# Patient Record
Sex: Female | Born: 1955 | Race: White | Hispanic: No | Smoking: Former smoker
Health system: Southern US, Community
[De-identification: ages and names within clinical notes are randomized; demographics above are authoritative.]

## PROBLEM LIST (undated history)

## (undated) DIAGNOSIS — N390 Urinary tract infection, site not specified: Secondary | ICD-10-CM

## (undated) DIAGNOSIS — E039 Hypothyroidism, unspecified: Secondary | ICD-10-CM

## (undated) DIAGNOSIS — F419 Anxiety disorder, unspecified: Secondary | ICD-10-CM

## (undated) DIAGNOSIS — F319 Bipolar disorder, unspecified: Secondary | ICD-10-CM

## (undated) DIAGNOSIS — F32A Depression, unspecified: Secondary | ICD-10-CM

## (undated) DIAGNOSIS — Z973 Presence of spectacles and contact lenses: Secondary | ICD-10-CM

## (undated) DIAGNOSIS — I00 Rheumatic fever without heart involvement: Secondary | ICD-10-CM

## (undated) DIAGNOSIS — F329 Major depressive disorder, single episode, unspecified: Secondary | ICD-10-CM

## (undated) DIAGNOSIS — J449 Chronic obstructive pulmonary disease, unspecified: Secondary | ICD-10-CM

## (undated) HISTORY — DX: Hypothyroidism, unspecified: E03.9

## (undated) HISTORY — DX: Presence of spectacles and contact lenses: Z97.3

## (undated) HISTORY — DX: Bipolar disorder, unspecified: F31.9

## (undated) HISTORY — DX: Anxiety disorder, unspecified: F41.9

## (undated) HISTORY — DX: Chronic obstructive pulmonary disease, unspecified: J44.9

## (undated) HISTORY — DX: Major depressive disorder, single episode, unspecified: F32.9

## (undated) HISTORY — DX: Rheumatic fever without heart involvement: I00

## (undated) HISTORY — DX: Urinary tract infection, site not specified: N39.0

## (undated) HISTORY — PX: UTERINE FIBROID SURGERY: SHX826

## (undated) HISTORY — PX: APPENDECTOMY: SHX54

## (undated) HISTORY — DX: Depression, unspecified: F32.A

## (undated) HISTORY — PX: ABDOMINAL HYSTERECTOMY: SHX81

---

## 1997-07-07 ENCOUNTER — Other Ambulatory Visit: Admission: RE | Admit: 1997-07-07 | Discharge: 1997-07-07 | Payer: Self-pay | Admitting: Obstetrics and Gynecology

## 1997-08-11 ENCOUNTER — Other Ambulatory Visit: Admission: RE | Admit: 1997-08-11 | Discharge: 1997-08-11 | Payer: Self-pay | Admitting: Obstetrics and Gynecology

## 1997-08-25 ENCOUNTER — Inpatient Hospital Stay (HOSPITAL_COMMUNITY): Admission: RE | Admit: 1997-08-25 | Discharge: 1997-08-26 | Payer: Self-pay | Admitting: Obstetrics and Gynecology

## 1999-03-07 ENCOUNTER — Other Ambulatory Visit: Admission: RE | Admit: 1999-03-07 | Discharge: 1999-03-07 | Payer: Self-pay | Admitting: Obstetrics and Gynecology

## 1999-05-09 ENCOUNTER — Encounter: Payer: Self-pay | Admitting: Urology

## 1999-05-10 ENCOUNTER — Observation Stay (HOSPITAL_COMMUNITY): Admission: RE | Admit: 1999-05-10 | Discharge: 1999-05-11 | Payer: Self-pay | Admitting: Urology

## 2000-04-17 ENCOUNTER — Other Ambulatory Visit: Admission: RE | Admit: 2000-04-17 | Discharge: 2000-04-17 | Payer: Self-pay | Admitting: Obstetrics and Gynecology

## 2001-10-16 ENCOUNTER — Other Ambulatory Visit: Admission: RE | Admit: 2001-10-16 | Discharge: 2001-10-16 | Payer: Self-pay | Admitting: Obstetrics and Gynecology

## 2004-03-25 DIAGNOSIS — J449 Chronic obstructive pulmonary disease, unspecified: Secondary | ICD-10-CM

## 2004-03-25 HISTORY — DX: Chronic obstructive pulmonary disease, unspecified: J44.9

## 2008-12-26 ENCOUNTER — Emergency Department (HOSPITAL_COMMUNITY): Admission: EM | Admit: 2008-12-26 | Discharge: 2008-12-27 | Payer: Self-pay | Admitting: Emergency Medicine

## 2008-12-27 ENCOUNTER — Inpatient Hospital Stay (HOSPITAL_COMMUNITY): Admission: RE | Admit: 2008-12-27 | Discharge: 2008-12-30 | Payer: Self-pay | Admitting: Psychiatry

## 2008-12-29 ENCOUNTER — Ambulatory Visit: Payer: Self-pay | Admitting: Psychiatry

## 2009-01-02 ENCOUNTER — Ambulatory Visit: Payer: Self-pay | Admitting: Psychiatry

## 2009-01-02 ENCOUNTER — Other Ambulatory Visit (HOSPITAL_COMMUNITY): Admission: RE | Admit: 2009-01-02 | Discharge: 2009-03-01 | Payer: Self-pay | Admitting: Psychiatry

## 2009-05-15 ENCOUNTER — Emergency Department (HOSPITAL_COMMUNITY): Admission: EM | Admit: 2009-05-15 | Discharge: 2009-05-16 | Payer: Self-pay | Admitting: Emergency Medicine

## 2009-08-31 ENCOUNTER — Emergency Department (HOSPITAL_COMMUNITY): Admission: EM | Admit: 2009-08-31 | Discharge: 2009-08-31 | Payer: Self-pay | Admitting: Emergency Medicine

## 2010-06-15 ENCOUNTER — Emergency Department (HOSPITAL_COMMUNITY): Payer: Self-pay

## 2010-06-15 ENCOUNTER — Emergency Department (HOSPITAL_COMMUNITY)
Admission: EM | Admit: 2010-06-15 | Discharge: 2010-06-15 | Disposition: A | Discharge status: Home / Self Care | Payer: Self-pay | Attending: Emergency Medicine | Admitting: Emergency Medicine

## 2010-06-15 DIAGNOSIS — E039 Hypothyroidism, unspecified: Secondary | ICD-10-CM | POA: Insufficient documentation

## 2010-06-15 DIAGNOSIS — S42023A Displaced fracture of shaft of unspecified clavicle, initial encounter for closed fracture: Secondary | ICD-10-CM | POA: Insufficient documentation

## 2010-06-15 DIAGNOSIS — J449 Chronic obstructive pulmonary disease, unspecified: Secondary | ICD-10-CM | POA: Insufficient documentation

## 2010-06-15 DIAGNOSIS — J4489 Other specified chronic obstructive pulmonary disease: Secondary | ICD-10-CM | POA: Insufficient documentation

## 2010-06-15 DIAGNOSIS — Z79899 Other long term (current) drug therapy: Secondary | ICD-10-CM | POA: Insufficient documentation

## 2010-06-15 DIAGNOSIS — F319 Bipolar disorder, unspecified: Secondary | ICD-10-CM | POA: Insufficient documentation

## 2010-06-15 DIAGNOSIS — M25519 Pain in unspecified shoulder: Secondary | ICD-10-CM | POA: Insufficient documentation

## 2010-06-15 LAB — DIFFERENTIAL
Basophils Absolute: 0 10*3/uL (ref 0.0–0.1)
Basophils Relative: 0 % (ref 0–1)
Eosinophils Relative: 3 % (ref 0–5)
Lymphocytes Relative: 27 % (ref 12–46)
Monocytes Absolute: 0.7 10*3/uL (ref 0.1–1.0)

## 2010-06-15 LAB — URINALYSIS, ROUTINE W REFLEX MICROSCOPIC
Bilirubin Urine: NEGATIVE
Ketones, ur: NEGATIVE mg/dL
Leukocytes, UA: NEGATIVE
Nitrite: NEGATIVE
Protein, ur: NEGATIVE mg/dL
Urobilinogen, UA: 0.2 mg/dL (ref 0.0–1.0)
pH: 6 (ref 5.0–8.0)

## 2010-06-15 LAB — COMPREHENSIVE METABOLIC PANEL
AST: 18 U/L (ref 0–37)
Albumin: 4.3 g/dL (ref 3.5–5.2)
Alkaline Phosphatase: 77 U/L (ref 39–117)
BUN: 8 mg/dL (ref 6–23)
CO2: 24 mEq/L (ref 19–32)
Chloride: 110 mEq/L (ref 96–112)
Creatinine, Ser: 0.96 mg/dL (ref 0.4–1.2)
GFR calc Af Amer: >60 mL/min (ref >60)
GFR calc non Af Amer: >60 mL/min (ref >60)
Potassium: 4 mEq/L (ref 3.5–5.1)
Total Bilirubin: 0.4 mg/dL (ref 0.3–1.2)

## 2010-06-15 LAB — CBC
HCT: 44.5 % (ref 36.0–46.0)
MCV: 93.8 fL (ref 78.0–100.0)
RBC: 4.75 MIL/uL (ref 3.87–5.11)
WBC: 10.9 10*3/uL — ABNORMAL HIGH (ref 4.0–10.5)

## 2010-06-15 LAB — RAPID URINE DRUG SCREEN, HOSP PERFORMED
Amphetamines: NOT DETECTED
Benzodiazepines: NOT DETECTED
Cocaine: NOT DETECTED
Tetrahydrocannabinol: NOT DETECTED

## 2010-06-15 LAB — LITHIUM LEVEL: Lithium Lvl: 0.62 mEq/L — ABNORMAL LOW (ref 0.80–1.40)

## 2010-06-15 LAB — ETHANOL: Alcohol, Ethyl (B): 128 mg/dL — ABNORMAL HIGH (ref 0–10)

## 2010-06-26 LAB — URINE DRUGS OF ABUSE SCREEN W ALC, ROUTINE (REF LAB)
Barbiturate Quant, Ur: NEGATIVE
Benzodiazepines.: NEGATIVE
Cocaine Metabolites: NEGATIVE
Creatinine,U: 23.8 mg/dL
Ethyl Alcohol: <10 mg/dL (ref <10)
Opiate Screen, Urine: NEGATIVE
Phencyclidine (PCP): NEGATIVE

## 2010-06-27 LAB — URINE DRUGS OF ABUSE SCREEN W ALC, ROUTINE (REF LAB)
Barbiturate Quant, Ur: NEGATIVE
Barbiturate Quant, Ur: NEGATIVE
Barbiturate Quant, Ur: NEGATIVE
Benzodiazepines.: NEGATIVE
Benzodiazepines.: NEGATIVE
Benzodiazepines.: NEGATIVE
Cocaine Metabolites: NEGATIVE
Cocaine Metabolites: NEGATIVE
Cocaine Metabolites: NEGATIVE
Creatinine,U: 25.8 mg/dL
Ethyl Alcohol: <10 mg/dL (ref <10)
Ethyl Alcohol: <10 mg/dL (ref <10)
Methadone: NEGATIVE
Methadone: NEGATIVE
Phencyclidine (PCP): NEGATIVE
Phencyclidine (PCP): NEGATIVE
Phencyclidine (PCP): NEGATIVE

## 2010-06-28 LAB — BASIC METABOLIC PANEL
BUN: 10 mg/dL (ref 6–23)
GFR calc non Af Amer: >60 mL/min (ref >60)
Potassium: 4 mEq/L (ref 3.5–5.1)

## 2010-06-28 LAB — HEPATIC FUNCTION PANEL
ALT: 15 U/L (ref 0–35)
Alkaline Phosphatase: 81 U/L (ref 39–117)
Bilirubin, Direct: <0.1 mg/dL (ref 0.0–0.3)
Total Bilirubin: 0.6 mg/dL (ref 0.3–1.2)

## 2010-06-28 LAB — RAPID URINE DRUG SCREEN, HOSP PERFORMED
Cocaine: NOT DETECTED
Tetrahydrocannabinol: NOT DETECTED

## 2010-06-28 LAB — URINALYSIS, ROUTINE W REFLEX MICROSCOPIC
Bilirubin Urine: NEGATIVE
Ketones, ur: NEGATIVE mg/dL
Nitrite: NEGATIVE
Urobilinogen, UA: 0.2 mg/dL (ref 0.0–1.0)
pH: 6 (ref 5.0–8.0)

## 2010-06-28 LAB — URINE DRUGS OF ABUSE SCREEN W ALC, ROUTINE (REF LAB)
Barbiturate Quant, Ur: NEGATIVE
Benzodiazepines.: NEGATIVE
Benzodiazepines.: NEGATIVE
Cocaine Metabolites: NEGATIVE
Ethyl Alcohol: <10 mg/dL (ref <10)
Methadone: NEGATIVE
Opiate Screen, Urine: NEGATIVE
Phencyclidine (PCP): NEGATIVE
Phencyclidine (PCP): NEGATIVE

## 2010-06-28 LAB — CBC
HCT: 41.1 % (ref 36.0–46.0)
Platelets: 261 10*3/uL (ref 150–400)
WBC: 9.8 10*3/uL (ref 4.0–10.5)

## 2010-06-28 LAB — ETHANOL: Alcohol, Ethyl (B): 52 mg/dL — ABNORMAL HIGH (ref 0–10)

## 2010-06-28 LAB — DIFFERENTIAL
Lymphocytes Relative: 27 % (ref 12–46)
Lymphs Abs: 2.6 10*3/uL (ref 0.7–4.0)
Neutrophils Relative %: 63 % (ref 43–77)

## 2012-03-13 ENCOUNTER — Other Ambulatory Visit: Payer: Self-pay | Admitting: Medical

## 2012-03-13 ENCOUNTER — Encounter: Payer: Self-pay | Admitting: Medical

## 2012-03-13 ENCOUNTER — Ambulatory Visit (HOSPITAL_COMMUNITY)
Admission: RE | Admit: 2012-03-13 | Discharge: 2012-03-13 | Disposition: A | Discharge status: Home / Self Care | Payer: Self-pay | Source: Ambulatory Visit | Attending: Medical | Admitting: Medical

## 2012-03-13 ENCOUNTER — Ambulatory Visit (INDEPENDENT_AMBULATORY_CARE_PROVIDER_SITE_OTHER): Payer: Self-pay | Admitting: Medical

## 2012-03-13 ENCOUNTER — Other Ambulatory Visit: Payer: Self-pay | Admitting: Family Medicine

## 2012-03-13 VITALS — BP 120/80 | HR 88 | Temp 98.4°F | Resp 18 | Ht 64.0 in | Wt 144.0 lb

## 2012-03-13 DIAGNOSIS — R05 Cough: Secondary | ICD-10-CM

## 2012-03-13 DIAGNOSIS — F17200 Nicotine dependence, unspecified, uncomplicated: Secondary | ICD-10-CM | POA: Insufficient documentation

## 2012-03-13 DIAGNOSIS — R042 Hemoptysis: Secondary | ICD-10-CM

## 2012-03-13 DIAGNOSIS — R52 Pain, unspecified: Secondary | ICD-10-CM

## 2012-03-13 DIAGNOSIS — R059 Cough, unspecified: Secondary | ICD-10-CM | POA: Insufficient documentation

## 2012-03-13 DIAGNOSIS — J449 Chronic obstructive pulmonary disease, unspecified: Secondary | ICD-10-CM

## 2012-03-13 DIAGNOSIS — Z79899 Other long term (current) drug therapy: Secondary | ICD-10-CM

## 2012-03-13 MED ORDER — AMOXICILLIN-POT CLAVULANATE 875-125 MG PO TABS: 1.0000 | ORAL_TABLET | Freq: Two times a day (BID) | ORAL | Status: DC

## 2012-03-13 NOTE — Progress Notes (Signed)
Subjective: Here as a new patient.  Been sick since Monday, thought she had the flu, but Tuesday and Wednesday night started coughing up blood, about a tsp of visible blood.  Having trouble breathing.  Having wheezing, can't cough up mucous, nothing wants to come up.   This is the first time she has ever coughed up blood.   Has had some low grade fever at night.  Awakening about 3am with coughing attack.  Been having some sweats.  Having sore throat, but no ear pain, sinus pressure.   She reports some hot feeling on the roof of her mouth x month.  No c/o weight loss, night sweats ongoing.  Denies lumps or masses.    Has hx/o COPD, not currently on medication for COPD, smokes 2ppd.  Sees Dr. Evelene Croon, psychiatry.  Past Medical History  Diagnosis Date  . Hypothyroidism   . COPD (chronic obstructive pulmonary disease) 2006  . Urinary tract infection     hx/o  . Wears glasses   . Bipolar disorder     Dr. Evelene Croon  . Depression   . Anxiety   . Rheumatic fever     as a child, age 26yo   ROS as in HPI  Objective:   Filed Vitals:   03/13/12 1020  BP: 120/80  Pulse: 88  Temp: 98.4 F (36.9 C)  Resp: 18    General appearance: Alert, WD/WN, no distress, ill appearing, white female                             Skin: warm, no rash, no diaphoresis                           Head: no sinus tenderness                            Eyes: conjunctiva normal, corneas clear, PERRLA                            Ears: pearly TMs, external ear canals normal                          Nose: septum midline, turbinates swollen, with erythema and no discharge             Mouth/throat: MMM, tongue normal, mild pharyngeal erythema                           Neck: supple, no adenopathy, no thyromegaly, nontender                          Heart: RRR, normal S1, S2, no murmurs                         Lungs: +bronchial breath sounds, +scattered rhonchi, no wheezes, no rales                Extremities: no edema,  nontender     Assessment and Plan:   Encounter Diagnoses  Name Primary?  . Cough Yes  . Hemoptysis   . COPD (chronic obstructive pulmonary disease)   . Tobacco use disorder   . High risk medication use    Will send for  CXR.  Antibiotics will be sent pending xray.   Sample Albuterol inhaler given with demonstration.   Advised rest, increase water intake, begin antibiotic pending CXR, begin albuterol, and recheck 2wk regarding hemoptysis unless worse in meantime.   strongly encouraged her to cut down or try and quit tobacco.  discussed potential causes of hemoptysis, and discussed worsening of COPD with her current rate of tobacco use.

## 2012-03-23 ENCOUNTER — Emergency Department (HOSPITAL_BASED_OUTPATIENT_CLINIC_OR_DEPARTMENT_OTHER): Payer: Self-pay

## 2012-03-23 ENCOUNTER — Encounter (HOSPITAL_BASED_OUTPATIENT_CLINIC_OR_DEPARTMENT_OTHER): Payer: Self-pay

## 2012-03-23 ENCOUNTER — Emergency Department (HOSPITAL_BASED_OUTPATIENT_CLINIC_OR_DEPARTMENT_OTHER)
Admission: EM | Admit: 2012-03-23 | Discharge: 2012-03-23 | Disposition: A | Discharge status: Home / Self Care | Payer: Self-pay | Attending: Emergency Medicine | Admitting: Emergency Medicine

## 2012-03-23 DIAGNOSIS — Z79899 Other long term (current) drug therapy: Secondary | ICD-10-CM | POA: Insufficient documentation

## 2012-03-23 DIAGNOSIS — Z87440 Personal history of urinary (tract) infections: Secondary | ICD-10-CM | POA: Insufficient documentation

## 2012-03-23 DIAGNOSIS — F329 Major depressive disorder, single episode, unspecified: Secondary | ICD-10-CM | POA: Insufficient documentation

## 2012-03-23 DIAGNOSIS — J40 Bronchitis, not specified as acute or chronic: Secondary | ICD-10-CM | POA: Insufficient documentation

## 2012-03-23 DIAGNOSIS — E039 Hypothyroidism, unspecified: Secondary | ICD-10-CM | POA: Insufficient documentation

## 2012-03-23 DIAGNOSIS — F3289 Other specified depressive episodes: Secondary | ICD-10-CM | POA: Insufficient documentation

## 2012-03-23 DIAGNOSIS — Z8679 Personal history of other diseases of the circulatory system: Secondary | ICD-10-CM | POA: Insufficient documentation

## 2012-03-23 DIAGNOSIS — R0602 Shortness of breath: Secondary | ICD-10-CM | POA: Insufficient documentation

## 2012-03-23 DIAGNOSIS — F411 Generalized anxiety disorder: Secondary | ICD-10-CM | POA: Insufficient documentation

## 2012-03-23 DIAGNOSIS — F17200 Nicotine dependence, unspecified, uncomplicated: Secondary | ICD-10-CM | POA: Insufficient documentation

## 2012-03-23 DIAGNOSIS — F319 Bipolar disorder, unspecified: Secondary | ICD-10-CM | POA: Insufficient documentation

## 2012-03-23 DIAGNOSIS — J4489 Other specified chronic obstructive pulmonary disease: Secondary | ICD-10-CM | POA: Insufficient documentation

## 2012-03-23 DIAGNOSIS — J449 Chronic obstructive pulmonary disease, unspecified: Secondary | ICD-10-CM | POA: Insufficient documentation

## 2012-03-23 MED ORDER — AZITHROMYCIN 250 MG PO TABS: ORAL_TABLET | ORAL | Status: DC

## 2012-03-23 MED ORDER — PREDNISONE 10 MG PO TABS: ORAL_TABLET | ORAL | Status: DC

## 2012-03-23 NOTE — ED Provider Notes (Signed)
History     CSN: [id number]  Arrival date & time 03/23/12  1429   First MD Initiated Contact with Patient 03/23/12 1547      Chief Complaint  Patient presents with  . Cough    (Consider location/radiation/quality/duration/timing/severity/associated sxs/prior treatment) Patient is a 56 y.o. female presenting with cough. The history is provided by the patient. No language interpreter was used.  Cough This is a new problem. The problem occurs constantly. The problem has been rapidly worsening. The cough is non-productive. There has been no fever. Associated symptoms include shortness of breath. She has tried nothing for the symptoms. The treatment provided no relief. She is a smoker. Her past medical history is significant for bronchitis, COPD and emphysema.  Pt was treated for bronchitis by primary care MD.   Pt has finished Augmentin and albuterol.  Pt reports cough is worse.   Pt reports unable to follow up due to cost.   Past Medical History  Diagnosis Date  . Hypothyroidism   . COPD (chronic obstructive pulmonary disease) 2006  . Urinary tract infection     hx/o  . Wears glasses   . Bipolar disorder     Dr. Evelene Croon  . Depression   . Anxiety   . Rheumatic fever     as a child, age 89yo    Past Surgical History  Procedure Date  . Appendectomy   . Abdominal hysterectomy     No family history on file.  History  Substance Use Topics  . Smoking status: Current Every Day Smoker -- 2.0 packs/day for 40 years  . Smokeless tobacco: Not on file  . Alcohol Use: Yes    OB History    Grav Para Term Preterm Abortions TAB SAB Ect Mult Living                  Review of Systems  Respiratory: Positive for cough and shortness of breath.   All other systems reviewed and are negative.    Allergies  Codeine  Home Medications   Current Outpatient Rx  Name  Route  Sig  Dispense  Refill  . AMOXICILLIN-POT CLAVULANATE 875-125 MG PO TABS   Oral   Take 1 tablet by mouth 2  (two) times daily.   20 tablet   0   . DIAZEPAM 5 MG PO TABS   Oral   Take 5 mg by mouth 3 (three) times daily.         Marland Kitchen LAMOTRIGINE 200 MG PO TABS   Oral   Take 200 mg by mouth 2 (two) times daily.         Marland Kitchen LEVOTHYROXINE SODIUM 75 MCG PO TABS   Oral   Take 0.75 mcg by mouth daily.         Marland Kitchen LITHIUM CARBONATE 300 MG PO CAPS   Oral   Take 675 mg by mouth 3 (three) times daily with meals.         Marland Kitchen OLANZAPINE-FLUOXETINE HCL 6-25 MG PO CAPS   Oral   Take 1 capsule by mouth every evening.           BP 119/73  Pulse 72  Temp 99.5 F (37.5 C) (Oral)  Resp 20  Ht 5\' 4"  (1.626 m)  Wt 144 lb (65.318 kg)  BMI 24.72 kg/m2  SpO2 98%  Physical Exam  Nursing note and vitals reviewed. Constitutional: She is oriented to person, place, and time. She appears well-developed and well-nourished.  HENT:  Head:  Normocephalic and atraumatic.  Eyes: Conjunctivae normal and EOM are normal. Pupils are equal, round, and reactive to light.  Neck: Normal range of motion. Neck supple.  Cardiovascular: Normal rate and normal heart sounds.   Pulmonary/Chest: Effort normal.       rhonchi  Abdominal: Soft.  Musculoskeletal: Normal range of motion.  Neurological: She is alert and oriented to person, place, and time. She has normal reflexes.  Skin: Skin is warm.  Psychiatric: She has a normal mood and affect.    ED Course  Procedures (including critical care time)  Labs Reviewed - No data to display Dg Chest 2 View  03/23/2012  *RADIOLOGY REPORT*  Clinical Data: Cough, congestion.  Difficulty breathing.  CHEST - 2 VIEW  Comparison: 03/13/2012  Findings: Heart and mediastinal contours are within normal limits. No focal opacities or effusions.  No acute bony abnormality.  IMPRESSION: No active cardiopulmonary disease.   Original Report Authenticated By: Charlett Nose, M.D.      No diagnosis found.    MDM  Pt given Rx for zithromax and prednisone.   Pt advised to follow up with  primary MD for recheck.   Pt advised she needs to stop smoking        Coty Student [place], [place] 03/23/12 [phone]

## 2012-03-23 NOTE — ED Notes (Signed)
Bronchitis and COPD ago-was seen by PCP-cpmpleted abx-c/o fever, nonprod cough, back pain-states she feels worse and was advised to f/u withPCP but can not afford another office visit

## 2012-03-24 NOTE — ED Provider Notes (Signed)
Medical screening examination/treatment/procedure(s) were performed by non-physician practitioner and as supervising physician I was immediately available for consultation/collaboration.   Sami Roes Y. Clotee Schlicker, MD 03/24/12 1059 

## 2013-07-08 ENCOUNTER — Telehealth: Payer: Self-pay | Admitting: Internal Medicine

## 2013-07-08 NOTE — Telephone Encounter (Signed)
I called spoke w/ pt. She will see MW 1st and then he can set her up with another provider in office if need be. She needed nothing further

## 2013-07-14 ENCOUNTER — Encounter: Payer: Self-pay | Admitting: Internal Medicine

## 2013-07-14 ENCOUNTER — Ambulatory Visit (INDEPENDENT_AMBULATORY_CARE_PROVIDER_SITE_OTHER): Payer: Medicare Other | Admitting: Internal Medicine

## 2013-07-14 VITALS — BP 114/66 | HR 90 | Temp 97.8°F | Ht 65.0 in | Wt 147.6 lb

## 2013-07-14 DIAGNOSIS — R0600 Dyspnea, unspecified: Secondary | ICD-10-CM

## 2013-07-14 DIAGNOSIS — J449 Chronic obstructive pulmonary disease, unspecified: Secondary | ICD-10-CM

## 2013-07-14 DIAGNOSIS — R0989 Other specified symptoms and signs involving the circulatory and respiratory systems: Secondary | ICD-10-CM

## 2013-07-14 DIAGNOSIS — R0609 Other forms of dyspnea: Secondary | ICD-10-CM

## 2013-07-14 DIAGNOSIS — F17200 Nicotine dependence, unspecified, uncomplicated: Secondary | ICD-10-CM

## 2013-07-14 MED ORDER — BUDESONIDE-FORMOTEROL FUMARATE 160-4.5 MCG/ACT IN AERO: INHALATION_SPRAY | RESPIRATORY_TRACT | Status: DC

## 2013-07-14 NOTE — Patient Instructions (Addendum)
Symbicort 160 Take 2 puffs first thing in am and then another 2 puffs about 12 hours later  Only use your albuterol (proair) as a rescue medication to be used if you can't catch your breath by resting or doing a relaxed purse lip breathing pattern.  - The less you use it, the better it will work when you need it. - Ok to use up to 2 puffs  every 4 hours if you must but call for immediate appointment if use goes up over your usual need - Don't leave home without it !!  (think of it like the spare tire for your car)   [facility] and Dr Delton CoombesByrum will read it   The key is to stop smoking completely before smoking completely stops you!   Pulmonary follow up is as needed - good luck!

## 2013-07-14 NOTE — Progress Notes (Signed)
   Subjective:    Patient ID: Misty CordsLeslie C Whitley, female    DOB: 06-01-1955 MRN: [id number]  HPI  [age] yowf smoker with indolent onset doe x 2013 in setting of chronic cough x 2005 requesting pfts for dmv - primary doctor is Antony HasteMichael Badger   07/14/2013 1st [facility] office visit/ [facility]  Chief Complaint  Patient presents with  . Pulmonary Consult    Self referral. Pt needs documentation for DMV stating unable to blow into the ingnition interlock system. She c/o SOB and cough "for 10 yrs" worse over the past couple of yrs.  She states that she gets out of breath walking up stairs and hills. Cough is minimal and non prod.   onset was insidious, progressive, stops walking due to sob after a block and has to stop at least x one when walks to [employer] from her residence.  Worse with hills or housework, not much variability  saba helps.  Some am cough mostly in ams worse in winter  Some better p proair  No obvious other patterns in day to day or daytime variabilty or assoc cp or chest tightness, subjective wheeze overt sinus or hb symptoms. No unusual exp hx or h/o childhood pna/ asthma or knowledge of premature birth.  Sleeping ok without nocturnal  or early am exacerbation  of respiratory  c/o's or need for noct saba. Also denies any obvious fluctuation of symptoms with weather or environmental changes or other aggravating or alleviating factors except as outlined above   Current Medications, Allergies, Complete Past Medical History, Past Surgical History, Family History, and Social History were reviewed in [facility] electronic medical record.           Review of Systems  Constitutional: Negative for fever, chills and unexpected weight change.  HENT: Positive for dental problem. Negative for congestion, ear pain, nosebleeds, postnasal drip, rhinorrhea, sinus pressure, sneezing, sore throat, trouble swallowing and voice change.   Eyes: Negative for visual disturbance.  Respiratory:  Positive for cough and shortness of breath. Negative for choking.   Cardiovascular: Negative for chest pain and leg swelling.  Gastrointestinal: Negative for vomiting, abdominal pain and diarrhea.  Genitourinary: Negative for difficulty urinating.  Musculoskeletal: Negative for arthralgias.  Skin: Negative for rash.  Neurological: Negative for tremors, syncope and headaches.  Hematological: Does not bruise/bleed easily.       Objective:   Physical Exam  amb wf   Wt Readings from Last 3 Encounters:  07/14/13 147 lb 9.6 oz (66.951 kg)  03/23/12 144 lb (65.318 kg)  03/13/12 144 lb (65.318 kg)     HEENT: nl dentition, turbinates, and orophanx. Nl external ear canals without cough reflex   NECK :  without JVD/Nodes/TM/ nl carotid upstrokes bilaterally   LUNGS: no acc muscle use, clear to A and P bilaterally without cough on insp or exp maneuvers   CV:  RRR  no s3 or murmur or increase in P2, no edema   ABD:  soft and nontender with nl excursion in the supine position. No bruits or organomegaly, bowel sounds nl  MS:  warm without deformities, calf tenderness, cyanosis or clubbing  SKIN: warm and dry without lesions    NEURO:  alert, approp, no deficits    Imaging studies reviewed, no recent cxr - last one 02/2012 nl      Assessment & Plan:

## 2013-07-16 ENCOUNTER — Ambulatory Visit (HOSPITAL_COMMUNITY)
Admission: RE | Admit: 2013-07-16 | Discharge: 2013-07-16 | Disposition: A | Discharge status: Home / Self Care | Payer: Medicare Other | Source: Ambulatory Visit | Attending: Internal Medicine | Admitting: Internal Medicine

## 2013-07-16 DIAGNOSIS — R0600 Dyspnea, unspecified: Secondary | ICD-10-CM

## 2013-07-16 DIAGNOSIS — J988 Other specified respiratory disorders: Secondary | ICD-10-CM | POA: Insufficient documentation

## 2013-07-16 DIAGNOSIS — F17200 Nicotine dependence, unspecified, uncomplicated: Secondary | ICD-10-CM | POA: Insufficient documentation

## 2013-07-16 MED ORDER — ALBUTEROL SULFATE (2.5 MG/3ML) 0.083% IN NEBU
2.5000 mg | INHALATION_SOLUTION | Freq: Once | RESPIRATORY_TRACT | Status: AC
  Administered 2013-07-16: 2.5 mg via RESPIRATORY_TRACT

## 2013-07-17 DIAGNOSIS — F17200 Nicotine dependence, unspecified, uncomplicated: Secondary | ICD-10-CM | POA: Insufficient documentation

## 2013-07-17 DIAGNOSIS — J449 Chronic obstructive pulmonary disease, unspecified: Secondary | ICD-10-CM | POA: Insufficient documentation

## 2013-07-17 NOTE — Assessment & Plan Note (Signed)

## 2013-07-17 NOTE — Assessment & Plan Note (Addendum)
-   Spirometry 07/14/13  1.68 (64%) ratio 60   Mild to moderate in pt with active smoking. Symptoms are markedly disproportionate to objective findings and not clear this is a lung problem but pt does appear to have difficult airway management issues. DDX of  difficult airways managment all start with A and  include Adherence, Ace Inhibitors, Acid Reflux, Active Sinus Disease, Alpha 1 Antitripsin deficiency, Anxiety masquerading as Airways dz,  ABPA,  allergy(esp in young), Aspiration (esp in elderly), Adverse effects of DPI,  Active smokers, plus two Bs  = Bronchiectasis and Beta blocker use..and one C= CHF  Adherence is always the initial "prime suspect" and is a multilayered concern that requires a "trust but verify" approach in every patient - starting with knowing how to use medications, especially inhalers, correctly, keeping up with refills and understanding the fundamental difference between maintenance and prns vs those medications only taken for a very short course and then stopped and not refilled.  The proper method of use, as well as anticipated side effects, of a metered-dose inhaler are discussed and demonstrated to the patient. Improved effectiveness after extensive coaching during this visit to a level of approximately  75% so try symbiocrt 160 2bid   Active smoking greatest concern > see smoking a/p  Anxiety/ deconditioning also a concern here

## 2013-07-19 ENCOUNTER — Telehealth: Payer: Self-pay | Admitting: Internal Medicine

## 2013-07-19 NOTE — Telephone Encounter (Signed)
Pt returned call & can be reached at [phone].  Misty FairyHolly D Pryor

## 2013-07-19 NOTE — Telephone Encounter (Signed)
Spoke with the pt  She states that [facility] Needs RB to fill out her form for DMV  This is per MW instruction on last AVS  I advised that she can drop this off and we will let her know once this is completed

## 2013-07-19 NOTE — Telephone Encounter (Signed)
LMTCBN

## 2013-07-21 ENCOUNTER — Telehealth: Payer: Self-pay | Admitting: Emergency Medicine

## 2013-07-21 NOTE — Telephone Encounter (Signed)
I have received these forms and will place for RB to look at and fill out when he is in the office next week (Wed) Pt aware not in office until next week and will contact her once these forms are complete as well as fax back to given #

## 2013-07-28 LAB — PULMONARY FUNCTION TEST
DL/VA % pred: 53 %
DL/VA: 2.62 ml/min/mmHg/L
DLCO UNC % PRED: 49 %
DLCO UNC: 12.63 ml/min/mmHg
FEF 25-75 POST: 0.88 L/s
FEF 25-75 PRE: 0.93 L/s
FEF2575-%Change-Post: -5 %
FEF2575-%Pred-Post: 34 %
FEF2575-%Pred-Pre: 37 %
FEV1-%Change-Post: -1 %
FEV1-%Pred-Post: 65 %
FEV1-%Pred-Pre: 66 %
FEV1-PRE: 1.81 L
FEV1-Post: 1.79 L
FEV1FVC-%Change-Post: 0 %
FEV1FVC-%Pred-Pre: 81 %
FEV6-%CHANGE-POST: 0 %
FEV6-%PRED-POST: 80 %
FEV6-%PRED-PRE: 81 %
FEV6-POST: 2.73 L
FEV6-Pre: 2.75 L
FEV6FVC-%CHANGE-POST: 0 %
FEV6FVC-%PRED-POST: 101 %
FEV6FVC-%Pred-Pre: 101 %
FVC-%CHANGE-POST: 0 %
FVC-%PRED-POST: 79 %
FVC-%PRED-PRE: 80 %
FVC-POST: 2.78 L
FVC-PRE: 2.8 L
PRE FEV1/FVC RATIO: 64 %
PRE FEV6/FVC RATIO: 98 %
Post FEV1/FVC ratio: 64 %
Post FEV6/FVC ratio: 98 %
RV % PRED: 102 %
RV: 2.05 L
TLC % pred: 96 %
TLC: 5.04 L

## 2013-07-29 NOTE — Telephone Encounter (Signed)
Advised pt that RB had filled out forms for motor vehicle and that I would fax them and copy of PFT. At pt's request mailing a copy to her home. Nothing further is needed

## 2013-08-06 ENCOUNTER — Telehealth: Payer: Self-pay | Admitting: Emergency Medicine

## 2013-08-06 NOTE — Telephone Encounter (Signed)
Pt states that DMV is not wanting to accept her forms that were filled out/signed by Misty [name].  Pt saw Misty [name] on 07/14/13 as a Pulm Consult--PFT was ordered by Misty [name] but was specified on AVS 07/14/13 that Misty [name] would be reading and resulting them.   Misty [name] read PFT 07/29/13 and form was filled out and [facility]  Per pt, DMV is not wanting to accept the documentation because the PFT has Misty [name] name as ordering/authorizing physician but Misty [name] is the one that filled out her paperwork.   Spoke with Lakrish @ DMV [phone]. Explained why there are 2 different physicians and she states that she will have to have Phot[employer]lock Manager contact our office regarding this information. Will await call.

## 2013-08-09 NOTE — Telephone Encounter (Signed)
Spoke with pt who was [facility] that she need to be seen by RB and have spiro done with him due to the fact that the previous order was not order and signed by him was ordered by MW and signed by RB. Pt needing spiro ordered by RB and signed by RB. Appointment has been scheduled for 08/17/13 @ 3:30 and pt is aware. She has extension till 08/28/13 for these forms to [facility]

## 2013-08-09 NOTE — Telephone Encounter (Signed)
Patient called our office to make an appt for a spirometry with dr Delton CoombesByrum. Pt has never seen byrum, Please call patient back in regards to this.

## 2013-08-11 ENCOUNTER — Ambulatory Visit: Payer: Medicare Other | Admitting: Internal Medicine

## 2013-08-17 ENCOUNTER — Ambulatory Visit (INDEPENDENT_AMBULATORY_CARE_PROVIDER_SITE_OTHER): Payer: Medicare Other | Admitting: Emergency Medicine

## 2013-08-17 ENCOUNTER — Encounter: Payer: Self-pay | Admitting: Emergency Medicine

## 2013-08-17 VITALS — BP 128/86 | HR 77 | Ht 65.0 in | Wt 150.0 lb

## 2013-08-17 DIAGNOSIS — J449 Chronic obstructive pulmonary disease, unspecified: Secondary | ICD-10-CM

## 2013-08-17 NOTE — Patient Instructions (Signed)
Your spirometry today shows airflow obstruction with moderate to severe decrease in your lung function.  You can get and use the Ignition Interlock System as long as it is adjusted to take your decreased lung function into account.  You need to stop smoking Continue your inhaled medications as directed by Dr Sherene Sires and follow with him as planned.  Call us sooner if you have any problems or questions.

## 2013-08-17 NOTE — Assessment & Plan Note (Signed)
Confirmatory spirometry 08/17/13 shows moderately severe obstruction, FEV1 58% predicted.  - will send copy of the spirometry with the patient to go to the [facility] - discussed smoking cessation - f/u with Dr Sherene Sires as planned.

## 2013-08-17 NOTE — Progress Notes (Signed)
HPI:  58 yo woman, smoker with COPD. Also allergies. Presents today for independent spirometry to be sent to [facility] to determine whether she can use breathalyzer lock on her car. She had spirometry > confirms moderate to severe obstruction, FEV1 58 %. Will send her with a copy.   Past Medical History  Diagnosis Date  . Hypothyroidism   . COPD (chronic obstructive pulmonary disease) 2006  . Urinary tract infection     hx/o  . Wears glasses   . Bipolar disorder     Dr. Evelene Croon  . Depression   . Anxiety   . Rheumatic fever     as a child, age 74yo     Family History  Problem Relation Age of Onset  . Allergies Sister   . Asthma Sister   . Heart disease Mother   . Leukemia Mother      History   Social History  . Marital Status: Divorced    Spouse Name: N/A    Number of Children: N/A  . Years of Education: N/A   Occupational History  . Disabled    Social History Main Topics  . Smoking status: Current Every Day Smoker -- 0.25 packs/day for 40 years    Types: Cigarettes  . Smokeless tobacco: Not on file     Comment: using Vaper cig 07/14/13//lmr  . Alcohol Use: Yes  . Drug Use: No  . Sexual Activity: Not on file   Other Topics Concern  . Not on file   Social History Narrative  . No narrative on file     Allergies  Allergen Reactions  . Codeine Nausea Only and Other (See Comments)    "skin crawling"     Outpatient Prescriptions Prior to Visit  Medication Sig Dispense Refill  . budesonide-formoterol (SYMBICORT) 160-4.5 MCG/ACT inhaler Take 2 puffs first thing in am and then another 2 puffs about 12 hours later.  1 Inhaler  11  . diazepam (VALIUM) 5 MG tablet Take 5 mg by mouth 3 (three) times daily.      Marland Kitchen lamoTRIgine (LAMICTAL) 200 MG tablet Take 200 mg by mouth 2 (two) times daily.      Marland Kitchen levothyroxine (SYNTHROID, LEVOTHROID) 75 MCG tablet Take 0.75 mcg by mouth daily.      Marland Kitchen lithium carbonate 300 MG capsule Take 675 mg by mouth 3 (three) times daily with meals.       Marland Kitchen olanzapine-FLUoxetine (SYMBYAX) 6-25 MG per capsule Take 1 capsule by mouth every evening.       No facility-administered medications prior to visit.   Filed Vitals:   08/17/13 1530  BP: 128/86  Pulse: 77  Height: 5\' 5"  (1.651 m)  Weight: 150 lb (68.04 kg)  SpO2: 97%   Gen: Pleasant, well-nourished, in no distress,  normal affect  ENT: No lesions,  mouth clear,  oropharynx clear, no postnasal drip  Neck: No JVD, no TMG, no carotid bruits  Lungs: No use of accessory muscles, clear without rales or rhonchi  Cardiovascular: RRR, heart sounds normal, no murmur or gallops, no peripheral edema  Musculoskeletal: No deformities, no cyanosis or clubbing  Neuro: alert, non focal  Skin: Warm, no lesions or rashes  COPD GOLD II Confirmatory spirometry 08/17/13 shows moderately severe obstruction, FEV1 58% predicted.  - will send copy of the spirometry with the patient to go to the [facility] - discussed smoking cessation - f/u with Dr Sherene Sires as planned.

## 2014-04-15 IMAGING — CR DG CHEST 2V
2 series · 2 of 2 positions shown · non-contrast
Comparison: None.

CLINICAL DATA: Hemoptysis, smoker, cough

CHEST - 2 VIEW

[w chest pa]
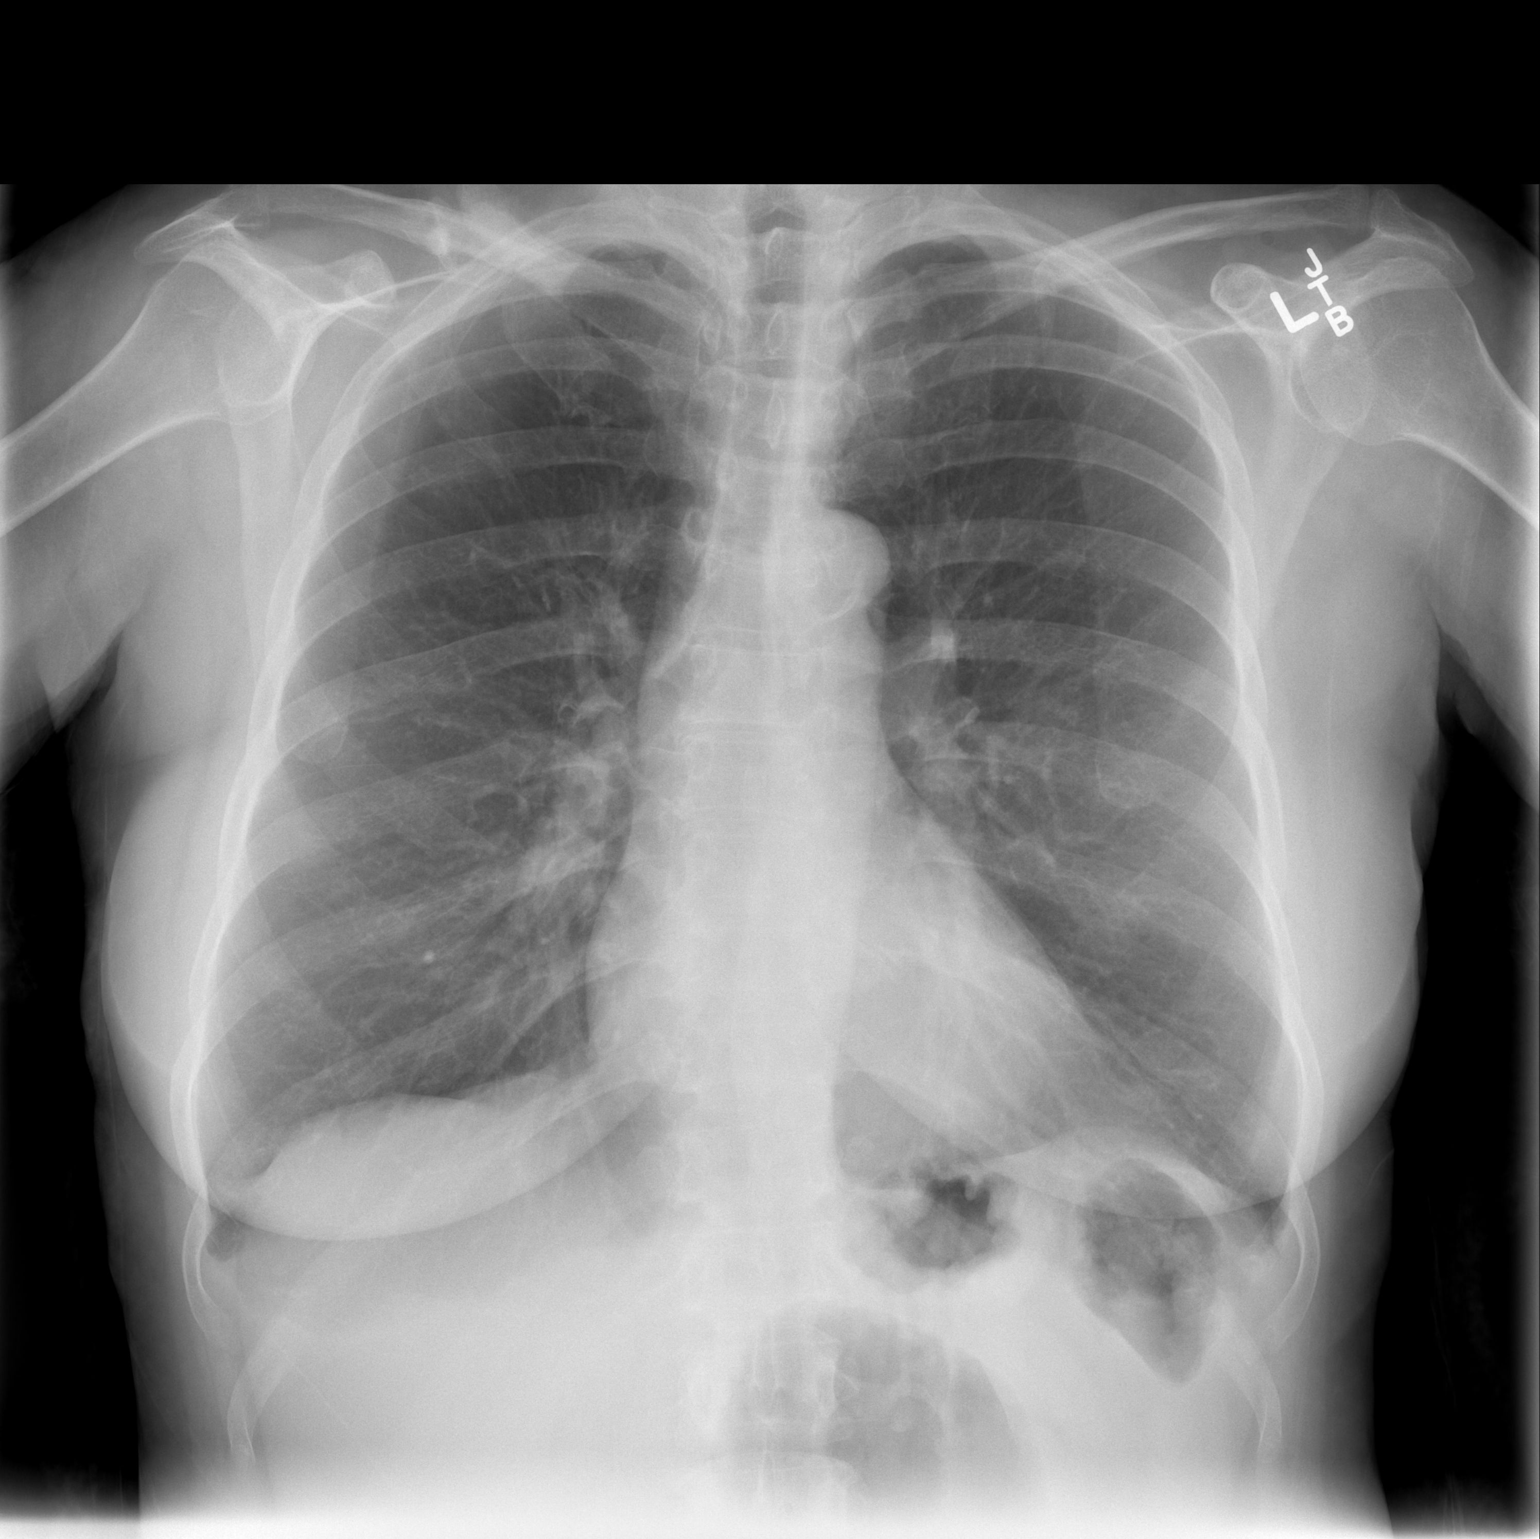

[w chest lat]
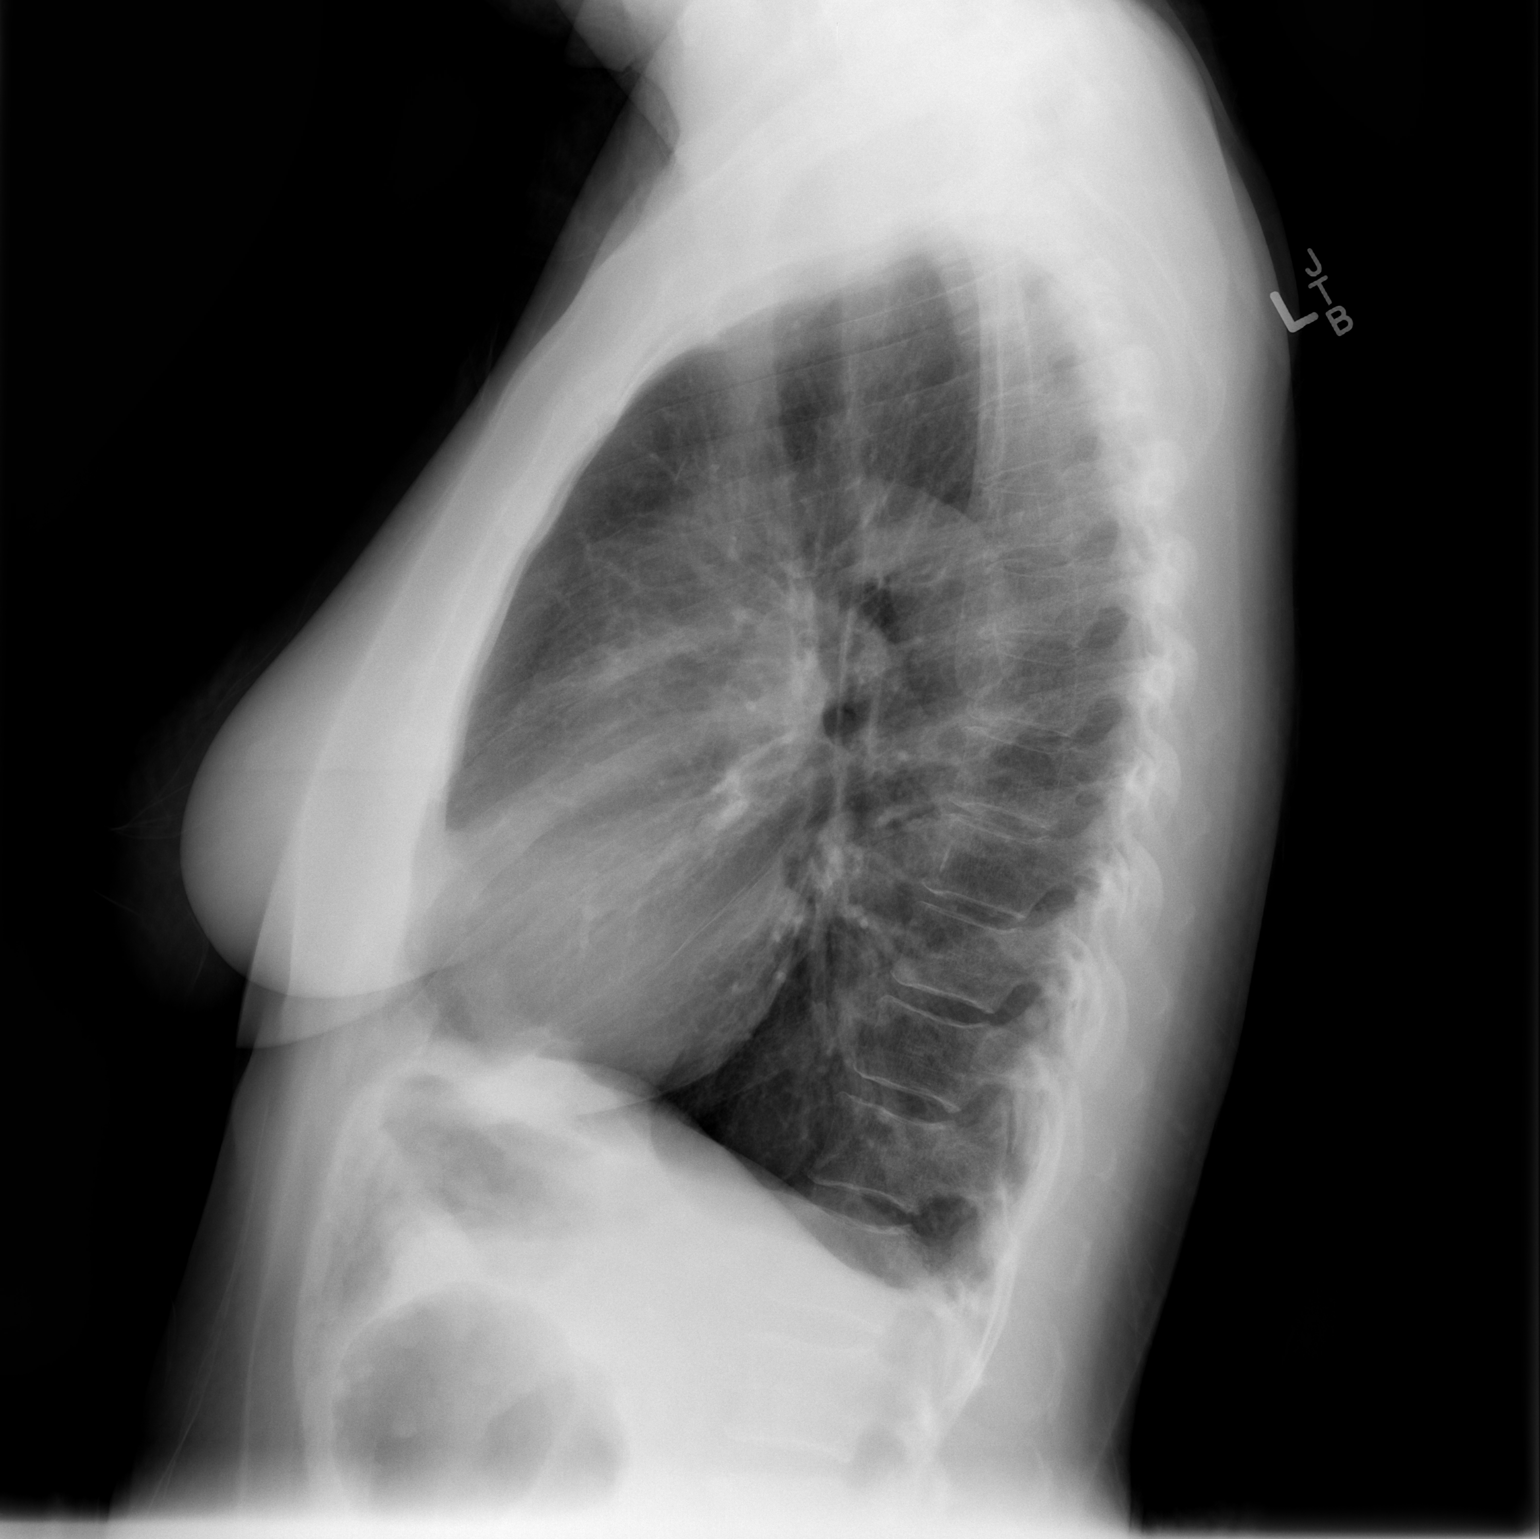

[2 of 2 positions shown; findings below may reference images not displayed]

FINDINGS: Cardiomediastinal silhouette is unremarkable.  No acute
infiltrate or pleural effusion.  No pulmonary edema.  Minimal
degenerative changes mid thoracic spine.
IMPRESSION: No active disease.

## 2014-09-24 ENCOUNTER — Emergency Department (HOSPITAL_COMMUNITY)
Admission: EM | Admit: 2014-09-24 | Discharge: 2014-09-25 | Disposition: A | Discharge status: Home / Self Care | Payer: Medicare Other | Attending: Emergency Medicine | Admitting: Emergency Medicine

## 2014-09-24 ENCOUNTER — Emergency Department (HOSPITAL_COMMUNITY): Payer: Medicare Other

## 2014-09-24 ENCOUNTER — Encounter (HOSPITAL_COMMUNITY): Payer: Self-pay | Admitting: Emergency Medicine

## 2014-09-24 DIAGNOSIS — Z87440 Personal history of urinary (tract) infections: Secondary | ICD-10-CM | POA: Insufficient documentation

## 2014-09-24 DIAGNOSIS — J449 Chronic obstructive pulmonary disease, unspecified: Secondary | ICD-10-CM | POA: Insufficient documentation

## 2014-09-24 DIAGNOSIS — Y9389 Activity, other specified: Secondary | ICD-10-CM | POA: Diagnosis not present

## 2014-09-24 DIAGNOSIS — S92352A Displaced fracture of fifth metatarsal bone, left foot, initial encounter for closed fracture: Secondary | ICD-10-CM | POA: Insufficient documentation

## 2014-09-24 DIAGNOSIS — Z973 Presence of spectacles and contact lenses: Secondary | ICD-10-CM | POA: Insufficient documentation

## 2014-09-24 DIAGNOSIS — Y929 Unspecified place or not applicable: Secondary | ICD-10-CM | POA: Diagnosis not present

## 2014-09-24 DIAGNOSIS — E039 Hypothyroidism, unspecified: Secondary | ICD-10-CM | POA: Diagnosis not present

## 2014-09-24 DIAGNOSIS — S92302A Fracture of unspecified metatarsal bone(s), left foot, initial encounter for closed fracture: Secondary | ICD-10-CM

## 2014-09-24 DIAGNOSIS — S99922A Unspecified injury of left foot, initial encounter: Secondary | ICD-10-CM | POA: Diagnosis present

## 2014-09-24 DIAGNOSIS — X58XXXA Exposure to other specified factors, initial encounter: Secondary | ICD-10-CM | POA: Diagnosis not present

## 2014-09-24 DIAGNOSIS — Z720 Tobacco use: Secondary | ICD-10-CM | POA: Insufficient documentation

## 2014-09-24 DIAGNOSIS — F319 Bipolar disorder, unspecified: Secondary | ICD-10-CM | POA: Diagnosis not present

## 2014-09-24 DIAGNOSIS — Y998 Other external cause status: Secondary | ICD-10-CM | POA: Insufficient documentation

## 2014-09-24 DIAGNOSIS — Z79899 Other long term (current) drug therapy: Secondary | ICD-10-CM | POA: Diagnosis not present

## 2014-09-24 DIAGNOSIS — F419 Anxiety disorder, unspecified: Secondary | ICD-10-CM | POA: Insufficient documentation

## 2014-09-24 NOTE — ED Notes (Signed)
Pt. missed her step and " twisted" and injured her left foot this evening , presents with pain / swelling .

## 2014-09-24 NOTE — ED Provider Notes (Signed)
CSN: [id number]     Arrival date & time 09/24/14  2104 History  This chart was scribed for non-physician practitioner, Oswaldo Conroy, PA-C, working with Rolland Porter, MD, by Ronney Lion, ED Scribe. This patient was seen in room TR10C/TR10C and the patient's care was started at 11:54 PM.    Chief Complaint  Patient presents with  . Foot Pain   The history is provided by the patient. No language interpreter was used.     HPI Comments: Misty [name] is a 59 y.o. female who presents to the Emergency Department complaining of constant, moderate left foot pain after mis-stepping and twisting her foot about 5 hours ago. Stepping on it and touching it exacerbates the pain. Patient states she had broken her right foot in the recent past, for which she is being treated by podiatrist Dr. Zachary George and has a boot for. She has taken Vicodin for her broken foot in the past with significant relief. She denies fever, chills, numbness, or tingling.   Past Medical History  Diagnosis Date  . Hypothyroidism   . COPD (chronic obstructive pulmonary disease) 2006  . Urinary tract infection     hx/o  . Wears glasses   . Bipolar disorder     Dr. Evelene Croon  . Depression   . Anxiety   . Rheumatic fever     as a child, age 62yo   Past Surgical History  Procedure Laterality Date  . Appendectomy    . Abdominal hysterectomy    . Cesarean section    . Uterine fibroid surgery     Family History  Problem Relation Age of Onset  . Allergies Sister   . Asthma Sister   . Heart disease Mother   . Leukemia Mother    History  Substance Use Topics  . Smoking status: Current Every Day Smoker -- 0.25 packs/day for 40 years    Types: Cigarettes  . Smokeless tobacco: Not on file     Comment: using Vaper cig 07/14/13//lmr  . Alcohol Use: Yes   OB History    No data available     Review of Systems  Constitutional: Negative for fever and chills.  Musculoskeletal: Positive for myalgias (left foot pain).  Negative for arthralgias.  Neurological: Negative for weakness and numbness.    Allergies  Codeine  Home Medications   Prior to Admission medications   Medication Sig Start Date End Date Taking? Authorizing Provider  budesonide-formoterol (SYMBICORT) 160-4.5 MCG/ACT inhaler Take 2 puffs first thing in am and then another 2 puffs about 12 hours later. 07/14/13   Nyoka Cowden, MD  diazepam (VALIUM) 5 MG tablet Take 5 mg by mouth 3 (three) times daily.    Historical Provider, MD  HYDROcodone-acetaminophen (NORCO/VICODIN) 5-325 MG per tablet Take 1 tablet by mouth every 6 (six) hours as needed. 09/25/14   Oswaldo Conroy, PA-C  lamoTRIgine (LAMICTAL) 200 MG tablet Take 200 mg by mouth 2 (two) times daily.    Historical Provider, MD  levothyroxine (SYNTHROID, LEVOTHROID) 75 MCG tablet Take 0.75 mcg by mouth daily.    Historical Provider, MD  lithium carbonate 300 MG capsule Take 675 mg by mouth 3 (three) times daily with meals.    Historical Provider, MD  olanzapine-FLUoxetine (SYMBYAX) 6-25 MG per capsule Take 1 capsule by mouth every evening.    Historical Provider, MD   BP 121/60 mmHg  Pulse 69  Temp(Src) 97.7 F (36.5 C) (Oral)  Resp 20  SpO2 96% Physical Exam  Constitutional:  She appears well-developed and well-nourished. No distress.  HENT:  Head: Normocephalic and atraumatic.  Eyes: Conjunctivae are normal. Right eye exhibits no discharge. Left eye exhibits no discharge.  Cardiovascular:  Pulses:      Dorsalis pedis pulses are 2+ on the right side, and 2+ on the left side.       Posterior tibial pulses are 2+ on the right side, and 2+ on the left side.  Pulmonary/Chest: Effort normal. No respiratory distress.  Musculoskeletal: She exhibits tenderness.  2+ DP and PT pulses, equal bilaterally.  LLE: Swelling distal to left ankle joint, lateral aspect, without erythema. TTP to dorsal aspect of fifth metatarsal. Sensation intact. 5/5 strength in left lower extremity.    Neurological: She is alert. Coordination normal.  Skin: She is not diaphoretic.  Psychiatric: She has a normal mood and affect. Her behavior is normal.  Nursing note and vitals reviewed.   ED Course  Procedures (including critical care time)  DIAGNOSTIC STUDIES: Oxygen Saturation is 96% on RA, normal by my interpretation.    COORDINATION OF CARE: 11:56 PM - Discussed treatment plan with pt at bedside which includes placement of boot and f/u with podiatrist, and pt agreed to plan.   Imaging Review Dg Foot Complete Left  09/24/2014   CLINICAL DATA:  Status post fall last night with pain and swelling near the fifth metatarsal base.  EXAM: LEFT FOOT - COMPLETE 3+ VIEW  COMPARISON:  None.  FINDINGS: There is mild displaced fracture at the base of the fifth metatarsal. There is no dislocation.  IMPRESSION: Fracture at the base of fifth metatarsal.   Electronically Signed   By: Sherian ReinWei-Chen  Lin M.D.   On: 09/24/2014 22:00   MDM   Final diagnoses:  Fracture of fifth metatarsal bone, left, closed, initial encounter   Patient presenting with fracture of fifth metatarsal on left with mild placement. Neurovascularly intact. No evidence of infection. Patient placed in a cam boot and is to follow-up with orthopedics or her podiatrist.  Discussed return precautions with patient. Discussed all results and patient verbalizes understanding and agrees with plan.  I personally performed the services described in this documentation, which was scribed in my presence. The recorded information has been reviewed and is accurate.    Oswaldo ConroyVictoria Devera Englander, PA-C 09/25/14 0103  Rolland PorterMark James, MD 09/29/14 0000

## 2014-09-25 MED ORDER — HYDROCODONE-ACETAMINOPHEN 5-325 MG PO TABS: 1.0000 | ORAL_TABLET | Freq: Four times a day (QID) | ORAL | Status: DC | PRN

## 2014-09-25 MED ORDER — HYDROCODONE-ACETAMINOPHEN 5-325 MG PO TABS
1.0000 | ORAL_TABLET | Freq: Once | ORAL | Status: AC
  Administered 2014-09-25: 1 via ORAL
  Filled 2014-09-25: qty 1

## 2014-09-25 NOTE — ED Notes (Signed)
Pt verbalizes understanding of d/c instructions and denies any further needs at this time. 

## 2014-09-25 NOTE — Discharge Instructions (Signed)
Return to the emergency room with worsening of symptoms, new symptoms or with symptoms that are concerning, especially fevers, redness, swelling, numbness, tingling, weakness. RICE: Rest, Ice (three cycles of 20 mins on, off at least twice a day), compression/brace, elevation. Heating pad works well for back pain. Ibuprofen  (2 tablets ) every 5-6 hours for 3-5 days. Norco for severe pain. Do not operate machinery, drive or drink alcohol while taking narcotics or muscle relaxers. Follow up with your orthopedist or the one above. Metatarsal Fracture  with Rehab A metatarsal fracture is a break (fracture) of one of the bones of the mid-foot (metatarsal bones). The metatarsal bones are responsible for maintaining the arch of the foot. There are three classifications of metatarsal fractures: dancer's fractures, Jones fractures, and stress fractures. A dancer's fracture is when a piece of bone is pulled off by a ligament or tendon (avulsion fracture) of the outer part of the foot (fifth metatarsal), near the joint with the ankle bones. A Jones fracture occurs in the middle of the fifth metatarsal. These fractures have limited ability to heal. A stress fracture occurs when the bone is slowly injured faster than it can repair itself. SYMPTOMS   Sharp pain, especially with standing or walking.  Tenderness, swelling, and later bruising (contusion) of the foot.  Numbness or paralysis from swelling in the foot, causing pressure on the blood vessels or nerves (uncommon). CAUSES  Fractures occur when a force is placed on the bone that is greater than it can handle. Common causes of injury include:  Direct hit (trauma) to the foot.  Twisting injury to the foot or ankle.  Landing on the foot and ankle in an improper position. RISK INCRESES WITH:  Participation in contact sports, sports that require jumping and landing, or sports in which cleats are worn and sliding occurs.  Previous foot  or ankle sprains or dislocations.  Repeated injury to any joint in the foot.  Poor strength and flexibility. PREVENTION  Warm up and stretch properly before an activity.  Allow for adequate recovery between workouts.  Maintain physical fitness in:  Strength, flexibility, and endurance.  Cardiovascular fitness.  When participating in jumping or contact sports, protect joints with supportive devices, such as wrapped elastic bandages, tape, braces, or high-top athletic shoes.  Wear properly fitted and padded protective equipment. PROGNOSIS If treated properly, metatarsal fractures usually heal well. Jones fractures have a higher risk of the bone failing to heal (nonunion). Sometimes, surgery is needed to heal Jones fractures.  RELATED COMPLICATIONS   Nonunion.  Fracture heals in a poor position (malunion).  Long-term (chronic) pain, stiffness, or swelling of the foot.  Excessive bleeding in the foot or at the dislocation site, causing pressure and injury to nerves and blood vessels (rare).  Unstable or arthritic joint following repeated injury or delayed treatment. TREATMENT  Treatment first involves the use of ice and medicine, to reduce pain and inflammation. If the bone fragments are out of alignment (displaced), then immediate realigning of the bones (reduction) is required. Fractures that cannot be realigned by hand, or where the bones protrude through the skin (open), may require surgery to hold the fracture in place with screws, pins, and plates. After the bones are in proper alignment, the foot and ankle must be restrained for 6 or more weeks. Restraint allows healing to occur. After restraint, it is important to perform strengthening and stretching exercises to help regain strength and a full range of motion. These exercises may be  completed at home or with a therapist. A stiff-soled shoe and arch support (orthotic) may be required when first returning to sports. MEDICATION     If pain medicine is needed, nonsteroidal anti-inflammatory medicines (NSAIDS), or other minor pain relievers, are often advised.  Do not take pain medicine for 7 days before surgery.  Only take over-the-counter or prescription medicines for pain, fever, or discomfort as directed by your caregiver. COLD THERAPY  Cold treatment (icing) should be applied for 10 to 15 minutes every 2 to 3 hours for inflammations and pain, and immediately after activity that aggravates your symptoms. Use ice packs or ice massage. SEEK MEDICAL CARE IF:  Pain, tenderness, or swelling gets worse, despite treatment.  You experience pain, numbness, or coldness in the foot.  Blue, gray, or dark color appears in the toenails.  You or your child has an oral temperature above 102 F (38.9 C).  You have increased pain, swelling, and redness.  You have drainage of fluids or bleeding in the affected area.  New, unexplained symptoms develop. (Drugs used in treatment may produce side effects.) EXERCISES RANGE OF MOTION (ROM) AND STRETCHING EXERCISES - Metatarsal Fracture (including Jones and Dancer's Fractures) These exercises may help you when beginning to rehabilitate your injury. Your symptoms may resolve with or without further involvement from your physician, physical therapist, or athletic trainer. While completing these exercises, remember:   Restoring tissue flexibility helps normal motion to return to the joints. This allows healthier, less painful movement and activity.  An effective stretch should be held for at least 30 seconds. A stretch should never be painful. You should only feel a gentle lengthening or release in the stretched. RANGE OF MOTION - Dorsi/Plantar Flexion  While sitting with your right / left knee straight, draw the top of your foot upwards, by flexing your ankle. Then reverse the motion, pointing your toes downward.  Hold each position for __________ seconds.  After completing your  first set of exercises, repeat this exercise with your knee bent. Repeat __________ times. Complete this exercise __________ times per day.  RANGE OF MOTION - Ankle Alphabet  Imagine your right / left big toe is a pen.  Keeping your hip and knee still, write out the entire alphabet with your "pen." Make the letters as large as you can, without increasing any discomfort. Repeat __________ times. Complete this exercise __________ times per day.  STRETCH - Gastroc, Standing  Place your hands on a wall.  Extend your right / left leg behind you, keeping the front knee somewhat bent.  Slightly point your toes inward on your back foot.  Keeping your right / left heel on the floor and your knee straight, shift your weight toward the wall, not allowing your back to arch.  You should feel a gentle stretch in the right / left calf. Hold this position for __________ seconds. Repeat __________ times. Complete this stretch __________ times per day. STRETCH - Soleus, Standing   Place your hands on a wall.  Extend your right / left leg behind you, keeping the other knee somewhat bent.  Slightly point your toes inward on your back foot.  Keep your right / left heel on the floor, bend your back knee, and slightly shift your weight over the back leg so that you feel a gentle stretch deep in your back calf.  Hold this position for __________ seconds. Repeat __________ times. Complete this stretch __________ times per day. STRENGTHENING EXERCISES - Metatarsal Fracture (Including Yetta Barre  and Dancer's Fractures) These exercises may help you when beginning to rehabilitate your injury. They may resolve your symptoms with or without further involvement from your physician, physical therapist, or athletic trainer. While completing these exercises, remember:   Muscles can gain both the endurance and the strength needed for everyday activities through controlled exercises.  Complete these exercises as  instructed by your physician, physical therapist or athletic trainer. Increase the resistance and repetitions only as guided by your caregiver. STRENGTH - Dorsiflexors  Secure a rubber exercise band or tubing to a fixed object (table, pole) and loop the other end around your right / left foot.  Sit on the floor facing the fixed object. The band should be slightly tense when your foot is relaxed.  Slowly draw your foot back toward you, using your ankle and toes.  Hold this position for __________ seconds. Slowly release the tension in the band and return your foot to the starting position. Repeat __________ times. Complete this exercise __________ times per day.  STRENGTH - Plantar-flexors   Sit with your right / left leg extended. Holding onto both ends of a rubber exercise band or tubing, loop it around the ball of your foot. Keep a slight tension in the band.  Slowly push your toes away from you, pointing them downward.  Hold this position for __________ seconds. Return slowly, controlling the tension in the band. Repeat __________ times. Complete this exercise __________ times per day.  STRENGTH - Plantar-flexors  Stand with your feet shoulder width apart. Steady yourself with a wall or table, using as little support as needed.  Keeping your weight evenly spread over the width of your feet, rise up on your toes.*  Hold this position for __________ seconds. Repeat __________ times. Complete this exercise __________ times per day.  *If this is too easy, shift your weight toward your right / left leg until you feel challenged. Ultimately, you may be asked to do this exercise while standing on your right / left foot only. STRENGTH - Towel Curls  Sit in a chair, on a non-carpeted surface.  Place your foot on a towel, keeping your heel on the floor.  Pull the towel toward your heel only by curling your toes. Keep your heel on the floor.  If instructed by your physician, physical  therapist, or athletic trainer, weight may be added at the end of the towel. Repeat __________ times. Complete this exercise __________ times per day. STRENGTH - Ankle Eversion  Secure one end of a rubber exercise band or tubing to a fixed object (table, pole). Loop the other end around your foot, just before your toes.  Place your fists between your knees. This will focus your strengthening at your ankle.  Drawing the band across your opposite foot, away from the pole, slowly, pull your little toe out and up. Make sure the band is positioned to resist the entire motion.  Hold this position for __________ seconds.  Have your muscles resist the band, as it slowly pulls your foot back to the starting position. Repeat __________ times. Complete this exercise __________ times per day.  STRENGTH - Ankle Inversion  Secure one end of a rubber exercise band or tubing to a fixed object (table, pole). Loop the other end around your foot, just before your toes.  Place your fists between your knees. This will focus your strengthening at your ankle.  Slowly, pull your big toe up and in, making sure the band is positioned to resist  the entire motion.  Hold this position for __________ seconds.  Have your muscles resist the band, as it slowly pulls your foot back to the starting position. Repeat __________ times. Complete this exercises __________ times per day.  Document Released: 03/11/2005 Document Revised: 06/03/2011 Document Reviewed: 06/24/2013 [facility] Patient Information 2015 [place], [place]. This information is not intended to replace advice given to you by your health care provider. Make sure you discuss any questions you have with your health care provider.

## 2014-11-27 ENCOUNTER — Emergency Department (HOSPITAL_COMMUNITY)
Admission: EM | Admit: 2014-11-27 | Discharge: 2014-11-27 | Disposition: A | Discharge status: Home / Self Care | Payer: Medicare Other | Attending: Emergency Medicine | Admitting: Emergency Medicine

## 2014-11-27 ENCOUNTER — Encounter (HOSPITAL_COMMUNITY): Payer: Self-pay | Admitting: Emergency Medicine

## 2014-11-27 ENCOUNTER — Emergency Department (HOSPITAL_COMMUNITY): Payer: Medicare Other

## 2014-11-27 DIAGNOSIS — R42 Dizziness and giddiness: Secondary | ICD-10-CM | POA: Diagnosis not present

## 2014-11-27 DIAGNOSIS — Z87440 Personal history of urinary (tract) infections: Secondary | ICD-10-CM | POA: Diagnosis not present

## 2014-11-27 DIAGNOSIS — Z79899 Other long term (current) drug therapy: Secondary | ICD-10-CM | POA: Insufficient documentation

## 2014-11-27 DIAGNOSIS — J449 Chronic obstructive pulmonary disease, unspecified: Secondary | ICD-10-CM | POA: Insufficient documentation

## 2014-11-27 DIAGNOSIS — Z7951 Long term (current) use of inhaled steroids: Secondary | ICD-10-CM | POA: Diagnosis not present

## 2014-11-27 DIAGNOSIS — Z720 Tobacco use: Secondary | ICD-10-CM | POA: Diagnosis not present

## 2014-11-27 DIAGNOSIS — E039 Hypothyroidism, unspecified: Secondary | ICD-10-CM | POA: Diagnosis not present

## 2014-11-27 MED ORDER — LORAZEPAM 1 MG PO TABS: 1.0000 mg | ORAL_TABLET | Freq: Three times a day (TID) | ORAL | Status: DC | PRN

## 2014-11-27 MED ORDER — LORAZEPAM 1 MG PO TABS
1.0000 mg | ORAL_TABLET | Freq: Once | ORAL | Status: AC
  Administered 2014-11-27: 1 mg via ORAL
  Filled 2014-11-27: qty 1

## 2014-11-27 NOTE — ED Notes (Signed)
Pt c/o dizziness upon position change.  This started this morning after her normal 3 cups of coffee.  She went to her knees when she stood up.  No LOC.  States that her head "feels fuzzy across her forehead".  CBG 151  BP 140/78  P80.

## 2014-11-27 NOTE — Discharge Instructions (Signed)

## 2014-11-27 NOTE — ED Provider Notes (Signed)
CSN: [id number]     Arrival date & time 11/27/14  1125 History   First MD Initiated Contact with Patient 11/27/14 1213     Chief Complaint  Patient presents with  . Dizziness     (Consider location/radiation/quality/duration/timing/severity/associated sxs/prior Treatment) HPI   Misty [name] is a 59 y.o. female who presents for evaluation of dizziness which was first noticed this morning, after drinking coffee, when she got up to walk. The dizziness improved with rest. The dizziness has recurred with movement of her head. She denies fever, chills, nausea, vomiting, change in vision or hearing status. There's been no chest pain, shortness of breath, change in bowel habits or urine. She is a smoker. No history of CVA, or cardiac disease. There are no other known modifying factors. There are no other known modifying factors.   Past Medical History  Diagnosis Date  . Hypothyroidism   . COPD (chronic obstructive pulmonary disease) 2006  . Urinary tract infection     hx/o  . Wears glasses   . Bipolar disorder     Dr. Evelene Croon  . Depression   . Anxiety   . Rheumatic fever     as a child, age 45yo   Past Surgical History  Procedure Laterality Date  . Appendectomy    . Abdominal hysterectomy    . Cesarean section    . Uterine fibroid surgery     Family History  Problem Relation Age of Onset  . Allergies Sister   . Asthma Sister   . Heart disease Mother   . Leukemia Mother    Social History  Substance Use Topics  . Smoking status: Current Every Day Smoker -- 0.25 packs/day for 40 years    Types: Cigarettes  . Smokeless tobacco: None     Comment: using Vaper cig 07/14/13//lmr  . Alcohol Use: Yes   OB History    No data available     Review of Systems  All other systems reviewed and are negative.     Allergies  Codeine  Home Medications   Prior to Admission medications   Medication Sig Start Date End Date Taking? Authorizing Provider  albuterol (PROVENTIL  HFA;VENTOLIN HFA) 108 (90 BASE) MCG/ACT inhaler Inhale 1 puff into the lungs every 6 (six) hours as needed for wheezing or shortness of breath (emergency).   Yes Historical Provider, MD  atorvastatin (LIPITOR) 20 MG tablet Take 20 mg by mouth every evening. 10/26/14  Yes Historical Provider, MD  budesonide-formoterol (SYMBICORT) 160-4.5 MCG/ACT inhaler Take 2 puffs first thing in am and then another 2 puffs about 12 hours later. 07/14/13  Yes Nyoka Cowden, MD  diazepam (VALIUM) 10 MG tablet Take 10 mg by mouth 3 (three) times daily as needed. Anxiety 11/13/14  Yes Historical Provider, MD  FLUoxetine (PROZAC) 20 MG capsule Take 20 mg by mouth every evening. 11/25/14  Yes Historical Provider, MD  gabapentin (NEURONTIN) 300 MG capsule Take 300 mg by mouth 3 (three) times daily.   Yes Historical Provider, MD  HYDROcodone-acetaminophen (NORCO/VICODIN) 5-325 MG per tablet Take 1 tablet by mouth every 6 (six) hours as needed. Patient taking differently: Take 0.5-1 tablets by mouth every 6 (six) hours as needed. pain 09/25/14  Yes Oswaldo Conroy, PA-C  lamoTRIgine (LAMICTAL) 150 MG tablet Take 300 mg by mouth daily. 11/25/14  Yes Historical Provider, MD  levothyroxine (SYNTHROID, LEVOTHROID) 75 MCG tablet Take 0.75 mcg by mouth daily.   Yes Historical Provider, MD  OLANZapine (ZYPREXA) 5 MG tablet  Take 5 mg by mouth every evening. 11/25/14  Yes Historical Provider, MD  valACYclovir (VALTREX) 500 MG tablet Take 500 mg by mouth daily. 08/17/14  Yes Historical Provider, MD   BP 121/69 mmHg  Pulse 71  Temp(Src) 98.1 F (36.7 C) (Oral)  Resp 14  Ht  (1.626 m)  Wt 138 lb (62.596 kg)  BMI 23.68 kg/m2  SpO2 95% Physical Exam  Constitutional: She is oriented to person, place, and time. She appears well-developed and well-nourished.  HENT:  Head: Normocephalic and atraumatic.  Right Ear: External ear normal.  Left Ear: External ear normal.  Eyes: Conjunctivae and EOM are normal. Pupils are equal, round, and  reactive to light.  Neck: Normal range of motion and phonation normal. Neck supple.  Cardiovascular: Normal rate, regular rhythm and normal heart sounds.   Pulmonary/Chest: Effort normal and breath sounds normal. She exhibits no bony tenderness.  Abdominal: Soft. There is no tenderness.  Musculoskeletal: Normal range of motion.  Neurological: She is alert and oriented to person, place, and time. No cranial nerve deficit or sensory deficit. She exhibits normal muscle tone. Coordination normal.  No dysarthria, nystagmus or ataxia. No pronator drift.  Skin: Skin is warm, dry and intact.  Psychiatric: She has a normal mood and affect. Her behavior is normal. Judgment and thought content normal.  Nursing note and vitals reviewed.   ED Course  Procedures (including critical care time)  Medications  LORazepam (ATIVAN) tablet 1 mg (1 mg Oral Given 11/27/14 1247)    Patient Vitals for the past 24 hrs:  BP Temp Temp src Pulse Resp SpO2 Height Weight  11/27/14 1500 130/73 mmHg 98 F (36.7 C) Oral 72 18 100 % - -  11/27/14 1132 121/69 mmHg 98.1 F (36.7 C) Oral 71 14 95 %  (1.626 m) 138 lb (62.596 kg)    At discharge- Reevaluation with update and discussion. After initial assessment and treatment, an updated evaluation reveals her dizziness has improved. Findings discussed with family member and patient, all questions were answered. Regan Llorente L    Labs Review Labs Reviewed - No data to display  Imaging Review No results found. I have personally reviewed and evaluated these images and lab results as part of my medical decision-making.   EKG Interpretation None      MDM   Final diagnoses:  Dizziness    Evaluation is consistent with vertigo, likely related to peripheral disease. Doubt CVA. Serous bacterial infection or metabolic instability.  Nursing Notes Reviewed/ Care Coordinated Applicable Imaging Reviewed Interpretation of Laboratory Data incorporated into ED  treatment  The patient appears reasonably screened and/or stabilized for discharge and I doubt any other medical condition or other [facility] requiring further screening, evaluation, or treatment in the ED at this time prior to discharge.  Plan: Home Medications- Ativan; Home Treatments- rest; return here if the recommended treatment, does not improve the symptoms; Recommended follow up- PCP prn     Mancel Bale, MD 11/27/14 1555

## 2014-11-27 NOTE — ED Notes (Signed)
EMS stoke screen negative.

## 2015-05-07 ENCOUNTER — Emergency Department (HOSPITAL_COMMUNITY): Payer: Medicare Other

## 2015-05-07 ENCOUNTER — Emergency Department (HOSPITAL_COMMUNITY)
Admission: EM | Admit: 2015-05-07 | Discharge: 2015-05-07 | Disposition: A | Discharge status: Home / Self Care | Payer: Medicare Other | Attending: Emergency Medicine | Admitting: Emergency Medicine

## 2015-05-07 ENCOUNTER — Encounter (HOSPITAL_COMMUNITY): Payer: Self-pay

## 2015-05-07 DIAGNOSIS — Z87440 Personal history of urinary (tract) infections: Secondary | ICD-10-CM | POA: Insufficient documentation

## 2015-05-07 DIAGNOSIS — F17210 Nicotine dependence, cigarettes, uncomplicated: Secondary | ICD-10-CM | POA: Insufficient documentation

## 2015-05-07 DIAGNOSIS — G44311 Acute post-traumatic headache, intractable: Secondary | ICD-10-CM | POA: Diagnosis not present

## 2015-05-07 DIAGNOSIS — Z79899 Other long term (current) drug therapy: Secondary | ICD-10-CM | POA: Diagnosis not present

## 2015-05-07 DIAGNOSIS — Z7951 Long term (current) use of inhaled steroids: Secondary | ICD-10-CM | POA: Insufficient documentation

## 2015-05-07 DIAGNOSIS — R42 Dizziness and giddiness: Secondary | ICD-10-CM | POA: Diagnosis not present

## 2015-05-07 DIAGNOSIS — R079 Chest pain, unspecified: Secondary | ICD-10-CM | POA: Diagnosis present

## 2015-05-07 DIAGNOSIS — J441 Chronic obstructive pulmonary disease with (acute) exacerbation: Secondary | ICD-10-CM | POA: Diagnosis not present

## 2015-05-07 DIAGNOSIS — F419 Anxiety disorder, unspecified: Secondary | ICD-10-CM | POA: Diagnosis not present

## 2015-05-07 DIAGNOSIS — R0789 Other chest pain: Secondary | ICD-10-CM | POA: Diagnosis not present

## 2015-05-07 DIAGNOSIS — E039 Hypothyroidism, unspecified: Secondary | ICD-10-CM | POA: Diagnosis not present

## 2015-05-07 DIAGNOSIS — F319 Bipolar disorder, unspecified: Secondary | ICD-10-CM | POA: Insufficient documentation

## 2015-05-07 LAB — I-STAT TROPONIN, ED
TROPONIN I, POC: 0 ng/mL (ref 0.00–0.08)
Troponin i, poc: 0 ng/mL (ref 0.00–0.08)

## 2015-05-07 LAB — CBC
HCT: 39.4 % (ref 36.0–46.0)
HEMOGLOBIN: 12.9 g/dL (ref 12.0–15.0)
MCH: 32.1 pg (ref 26.0–34.0)
MCHC: 32.7 g/dL (ref 30.0–36.0)
MCV: 98 fL (ref 78.0–100.0)
Platelets: 262 10*3/uL (ref 150–400)
RBC: 4.02 MIL/uL (ref 3.87–5.11)
RDW: 12.9 % (ref 11.5–15.5)
WBC: 10 10*3/uL (ref 4.0–10.5)

## 2015-05-07 LAB — BASIC METABOLIC PANEL
ANION GAP: 14 (ref 5–15)
BUN: 6 mg/dL (ref 6–20)
CALCIUM: 10.1 mg/dL (ref 8.9–10.3)
CO2: 21 mmol/L — ABNORMAL LOW (ref 22–32)
Chloride: 104 mmol/L (ref 101–111)
Creatinine, Ser: 1.12 mg/dL — ABNORMAL HIGH (ref 0.44–1.00)
GFR calc Af Amer: >60 mL/min (ref >60)
GFR, EST NON AFRICAN AMERICAN: 53 mL/min — AB (ref >60)
GLUCOSE: 108 mg/dL — AB (ref 65–99)
POTASSIUM: 3.5 mmol/L (ref 3.5–5.1)
SODIUM: 139 mmol/L (ref 135–145)

## 2015-05-07 MED ORDER — PREDNISONE 20 MG PO TABS: 40.0000 mg | ORAL_TABLET | Freq: Every day | ORAL | Status: DC

## 2015-05-07 MED ORDER — MECLIZINE HCL 25 MG PO TABS: 25.0000 mg | ORAL_TABLET | Freq: Three times a day (TID) | ORAL | Status: DC | PRN

## 2015-05-07 MED ORDER — IPRATROPIUM-ALBUTEROL 0.5-2.5 (3) MG/3ML IN SOLN
3.0000 mL | Freq: Once | RESPIRATORY_TRACT | Status: AC
  Administered 2015-05-07: 3 mL via RESPIRATORY_TRACT
  Filled 2015-05-07: qty 3

## 2015-05-07 MED ORDER — MECLIZINE HCL 25 MG PO TABS
25.0000 mg | ORAL_TABLET | Freq: Once | ORAL | Status: AC
  Administered 2015-05-07: 25 mg via ORAL
  Filled 2015-05-07: qty 1

## 2015-05-07 MED ORDER — PREDNISONE 20 MG PO TABS
60.0000 mg | ORAL_TABLET | Freq: Once | ORAL | Status: AC
  Administered 2015-05-07: 60 mg via ORAL
  Filled 2015-05-07: qty 3

## 2015-05-07 NOTE — ED Notes (Signed)
Pt reports had a concussion 01-23-15, since then has had headache everyday, top of head radiating down back of head and into temples.  Has been taking Tylenol daily.  Onset 5 weeks chest pressure and shortness of breath in the am.  Pt has to immediately use rescue inhaler and symbicor (which was prescribed by PCP 2 weeks ago, pt did not tell PCP about the chest pressure she has been having).   Pt talking in complete sentences.  Chest pressure this morning was unbearable.

## 2015-05-07 NOTE — ED Provider Notes (Signed)
Medical screening examination/treatment/procedure(s) were conducted as a shared visit with non-physician practitioner(s) and myself.  I personally evaluated the patient during the encounter.   EKG Interpretation None      ED ECG REPORT   Date: 05/07/2015  Rate: 71  Rhythm: normal sinus rhythm  QRS Axis: normal  Intervals: PR prolonged  ST/T Wave abnormalities: normal  Conduction Disutrbances:none  Narrative Interpretation:   Old EKG Reviewed: unchanged  I have personally reviewed the EKG tracing and agree with the computerized printout as noted.  Patient seen by me. Patient status post a concussion in October has had a headache every day since then. Was initially followed by her primary care for this but has not been back lately. He other concern is the development of chest pain that started 5 weeks ago however today's chest pain started at about 10 in the morning. It's in the anterior part of the chest associated with shortness of breath. First troponin was negative. We'll do a repeat troponin if that's negative this is unlikely to represent unstable angina or acute cardiac event. For her headaches probably related to a postconcussive syndrome patient will be referred to neurology. Patient without any neuro focal deficits nontoxic no acute distress. No hypoxia.  Results for orders placed or performed during the hospital encounter of 05/07/15  Basic metabolic panel  Result Value Ref Range   Sodium 139 135 - 145 mmol/L   Potassium 3.5 3.5 - 5.1 mmol/L   Chloride 104 101 - 111 mmol/L   CO2 21 (L) 22 - 32 mmol/L   Glucose, Bld 108 (H) 65 - 99 mg/dL   BUN 6 6 - 20 mg/dL   Creatinine, Ser 8.11 (H) 0.44 - 1.00 mg/dL   Calcium 91.4 8.9 - 78.2 mg/dL   GFR calc non Af Amer 53 (L) >60 mL/min   GFR calc Af Amer >60 >60 mL/min   Anion gap 14 5 - 15  CBC  Result Value Ref Range   WBC 10.0 4.0 - 10.5 K/uL   RBC 4.02 3.87 - 5.11 MIL/uL   Hemoglobin 12.9 12.0 - 15.0 g/dL   HCT 95.6 21.3 -  08.6 %   MCV 98.0 78.0 - 100.0 fL   MCH 32.1 26.0 - 34.0 pg   MCHC 32.7 30.0 - 36.0 g/dL   RDW 57.8 46.9 - 62.9 %   Platelets 262 150 - 400 K/uL  I-stat troponin, ED (not at [facility], [facility])  Result Value Ref Range   Troponin i, poc 0.00 0.00 - 0.08 ng/mL   Comment 3          I-stat troponin, ED  Result Value Ref Range   Troponin i, poc 0.00 0.00 - 0.08 ng/mL   Comment 3           Dg Chest 2 View  05/07/2015  CLINICAL DATA:  Chest pain with shortness of breath EXAM: CHEST  2 VIEW COMPARISON:  March 23, 2012 FINDINGS: There is minimal left base atelectasis. Lung bases are otherwise clear. Heart size and pulmonary vascularity are normal. Atherosclerotic calcification is noted in the aortic arch. No adenopathy. No pneumothorax. There is evidence of a prior fracture of the right clavicle with remodeling. 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 IMPRESSION: Minimal left base atelectasis.  No edema or consolidation. Electronically Signed   By: Bretta Bang III M.D.   On: 05/07/2015 15:58    \   Vanetta Mulders, MD 05/07/15 2133

## 2015-05-07 NOTE — Discharge Instructions (Signed)
We recommend that you follow-up with a neurologist for further evaluation of your frequent headaches as well as your persistent dizziness. You may take meclizine as prescribed for dizziness as needed. Your workup today did not suggest an emergent cause of your chest pressure. Your chest pressure may be related to your COPD if this is worsening. We would recommend a follow-up with a cardiologist for further evaluation of your chest pressure, especially if it continues. You may benefit from a stress test for further evaluation of the symptoms. Follow-up with your primary care doctor for a recheck of symptoms within the week. Return to the emergency department as needed if symptoms worsen.  Vertigo Vertigo means you feel like you or your surroundings are moving when they are not. Vertigo can be dangerous if it occurs when you are at work, driving, or performing difficult activities.  CAUSES  Vertigo occurs when there is a conflict of signals sent to your brain from the visual and sensory systems in your body. There are many different causes of vertigo, including:  Infections, especially in the inner ear.  A bad reaction to a drug or misuse of alcohol and medicines.  Withdrawal from drugs or alcohol.  Rapidly changing positions, such as lying down or rolling over in bed.  A migraine headache.  Decreased blood flow to the brain.  Increased pressure in the brain from a head injury, infection, tumor, or bleeding. SYMPTOMS  You may feel as though the world is spinning around or you are falling to the ground. Because your balance is upset, vertigo can cause nausea and vomiting. You may have involuntary eye movements (nystagmus). DIAGNOSIS  Vertigo is usually diagnosed by physical exam. If the cause of your vertigo is unknown, your caregiver may perform imaging tests, such as an MRI scan (magnetic resonance imaging). TREATMENT  Most cases of vertigo resolve on their own, without treatment. Depending  on the cause, your caregiver may prescribe certain medicines. If your vertigo is related to body position issues, your caregiver may recommend movements or procedures to correct the problem. In rare cases, if your vertigo is caused by certain inner ear problems, you may need surgery. HOME CARE INSTRUCTIONS   Follow your caregiver's instructions.  Avoid driving.  Avoid operating heavy machinery.  Avoid performing any tasks that would be dangerous to you or others during a vertigo episode.  Tell your caregiver if you notice that certain medicines seem to be causing your vertigo. Some of the medicines used to treat vertigo episodes can actually make them worse in some people. SEEK IMMEDIATE MEDICAL CARE IF:   Your medicines do not relieve your vertigo or are making it worse.  You develop problems with talking, walking, weakness, or using your arms, hands, or legs.  You develop severe headaches.  Your nausea or vomiting continues or gets worse.  You develop visual changes.  A family member notices behavioral changes.  Your condition gets worse. MAKE SURE YOU:  Understand these instructions.  Will watch your condition.  Will get help right away if you are not doing well or get worse.   This information is not intended to replace advice given to you by your health care provider. Make sure you discuss any questions you have with your health care provider.   Document Released: 12/19/2004 Document Revised: 06/03/2011 Document Reviewed: 07/04/2014 Elsevier Interactive Patient Education 2016 [employer]  General Headache Without Cause A headache is pain or discomfort felt around the head or neck area. The specific  cause of a headache may not be found. There are many causes and types of headaches. A few common ones are:  Tension headaches.  Migraine headaches.  Cluster headaches.  Chronic daily headaches. HOME CARE INSTRUCTIONS  Watch your condition for any changes. Take  these steps to help with your condition: Managing Pain  Take over-the-counter and prescription medicines only as told by your health care provider.  Lie down in a dark, quiet room when you have a headache.  If directed, apply ice to the head and neck area:  Put ice in a plastic bag.  Place a towel between your skin and the bag.  Leave the ice on for 20 minutes, 2-3 times per day.  Use a heating pad or hot shower to apply heat to the head and neck area as told by your health care provider.  Keep lights dim if bright lights bother you or make your headaches worse. Eating and Drinking  Eat meals on a regular schedule.  Limit alcohol use.  Decrease the amount of caffeine you drink, or stop drinking caffeine. General Instructions  Keep all follow-up visits as told by your health care provider. This is important.  Keep a headache journal to help find out what may trigger your headaches. For example, write down:  What you eat and drink.  How much sleep you get.  Any change to your diet or medicines.  Try massage or other relaxation techniques.  Limit stress.  Sit up straight, and do not tense your muscles.  Do not use tobacco products, including cigarettes, chewing tobacco, or e-cigarettes. If you need help quitting, ask your health care provider.  Exercise regularly as told by your health care provider.  Sleep on a regular schedule. Get 7-9 hours of sleep, or the amount recommended by your health care provider. SEEK MEDICAL CARE IF:   Your symptoms are not helped by medicine.  You have a headache that is different from the usual headache.  You have nausea or you vomit.  You have a fever. SEEK IMMEDIATE MEDICAL CARE IF:   Your headache becomes severe.  You have repeated vomiting.  You have a stiff neck.  You have a loss of vision.  You have problems with speech.  You have pain in the eye or ear.  You have muscular weakness or loss of muscle  control.  You lose your balance or have trouble walking.  You feel faint or pass out.  You have confusion.   This information is not intended to replace advice given to you by your health care provider. Make sure you discuss any questions you have with your health care provider.   Document Released: 03/11/2005 Document Revised: 11/30/2014 Document Reviewed: 07/04/2014 Elsevier Interactive Patient Education 2016 [employer] Nonspecific Chest Pain  Chest pain can be caused by many different conditions. There is always a chance that your pain could be related to something serious, such as a heart attack or a blood clot in your lungs. Chest pain can also be caused by conditions that are not life-threatening. If you have chest pain, it is very important to follow up with your health care provider. CAUSES  Chest pain can be caused by:  Heartburn.  Pneumonia or bronchitis.  Anxiety or stress.  Inflammation around your heart (pericarditis) or lung (pleuritis or pleurisy).  A blood clot in your lung.  A collapsed lung (pneumothorax). It can develop suddenly on its own (spontaneous pneumothorax) or from trauma to the chest.  Shingles  infection (varicella-zoster virus).  Heart attack.  Damage to the bones, muscles, and cartilage that make up your chest wall. This can include:  Bruised bones due to injury.  Strained muscles or cartilage due to frequent or repeated coughing or overwork.  Fracture to one or more ribs.  Sore cartilage due to inflammation (costochondritis). RISK FACTORS  Risk factors for chest pain may include:  Activities that increase your risk for trauma or injury to your chest.  Respiratory infections or conditions that cause frequent coughing.  Medical conditions or overeating that can cause heartburn.  Heart disease or family history of heart disease.  Conditions or health behaviors that increase your risk of developing a blood clot.  Having had  chicken pox (varicella zoster). SIGNS AND SYMPTOMS Chest pain can feel like:  Burning or tingling on the surface of your chest or deep in your chest.  Crushing, pressure, aching, or squeezing pain.  Dull or sharp pain that is worse when you move, cough, or take a deep breath.  Pain that is also felt in your back, neck, shoulder, or arm, or pain that spreads to any of these areas. Your chest pain may come and go, or it may stay constant. DIAGNOSIS Lab tests or other studies may be needed to find the cause of your pain. Your health care provider may have you take a test called an ambulatory ECG (electrocardiogram). An ECG records your heartbeat patterns at the time the test is performed. You may also have other tests, such as:  Transthoracic echocardiogram (TTE). During echocardiography, sound waves are used to create a picture of all of the heart structures and to look at how blood flows through your heart.  Transesophageal echocardiogram (TEE).This is a more advanced imaging test that obtains images from inside your body. It allows your health care provider to see your heart in finer detail.  Cardiac monitoring. This allows your health care provider to monitor your heart rate and rhythm in real time.  Holter monitor. This is a portable device that records your heartbeat and can help to diagnose abnormal heartbeats. It allows your health care provider to track your heart activity for several days, if needed.  Stress tests. These can be done through exercise or by taking medicine that makes your heart beat more quickly.  Blood tests.  Imaging tests. TREATMENT  Your treatment depends on what is causing your chest pain. Treatment may include:  Medicines. These may include:  Acid blockers for heartburn.  Anti-inflammatory medicine.  Pain medicine for inflammatory conditions.  Antibiotic medicine, if an infection is present.  Medicines to dissolve blood clots.  Medicines to  treat coronary artery disease.  Supportive care for conditions that do not require medicines. This may include:  Resting.  Applying heat or cold packs to injured areas.  Limiting activities until pain decreases. HOME CARE INSTRUCTIONS  If you were prescribed an antibiotic medicine, finish it all even if you start to feel better.  Avoid any activities that bring on chest pain.  Do not use any tobacco products, including cigarettes, chewing tobacco, or electronic cigarettes. If you need help quitting, ask your health care provider.  Do not drink alcohol.  Take medicines only as directed by your health care provider.  Keep all follow-up visits as directed by your health care provider. This is important. This includes any further testing if your chest pain does not go away.  If heartburn is the cause for your chest pain, you may be told to  keep your head raised (elevated) while sleeping. This reduces the chance that acid will go from your stomach into your esophagus.  Make lifestyle changes as directed by your health care provider. These may include:  Getting regular exercise. Ask your health care provider to suggest some activities that are safe for you.  Eating a heart-healthy diet. A registered dietitian can help you to learn healthy eating options.  Maintaining a healthy weight.  Managing diabetes, if necessary.  Reducing stress. SEEK MEDICAL CARE IF:  Your chest pain does not go away after treatment.  You have a rash with blisters on your chest.  You have a fever. SEEK IMMEDIATE MEDICAL CARE IF:   Your chest pain is worse.  You have an increasing cough, or you cough up blood.  You have severe abdominal pain.  You have severe weakness.  You faint.  You have chills.  You have sudden, unexplained chest discomfort.  You have sudden, unexplained discomfort in your arms, back, neck, or jaw.  You have shortness of breath at any time.  You suddenly start to  sweat, or your skin gets clammy.  You feel nauseous or you vomit.  You suddenly feel light-headed or dizzy.  Your heart begins to beat quickly, or it feels like it is skipping beats. These symptoms may represent a serious problem that is an emergency. Do not wait to see if the symptoms will go away. Get medical help right away. Call your local emergency services (911 in the [place] Do not drive yourself to the hospital.   This information is not intended to replace advice given to you by your health care provider. Make sure you discuss any questions you have with your health care provider.   Document Released: 12/19/2004 Document Revised: 04/01/2014 Document Reviewed: 10/15/2013 Elsevier Interactive Patient Education [employer]

## 2015-05-07 NOTE — ED Provider Notes (Signed)
CSN: [id number]     Arrival date & time 05/07/15  1505 History   First MD Initiated Contact with Patient 05/07/15 2019     Chief Complaint  Patient presents with  . Headache  . Chest Pain  . Shortness of Breath     (Consider location/radiation/quality/duration/timing/severity/associated sxs/prior Treatment) HPI Comments: Patient is a 60 year old female with a history of COPD, hypothyroid, bipolar disorder, depression, and anxiety. She presents to the emergency department for evaluation of chest pressure. Patient states that she has had chest pressure and shortness of breath in the past. She states that this has mostly been when she exerts herself. She has seen her primary care doctor for complaints of shortness of breath and was recently started on Symbicort for COPD. She states that her chest pressure this morning began shortly after waking and after onset of dizziness. Patient was diagnosed with vertigo in September 2016. She reports having 6-7 episodes of dizziness since this time, none of which have been associated with her chest pressure and shortness of breath. She reports that her sister saw her hands shaking bilaterally with her "eyes jerking" (?nystagmus). Patient reports that her chest pressure has been constant since this morning without modifying factors. She denies taking any medications for her symptoms. She does not currently feel short of breath. Patient denies fever, syncope, nausea, and vomiting. She does complain of a headache, but states that she has had daily headaches since a fall secondary to vertigo which occurred in October 2016. She denies any change in this headache from baseline.  Patient is a 60 y.o. female presenting with headaches, chest pain, and shortness of breath. The history is provided by the patient. No language interpreter was used.  Headache Associated symptoms: dizziness   Associated symptoms: no fever, no nausea and no vomiting   Chest Pain Associated  symptoms: dizziness, headache and shortness of breath   Associated symptoms: no fever, no nausea and not vomiting   Shortness of Breath Associated symptoms: chest pain and headaches   Associated symptoms: no fever and no vomiting     Past Medical History  Diagnosis Date  . Hypothyroidism   . COPD (chronic obstructive pulmonary disease) (HCC) 2006  . Urinary tract infection     hx/o  . Wears glasses   . Bipolar disorder (HCC)     Dr. Evelene Croon  . Depression   . Anxiety   . Rheumatic fever     as a child, age 30yo   Past Surgical History  Procedure Laterality Date  . Appendectomy    . Abdominal hysterectomy    . Cesarean section    . Uterine fibroid surgery     Family History  Problem Relation Age of Onset  . Allergies Sister   . Asthma Sister   . Heart disease Mother   . Leukemia Mother    Social History  Substance Use Topics  . Smoking status: Current Every Day Smoker -- 0.25 packs/day for 40 years    Types: Cigarettes  . Smokeless tobacco: None     Comment: using Vaper cig 07/14/13//lmr  . Alcohol Use: Yes   OB History    No data available      Review of Systems  Constitutional: Negative for fever.  Respiratory: Positive for shortness of breath.   Cardiovascular: Positive for chest pain.  Gastrointestinal: Negative for nausea and vomiting.  Neurological: Positive for dizziness and headaches. Negative for syncope.  All other systems reviewed and are negative.   Allergies  Codeine  Home Medications   Prior to Admission medications   Medication Sig Start Date End Date Taking? Authorizing Provider  albuterol (PROVENTIL HFA;VENTOLIN HFA) 108 (90 BASE) MCG/ACT inhaler Inhale 1 puff into the lungs every 6 (six) hours as needed for wheezing or shortness of breath (emergency).    Historical Provider, MD  atorvastatin (LIPITOR) 20 MG tablet Take 20 mg by mouth every evening. 10/26/14   Historical Provider, MD  budesonide-formoterol (SYMBICORT) 160-4.5 MCG/ACT inhaler  Take 2 puffs first thing in am and then another 2 puffs about 12 hours later. 07/14/13   Nyoka Cowden, MD  diazepam (VALIUM) 10 MG tablet Take 10 mg by mouth 3 (three) times daily as needed. Anxiety 11/13/14   Historical Provider, MD  FLUoxetine (PROZAC) 20 MG capsule Take 20 mg by mouth every evening. 11/25/14   Historical Provider, MD  gabapentin (NEURONTIN) 300 MG capsule Take 300 mg by mouth 3 (three) times daily.    Historical Provider, MD  HYDROcodone-acetaminophen (NORCO/VICODIN) 5-325 MG per tablet Take 1 tablet by mouth every 6 (six) hours as needed. Patient taking differently: Take 0.5-1 tablets by mouth every 6 (six) hours as needed. pain 09/25/14   Oswaldo Conroy, PA-C  lamoTRIgine (LAMICTAL) 150 MG tablet Take 300 mg by mouth daily. 11/25/14   Historical Provider, MD  levothyroxine (SYNTHROID, LEVOTHROID) 75 MCG tablet Take 0.75 mcg by mouth daily.    Historical Provider, MD  LORazepam (ATIVAN) 1 MG tablet Take 1 tablet (1 mg total) by mouth 3 (three) times daily as needed (dizziness). 11/27/14   Mancel Bale, MD  OLANZapine (ZYPREXA) 5 MG tablet Take 5 mg by mouth every evening. 11/25/14   Historical Provider, MD  valACYclovir (VALTREX) 500 MG tablet Take 500 mg by mouth daily. 08/17/14   Historical Provider, MD   BP 144/82 mmHg  Pulse 86  Temp(Src) 98.9 F (37.2 C) (Oral)  Resp 20  Ht  (1.626 m)  SpO2 95%   Physical Exam  Constitutional: She is oriented to person, place, and time. She appears well-developed and well-nourished. No distress.  Nontoxic/nonseptic appearing  HENT:  Head: Normocephalic and atraumatic.  Right Ear: Tympanic membrane, external ear and ear canal normal.  Left Ear: Tympanic membrane, external ear and ear canal normal.  Mouth/Throat: Oropharynx is clear and moist. No oropharyngeal exudate.  Patient tolerating secretions without difficulty.  Eyes: Conjunctivae and EOM are normal. Pupils are equal, round, and reactive to light. No scleral icterus.  Slight  anisocoria; L>R  Neck: Normal range of motion.  No nuchal rigidity or meningismus  Cardiovascular: Normal rate, regular rhythm and intact distal pulses.   Pulmonary/Chest: Effort normal. No respiratory distress. She has wheezes. She has no rales.  Faint diffuse expiratory wheeze. Prolonged expiratory phase. No rales or rhonchi. Chest expansion symmetric.  Musculoskeletal: Normal range of motion.  Neurological: She is alert and oriented to person, place, and time. No cranial nerve deficit. She exhibits normal muscle tone. Coordination normal.  GCS 15. Speech is goal oriented. No focal neurologic deficits appreciated. Patient moves extremities without ataxia.  Skin: Skin is warm and dry. No rash noted. She is not diaphoretic. No erythema. No pallor.  Psychiatric: She has a normal mood and affect. Her behavior is normal.  Nursing note and vitals reviewed.   ED Course  Procedures (including critical care time) Labs Review Labs Reviewed  BASIC METABOLIC PANEL - Abnormal; Notable for the following:    CO2 21 (*)    Glucose, Bld 108 (*)  Creatinine, Ser 1.12 (*)    GFR calc non Af Amer 53 (*)    All other components within normal limits  CBC  I-STAT TROPOININ, ED  Rosezena Sensor, ED    Imaging Review Dg Chest 2 View  05/07/2015  CLINICAL DATA:  Chest pain with shortness of breath EXAM: CHEST  2 VIEW COMPARISON:  March 23, 2012 FINDINGS: There is minimal left base atelectasis. Lung bases are otherwise clear. Heart size and pulmonary vascularity are normal. Atherosclerotic calcification is noted in the aortic arch. No adenopathy. No pneumothorax. There is evidence of a prior fracture of the right clavicle with remodeling. 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 IMPRESSION: Minimal left base atelectasis.  No edema or consolidation. Electronically Signed   By: Bretta Bang III M.D.   On: 05/07/2015 15:58   I have personally  reviewed and evaluated these images and lab results as part of my medical decision-making.   EKG Interpretation None      MDM   Final diagnoses:  Chest pressure  Intractable acute post-traumatic headache    60 year old female presents to the emergency department for evaluation of chest pressure which began this morning. Symptoms began in association with vertiginous symptoms which patient has had in the past. She reports some mild shortness of breath and states that she has had similar shortness of breath with chest pressure over the past month. Pressure has been constant since onset this morning. EKG shows no STEMI. Troponin negative 2. Remainder of laboratory workup is noncontributory. Chest x-ray is also reassuring.  I suspect the patient's chest pressure and shortness of breath may be related to her COPD. For this reason she has been given a nebulizer treatment which did improve her symptoms slightly. She will be discharged with a 5 day course of prednisone. Patient has Symbicort and albuterol at home. I believe the patient would also benefit from a stress test to further r/o cardiac etiology; will refer to cardiology. Clinical picture today is atypical for unstable angina or acute coronary event. We will also give prescription for meclizine for persistent vertigo and referred to neurology.  The patient has been advised to follow-up with her primary care doctor for a recheck of her symptoms. Return precautions given at discharge. Patient agreeable to plan with no unaddressed concerns. Patient discharged in good condition.   Filed Vitals:   05/07/15 2045 05/07/15 2100 05/07/15 2115 05/07/15 2130  BP: 142/75 128/75 130/74 131/73  Pulse: 68 70 69 74  Temp:      TempSrc:      Resp: Height:      SpO2: 99% 96% 97% 99%     Antony Madura, PA-C 05/07/15 2222

## 2015-05-07 NOTE — ED Notes (Signed)
Patient transported to X-ray 

## 2019-06-04 ENCOUNTER — Ambulatory Visit: Payer: Medicare Other | Admitting: Podiatry

## 2019-06-18 ENCOUNTER — Ambulatory Visit: Payer: Medicare HMO | Admitting: Podiatry

## 2019-06-18 ENCOUNTER — Other Ambulatory Visit: Payer: Self-pay

## 2019-06-18 ENCOUNTER — Encounter: Payer: Self-pay | Admitting: Podiatry

## 2019-06-18 DIAGNOSIS — L600 Ingrowing nail: Secondary | ICD-10-CM | POA: Diagnosis not present

## 2019-06-18 MED ORDER — NEOMYCIN-POLYMYXIN-HC 3.5-10000-1 OT SOLN: 4.0000 [drp] | Freq: Four times a day (QID) | OTIC | 0 refills | Status: DC

## 2019-06-18 NOTE — Patient Instructions (Addendum)

## 2019-06-18 NOTE — Progress Notes (Signed)
Subjective:   Patient ID: Misty [name], female   DOB: 64 y.o.   MRN: [id number]   HPI Patient presents stating she has had chronic ingrown toenail deformity right hallux and thickened painful left hallux nail the entire nail that is been sore and making it hard for her to be ambulatory or wear shoe gear comfortably.  Patient smokes quarter pack a day and does like to be active   Review of Systems  All other systems reviewed and are negative.       Objective:  Physical Exam Vitals and nursing note reviewed.  Constitutional:      Appearance: She is well-developed.  Pulmonary:     Effort: Pulmonary effort is normal.  Musculoskeletal:        General: Normal range of motion.  Skin:    General: Skin is warm.  Neurological:     Mental Status: She is alert.     Neurovascular status intact muscle strength adequate range of motion within normal limits with patient found to have incurvated right hallux medial border that is painful when pressed and thickened damage left hallux nail that is sore making it hard to wear shoe gear comfortable.  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  Recommend removal of the border right and explained procedure risk and infiltrated the right hallux 60 minutes like Marcaine mixture and then for the left recommend remove the entire nail and infiltrated 60 mg like Marcaine mixture.  Discussed procedure risk and patient wants surgery and I remove the medial border right hallux the entire nail left white chemical agent phenol 3 applications right 5 applications left sterile dressings applied and instructed on leaving dressings on 24 hours but take them off earlier if needed and also reviewed drop usage and encouraged to  call with questions concerns that may occur

## 2019-08-06 ENCOUNTER — Ambulatory Visit: Payer: Medicare Other | Admitting: Podiatry

## 2019-10-08 ENCOUNTER — Encounter (HOSPITAL_COMMUNITY): Payer: Self-pay

## 2019-10-08 ENCOUNTER — Other Ambulatory Visit: Payer: Self-pay

## 2019-10-08 ENCOUNTER — Inpatient Hospital Stay (HOSPITAL_COMMUNITY)
Admission: EM | Admit: 2019-10-08 | Discharge: 2019-10-10 | DRG: 190 | Disposition: A | Discharge status: Home / Self Care | Payer: Medicare HMO | Attending: Internal Medicine | Admitting: Internal Medicine

## 2019-10-08 ENCOUNTER — Emergency Department (HOSPITAL_COMMUNITY): Payer: Medicare HMO

## 2019-10-08 DIAGNOSIS — Z87891 Personal history of nicotine dependence: Secondary | ICD-10-CM

## 2019-10-08 DIAGNOSIS — Z806 Family history of leukemia: Secondary | ICD-10-CM

## 2019-10-08 DIAGNOSIS — M62838 Other muscle spasm: Secondary | ICD-10-CM | POA: Diagnosis present

## 2019-10-08 DIAGNOSIS — J9601 Acute respiratory failure with hypoxia: Secondary | ICD-10-CM | POA: Diagnosis present

## 2019-10-08 DIAGNOSIS — F419 Anxiety disorder, unspecified: Secondary | ICD-10-CM | POA: Diagnosis present

## 2019-10-08 DIAGNOSIS — J441 Chronic obstructive pulmonary disease with (acute) exacerbation: Secondary | ICD-10-CM | POA: Diagnosis not present

## 2019-10-08 DIAGNOSIS — Z825 Family history of asthma and other chronic lower respiratory diseases: Secondary | ICD-10-CM

## 2019-10-08 DIAGNOSIS — F319 Bipolar disorder, unspecified: Secondary | ICD-10-CM | POA: Diagnosis present

## 2019-10-08 DIAGNOSIS — R0602 Shortness of breath: Secondary | ICD-10-CM | POA: Diagnosis not present

## 2019-10-08 DIAGNOSIS — Z20822 Contact with and (suspected) exposure to covid-19: Secondary | ICD-10-CM | POA: Diagnosis present

## 2019-10-08 DIAGNOSIS — J449 Chronic obstructive pulmonary disease, unspecified: Secondary | ICD-10-CM

## 2019-10-08 DIAGNOSIS — Z885 Allergy status to narcotic agent status: Secondary | ICD-10-CM

## 2019-10-08 DIAGNOSIS — E039 Hypothyroidism, unspecified: Secondary | ICD-10-CM | POA: Diagnosis present

## 2019-10-08 DIAGNOSIS — E785 Hyperlipidemia, unspecified: Secondary | ICD-10-CM | POA: Diagnosis present

## 2019-10-08 DIAGNOSIS — Z8249 Family history of ischemic heart disease and other diseases of the circulatory system: Secondary | ICD-10-CM

## 2019-10-08 LAB — CBC
HCT: 37.9 % (ref 36.0–46.0)
Hemoglobin: 12 g/dL (ref 12.0–15.0)
MCH: 30.7 pg (ref 26.0–34.0)
MCHC: 31.7 g/dL (ref 30.0–36.0)
MCV: 96.9 fL (ref 80.0–100.0)
Platelets: 435 10*3/uL — ABNORMAL HIGH (ref 150–400)
RBC: 3.91 MIL/uL (ref 3.87–5.11)
RDW: 12.6 % (ref 11.5–15.5)
WBC: 12.9 10*3/uL — ABNORMAL HIGH (ref 4.0–10.5)
nRBC: 0 % (ref 0.0–0.2)

## 2019-10-08 LAB — BASIC METABOLIC PANEL
Anion gap: 15 (ref 5–15)
BUN: 13 mg/dL (ref 8–23)
CO2: 27 mmol/L (ref 22–32)
Calcium: 10.3 mg/dL (ref 8.9–10.3)
Chloride: 100 mmol/L (ref 98–111)
Creatinine, Ser: 0.94 mg/dL (ref 0.44–1.00)
GFR calc Af Amer: >60 mL/min (ref >60)
GFR calc non Af Amer: >60 mL/min (ref >60)
Glucose, Bld: 100 mg/dL — ABNORMAL HIGH (ref 70–99)
Potassium: 4.3 mmol/L (ref 3.5–5.1)
Sodium: 142 mmol/L (ref 135–145)

## 2019-10-08 LAB — SARS CORONAVIRUS 2 BY RT PCR (HOSPITAL ORDER, PERFORMED IN ~~LOC~~ HOSPITAL LAB): SARS Coronavirus 2: NEGATIVE

## 2019-10-08 MED ORDER — MAGNESIUM SULFATE IN D5W 1-5 GM/100ML-% IV SOLN
1.0000 g | Freq: Once | INTRAVENOUS | Status: AC
  Administered 2019-10-08: 1 g via INTRAVENOUS
  Filled 2019-10-08: qty 100

## 2019-10-08 MED ORDER — IPRATROPIUM-ALBUTEROL 0.5-2.5 (3) MG/3ML IN SOLN
3.0000 mL | Freq: Once | RESPIRATORY_TRACT | Status: AC
  Administered 2019-10-08: 3 mL via RESPIRATORY_TRACT
  Filled 2019-10-08: qty 3

## 2019-10-08 MED ORDER — METHYLPREDNISOLONE SODIUM SUCC 125 MG IJ SOLR
125.0000 mg | Freq: Once | INTRAMUSCULAR | Status: AC
  Administered 2019-10-08: 125 mg via INTRAVENOUS
  Filled 2019-10-08: qty 2

## 2019-10-08 MED ORDER — MAGNESIUM SULFATE 50 % IJ SOLN: 1.0000 g | Freq: Once | INTRAMUSCULAR | Status: DC

## 2019-10-08 MED ORDER — ALBUTEROL SULFATE HFA 108 (90 BASE) MCG/ACT IN AERS
8.0000 | INHALATION_SPRAY | RESPIRATORY_TRACT | Status: AC
  Administered 2019-10-08 (×2): 8 via RESPIRATORY_TRACT
  Filled 2019-10-08: qty 6.7

## 2019-10-08 MED ORDER — IPRATROPIUM BROMIDE HFA 17 MCG/ACT IN AERS
4.0000 | INHALATION_SPRAY | Freq: Once | RESPIRATORY_TRACT | Status: AC
  Administered 2019-10-08: 4 via RESPIRATORY_TRACT
  Filled 2019-10-08: qty 12.9

## 2019-10-08 MED ORDER — SODIUM CHLORIDE 0.9 % IV SOLN
100.0000 mg | Freq: Once | INTRAVENOUS | Status: AC
  Administered 2019-10-08: 100 mg via INTRAVENOUS
  Filled 2019-10-08: qty 100

## 2019-10-08 MED ORDER — BACLOFEN 10 MG PO TABS
10.0000 mg | ORAL_TABLET | Freq: Once | ORAL | Status: AC
  Administered 2019-10-09: 10 mg via ORAL
  Filled 2019-10-08: qty 1

## 2019-10-08 NOTE — ED Triage Notes (Signed)
Patient states she has COPD and has been seen by her physician and was prescribed Steroids. Patient went back for a follow up and was told to come to the ED for x-rays/scan.

## 2019-10-08 NOTE — ED Notes (Signed)
Patient had a visitor  to bring her food in the lobby. Screeners told Visitor that no outside food could be brought in and the visitor continued to go toward the patient. ED phlebotomist also told the patient that she could not eat outside food in the lobby. Patient then went outside and ate with her visitor.

## 2019-10-08 NOTE — ED Provider Notes (Signed)
[facility]-EMERGENCY DEPT Provider Note   CSN: [id number] Arrival date & time: 10/08/19  1203     History Chief Complaint  Patient presents with  . COPD    Misty [name] is a 64 y.o. female.   COPD This is a recurrent problem. The current episode started more than 1 week ago. The problem occurs constantly. The problem has been gradually worsening. Associated symptoms include shortness of breath. Pertinent negatives include no chest pain and no headaches. Nothing aggravates the symptoms. Nothing relieves the symptoms. Treatments tried: abx, steroids, inhaler. The treatment provided mild relief.       Past Medical History:  Diagnosis Date  . Anxiety   . Bipolar disorder (HCC)    Dr. Evelene Croon  . COPD (chronic obstructive pulmonary disease) (HCC) 2006  . Depression   . Hypothyroidism   . Rheumatic fever    as a child, age 4yo  . Urinary tract infection    hx/o  . Wears glasses     Patient Active Problem List   Diagnosis Date Noted  . Hypothyroidism 10/09/2019  . HLD (hyperlipidemia) 10/09/2019  . Bipolar 1 disorder (HCC) 10/09/2019  . COPD exacerbation (HCC) 10/08/2019  . COPD GOLD II 07/17/2013  . Smoker 07/17/2013    Past Surgical History:  Procedure Laterality Date  . ABDOMINAL HYSTERECTOMY    . APPENDECTOMY    . CESAREAN SECTION    . UTERINE FIBROID SURGERY       OB History   No obstetric history on file.     Family History  Problem Relation Age of Onset  . Allergies Sister   . Asthma Sister   . Heart disease Mother   . Leukemia Mother     Social History   Tobacco Use  . Smoking status: Current Every Day Smoker    Packs/day: 0.25    Years: 40.00    Pack years: 10.00    Types: Cigarettes  . Smokeless tobacco: Never Used  . Tobacco comment: using Vaper cig 07/14/13//lmr  Vaping Use  . Vaping Use: Former  Substance Use Topics  . Alcohol use: Yes  . Drug use: No    Home Medications Prior to Admission  medications   Medication Sig Start Date End Date Taking? Authorizing Provider  acetaminophen (TYLENOL) 500 MG tablet Take 500 mg by mouth every 6 (six) hours as needed for moderate pain.   Yes [provider]  albuterol (PROVENTIL HFA;VENTOLIN HFA) 108 (90 BASE) MCG/ACT inhaler Inhale 1 puff into the lungs every 6 (six) hours as needed for wheezing or shortness of breath (emergency).   Yes [provider]  atorvastatin (LIPITOR) 20 MG tablet Take 20 mg by mouth daily.  10/26/14  Yes [provider]  baclofen (LIORESAL) 10 MG tablet TAKE 1 TABLET THREE TIMES DAILY 12/15/18  Yes [provider]  buPROPion (WELLBUTRIN XL) 150 MG 24 hr tablet Take 300 mg by mouth every morning.  09/21/19  Yes [provider]  gabapentin (NEURONTIN) 600 MG tablet Take 300-900 mg by mouth 2 (two) times daily. Take 0.5 tablet (300 mg) in the morning and Take 1.5 (900 mg) in the evening 09/28/19  Yes [provider]  guaiFENesin (MUCINEX) 600 MG 12 hr tablet Take 600 mg by mouth 2 (two) times daily as needed for cough.   Yes [provider]  lamoTRIgine (LAMICTAL) 200 MG tablet Take 200 mg by mouth at bedtime.  08/09/19  Yes [provider]  levothyroxine (SYNTHROID, LEVOTHROID)  75 MCG tablet Take 0.75 mcg by mouth daily.   Yes [provider]  LORazepam (ATIVAN) 1 MG tablet Take 1 mg by mouth 3 (three) times daily as needed for anxiety.  09/28/19  Yes [provider]  Monte FantasiaWIXELA INHUB 250-50 MCG/DOSE AEPB Inhale 1 puff into the lungs 2 (two) times daily. 10/06/19  Yes [provider]  zolpidem (AMBIEN) 10 MG tablet Take 10 mg by mouth at bedtime. 09/07/19  Yes [provider]  azithromycin (ZITHROMAX) 250 MG tablet Take by mouth. Patient not taking: Reported on 10/08/2019 09/30/19   [provider]  budesonide-formoterol (SYMBICORT) 160-4.5 MCG/ACT inhaler Take 2 puffs first thing in am and then another 2 puffs about 12 hours  later. Patient not taking: Reported on 10/08/2019 07/14/13   Nyoka CowdenWert, Michael B, MD  diazepam (VALIUM) 10 MG tablet Take 10 mg by mouth 3 (three) times daily as needed. Anxiety Patient not taking: Reported on 10/08/2019 11/13/14   [provider]  gabapentin (NEURONTIN) 300 MG capsule Take 300 mg by mouth 3 (three) times daily. Patient not taking: Reported on 10/08/2019    [provider]  lamoTRIgine (LAMICTAL) 150 MG tablet Take 300 mg by mouth daily. Patient not taking: Reported on 10/08/2019 11/25/14   [provider]  mupirocin ointment (BACTROBAN) 2 % Apply topically 3 (three) times daily. Patient not taking: Reported on 10/08/2019 09/17/19   [provider]  neomycin-polymyxin-hydrocortisone (CORTISPORIN) OTIC solution Place 4 drops into both ears 4 (four) times daily. Patient not taking: Reported on 10/08/2019 06/18/19   Lenn Sinkegal, Norman S, DPM  predniSONE (DELTASONE) 50 MG tablet Take 50 mg by mouth daily. Patient not taking: Reported on 10/08/2019 09/30/19   [provider]  terbinafine (LAMISIL) 250 MG tablet Take 250 mg by mouth daily. Patient not taking: Reported on 10/08/2019    [provider]  valACYclovir (VALTREX) 500 MG tablet Take 500 mg by mouth daily. Patient not taking: Reported on 10/08/2019 08/17/14   [provider]    Allergies    Codeine  Review of Systems   Review of Systems  Constitutional: Negative for chills and fever.  HENT: Negative for congestion and rhinorrhea.   Respiratory: Positive for cough, chest tightness and shortness of breath.   Cardiovascular: Negative for chest pain and palpitations.  Gastrointestinal: Negative for diarrhea, nausea and vomiting.  Genitourinary: Negative for difficulty urinating and dysuria.  Musculoskeletal: Negative for arthralgias and back pain.  Skin: Negative for rash and wound.  Neurological: Negative for light-headedness and headaches.    Physical Exam Updated Vital  Signs BP 134/61   Pulse (!) 106   Temp 97.7 F (36.5 C) (Oral)   Resp 18   Ht 5\' 4"  (1.626 m)   Wt 61.2 kg   SpO2 94%   BMI 23.17 kg/m   Physical Exam Vitals and nursing note reviewed. Exam conducted with a chaperone present.  Constitutional:      General: She is not in acute distress.    Appearance: Normal appearance.  HENT:     Head: Normocephalic and atraumatic.     Nose: No rhinorrhea.  Eyes:     General:        Right eye: No discharge.        Left eye: No discharge.     Conjunctiva/sclera: Conjunctivae normal.  Cardiovascular:     Rate and Rhythm: Normal rate. Rhythm irregular.  Pulmonary:     Effort: Pulmonary effort is normal. No respiratory distress.  Breath sounds: No stridor. Wheezing and rhonchi present. No rales.  Chest:     Chest wall: No tenderness.  Abdominal:     General: Abdomen is flat. There is no distension.     Palpations: Abdomen is soft.  Musculoskeletal:        General: No tenderness or signs of injury.  Skin:    General: Skin is warm and dry.  Neurological:     General: No focal deficit present.     Mental Status: She is alert. Mental status is at baseline.     Motor: No weakness.  Psychiatric:        Mood and Affect: Mood normal.        Behavior: Behavior normal.     ED Results / Procedures / Treatments   Labs (all labs ordered are listed, but only abnormal results are displayed) Labs Reviewed  BASIC METABOLIC PANEL - Abnormal; Notable for the following components:      Result Value   Glucose, Bld 100 (*)    All other components within normal limits  CBC - Abnormal; Notable for the following components:   WBC 12.9 (*)    Platelets 435 (*)    All other components within normal limits  BASIC METABOLIC PANEL - Abnormal; Notable for the following components:   CO2 20 (*)    Glucose, Bld 160 (*)    All other components within normal limits  CBC - Abnormal; Notable for the following components:   WBC 16.6 (*)    RBC 3.50 (*)     Hemoglobin 10.8 (*)    HCT 33.5 (*)    All other components within normal limits  SARS CORONAVIRUS 2 BY RT PCR [facility] ORDER, PERFORMED IN [facility] LAB)    EKG EKG Interpretation  Date/Time:  Friday October 08 2019 12:48:34 EDT Ventricular Rate:  97 PR Interval:  190 QRS Duration: 80 QT Interval:  398 QTC Calculation: 505 R Axis:   18 Text Interpretation: Normal sinus rhythm Cannot rule out Anterior infarct , age undetermined Abnormal ECG Confirmed by Cherlynn Perches ([id number]) on 10/08/2019 7:32:26 PM   Radiology DG Chest 2 View  Result Date: 10/08/2019 CLINICAL DATA:  65 year old female with shortness of breath. EXAM: CHEST - 2 VIEW COMPARISON:  Chest radiograph dated 05/07/2015. FINDINGS: Probable mild emphysema and bronchitic changes. No focal consolidation, pleural effusion, or pneumothorax. Minimal posterior bibasilar densities, seen on the lateral view, likely atelectatic changes. The cardiac silhouette is within limits. Atherosclerotic calcification of the aorta. No acute osseous pathology. IMPRESSION: No active cardiopulmonary disease. Electronically Signed   By: Elgie Collard M.D.   On: 10/08/2019 20:39    Procedures Procedures (including critical care time)  Medications Ordered in ED Medications  atorvastatin (LIPITOR) tablet 20 mg (has no administration in time range)  buPROPion (WELLBUTRIN XL) 24 hr tablet 300 mg (has no administration in time range)  LORazepam (ATIVAN) tablet 1 mg (1 mg Oral Given 10/09/19 0347)  zolpidem (AMBIEN) tablet 5 mg (5 mg Oral Given 10/09/19 0121)  levothyroxine (SYNTHROID) tablet 75 mcg (75 mcg Oral Given 10/09/19 0635)  baclofen (LIORESAL) tablet 10 mg (has no administration in time range)  gabapentin (NEURONTIN) capsule 900 mg (900 mg Oral Given 10/09/19 0121)  lamoTRIgine (LAMICTAL) tablet 200 mg (200 mg Oral Given 10/09/19 0121)  guaiFENesin (MUCINEX) 12 hr tablet 600 mg (has no administration in time range)  ondansetron (ZOFRAN)  tablet 4 mg (has no administration in time range)    Or  ondansetron (ZOFRAN) injection 4 mg (has no administration in time range)  enoxaparin (LOVENOX) injection 40 mg (has no administration in time range)  albuterol (PROVENTIL) (2.5 MG/3ML) 0.083% nebulizer solution 2.5 mg (has no administration in time range)  budesonide (PULMICORT) nebulizer solution 0.25 mg (0.25 mg Nebulization Given 10/09/19 0913)  methylPREDNISolone sodium succinate (SOLU-MEDROL) 40 mg/mL injection 40 mg (40 mg Intravenous Given 10/09/19 0635)  azithromycin (ZITHROMAX) 500 mg in sodium chloride 0.9 % 250 mL IVPB (500 mg Intravenous New Bag/Given 10/09/19 0121)  ipratropium-albuterol (DUONEB) 0.5-2.5 (3) MG/3ML nebulizer solution 3 mL (3 mLs Nebulization Given 10/09/19 0908)  acetaminophen (TYLENOL) tablet 650 mg (650 mg Oral Given 10/09/19 0156)  methylPREDNISolone sodium succinate (SOLU-MEDROL) 125 mg/2 mL injection 125 mg (125 mg Intravenous Given 10/08/19 2004)  albuterol (VENTOLIN HFA) 108 (90 Base) MCG/ACT inhaler 8 puff (8 puffs Inhalation Given 10/08/19 2010)  ipratropium (ATROVENT HFA) inhaler 4 puff (4 puffs Inhalation Given 10/08/19 1949)  doxycycline (VIBRAMYCIN) 100 mg in sodium chloride 0.9 % 250 mL IVPB (0 mg Intravenous Stopped 10/08/19 2228)  magnesium sulfate IVPB 1 g 100 mL (0 g Intravenous Stopped 10/08/19 2145)  ipratropium-albuterol (DUONEB) 0.5-2.5 (3) MG/3ML nebulizer solution 3 mL (3 mLs Nebulization Given 10/08/19 2155)  baclofen (LIORESAL) tablet 10 mg (10 mg Oral Given 10/09/19 0003)    ED Course  I have reviewed the triage vital signs and the nursing notes.  Pertinent labs & imaging results that were available during my care of the patient were reviewed by me and considered in my medical decision making (see chart for details).    MDM Rules/Calculators/A&P                          Worsening COPD, history of the same, feels the same as previous flares.  Has been on steroids antibiotics over the last  week and a half but they have not helped.  Outpatient follow-up today recommended she come here for breathing treatments and further evaluation.  No focal lung sounds, no fevers, otherwise unremarkable exam with wheezing decreased breath sounds throughout.  She will get breathing treatments, steroids, antibiotics, magnesium.  EKG reviewed by myself shows no acute ischemic change interval abnormality or arrhythmia.  Show the chest x-ray as well and a Covid test as well.  Laboratory evaluation done earlier today shows mild elevation and white count with no other abnormalities and mild hyperglycemia but otherwise normal chemistry.  The patient had multiple breathing treatments and has mild improvement in symptoms but still has difficulty ambulating without shortness of breath and fatigue.  She never requires oxygen.  She is offered discharge home for admission for further management.  She feels that she needs more help as this has been going on for weeks and she has been trying outpatient management and has not been improving.  We agree to admit the patient I consult the hospitalist for admission.  They agreed admit the patient for further evaluation management of the COPD exacerbation.  Her Covid test is negative.  Final Clinical Impression(s) / ED Diagnoses Final diagnoses:  COPD exacerbation [facility])    Rx / DC Orders ED Discharge Orders    None       Sabino Donovan, MD 10/09/19 1008

## 2019-10-09 ENCOUNTER — Encounter (HOSPITAL_COMMUNITY): Payer: Self-pay | Admitting: Internal Medicine

## 2019-10-09 DIAGNOSIS — Z825 Family history of asthma and other chronic lower respiratory diseases: Secondary | ICD-10-CM | POA: Diagnosis not present

## 2019-10-09 DIAGNOSIS — E039 Hypothyroidism, unspecified: Secondary | ICD-10-CM | POA: Diagnosis present

## 2019-10-09 DIAGNOSIS — J9601 Acute respiratory failure with hypoxia: Secondary | ICD-10-CM | POA: Diagnosis present

## 2019-10-09 DIAGNOSIS — E785 Hyperlipidemia, unspecified: Secondary | ICD-10-CM | POA: Diagnosis present

## 2019-10-09 DIAGNOSIS — M62838 Other muscle spasm: Secondary | ICD-10-CM | POA: Diagnosis present

## 2019-10-09 DIAGNOSIS — J441 Chronic obstructive pulmonary disease with (acute) exacerbation: Principal | ICD-10-CM

## 2019-10-09 DIAGNOSIS — R0602 Shortness of breath: Secondary | ICD-10-CM | POA: Diagnosis present

## 2019-10-09 DIAGNOSIS — Z806 Family history of leukemia: Secondary | ICD-10-CM | POA: Diagnosis not present

## 2019-10-09 DIAGNOSIS — F319 Bipolar disorder, unspecified: Secondary | ICD-10-CM | POA: Diagnosis present

## 2019-10-09 DIAGNOSIS — Z8249 Family history of ischemic heart disease and other diseases of the circulatory system: Secondary | ICD-10-CM | POA: Diagnosis not present

## 2019-10-09 DIAGNOSIS — Z20822 Contact with and (suspected) exposure to covid-19: Secondary | ICD-10-CM | POA: Diagnosis present

## 2019-10-09 DIAGNOSIS — Z885 Allergy status to narcotic agent status: Secondary | ICD-10-CM | POA: Diagnosis not present

## 2019-10-09 DIAGNOSIS — Z87891 Personal history of nicotine dependence: Secondary | ICD-10-CM | POA: Diagnosis not present

## 2019-10-09 DIAGNOSIS — F419 Anxiety disorder, unspecified: Secondary | ICD-10-CM | POA: Diagnosis present

## 2019-10-09 LAB — BASIC METABOLIC PANEL
Anion gap: 13 (ref 5–15)
BUN: 10 mg/dL (ref 8–23)
CO2: 20 mmol/L — ABNORMAL LOW (ref 22–32)
Calcium: 9.3 mg/dL (ref 8.9–10.3)
Chloride: 104 mmol/L (ref 98–111)
Creatinine, Ser: 0.9 mg/dL (ref 0.44–1.00)
GFR calc Af Amer: >60 mL/min (ref >60)
GFR calc non Af Amer: >60 mL/min (ref >60)
Glucose, Bld: 160 mg/dL — ABNORMAL HIGH (ref 70–99)
Potassium: 4.4 mmol/L (ref 3.5–5.1)
Sodium: 137 mmol/L (ref 135–145)

## 2019-10-09 LAB — CBC
HCT: 33.5 % — ABNORMAL LOW (ref 36.0–46.0)
Hemoglobin: 10.8 g/dL — ABNORMAL LOW (ref 12.0–15.0)
MCH: 30.9 pg (ref 26.0–34.0)
MCHC: 32.2 g/dL (ref 30.0–36.0)
MCV: 95.7 fL (ref 80.0–100.0)
Platelets: 388 10*3/uL (ref 150–400)
RBC: 3.5 MIL/uL — ABNORMAL LOW (ref 3.87–5.11)
RDW: 12.7 % (ref 11.5–15.5)
WBC: 16.6 10*3/uL — ABNORMAL HIGH (ref 4.0–10.5)
nRBC: 0 % (ref 0.0–0.2)

## 2019-10-09 MED ORDER — BUPROPION HCL ER (XL) 300 MG PO TB24
300.0000 mg | ORAL_TABLET | Freq: Every morning | ORAL | Status: DC
  Administered 2019-10-09 – 2019-10-10 (×2): 300 mg via ORAL
  Filled 2019-10-09: qty 1
  Filled 2019-10-09: qty 2

## 2019-10-09 MED ORDER — ATORVASTATIN CALCIUM 20 MG PO TABS
20.0000 mg | ORAL_TABLET | Freq: Every day | ORAL | Status: DC
  Administered 2019-10-09 – 2019-10-10 (×2): 20 mg via ORAL
  Filled 2019-10-09: qty 2
  Filled 2019-10-09: qty 1

## 2019-10-09 MED ORDER — SODIUM CHLORIDE 0.9 % IV SOLN
500.0000 mg | Freq: Every day | INTRAVENOUS | Status: DC
  Administered 2019-10-09 (×2): 500 mg via INTRAVENOUS
  Filled 2019-10-09 (×3): qty 500

## 2019-10-09 MED ORDER — GUAIFENESIN ER 600 MG PO TB12: 600.0000 mg | ORAL_TABLET | Freq: Two times a day (BID) | ORAL | Status: DC | PRN

## 2019-10-09 MED ORDER — METHYLPREDNISOLONE SODIUM SUCC 40 MG IJ SOLR
40.0000 mg | Freq: Two times a day (BID) | INTRAMUSCULAR | Status: DC
  Administered 2019-10-09 – 2019-10-10 (×3): 40 mg via INTRAVENOUS
  Filled 2019-10-09 (×3): qty 1

## 2019-10-09 MED ORDER — ALBUTEROL SULFATE (2.5 MG/3ML) 0.083% IN NEBU: 2.5000 mg | INHALATION_SOLUTION | RESPIRATORY_TRACT | Status: DC | PRN

## 2019-10-09 MED ORDER — IPRATROPIUM-ALBUTEROL 0.5-2.5 (3) MG/3ML IN SOLN: 3.0000 mL | RESPIRATORY_TRACT | 1 refills | Status: AC | PRN

## 2019-10-09 MED ORDER — LAMOTRIGINE 100 MG PO TABS
200.0000 mg | ORAL_TABLET | Freq: Every day | ORAL | Status: DC
  Administered 2019-10-09 (×2): 200 mg via ORAL
  Filled 2019-10-09 (×2): qty 2

## 2019-10-09 MED ORDER — ENOXAPARIN SODIUM 40 MG/0.4ML ~~LOC~~ SOLN
40.0000 mg | SUBCUTANEOUS | Status: DC
  Administered 2019-10-09: 40 mg via SUBCUTANEOUS
  Filled 2019-10-09 (×3): qty 0.4

## 2019-10-09 MED ORDER — LORAZEPAM 1 MG PO TABS
1.0000 mg | ORAL_TABLET | Freq: Three times a day (TID) | ORAL | Status: DC | PRN
  Administered 2019-10-09 (×3): 1 mg via ORAL
  Filled 2019-10-09 (×4): qty 1

## 2019-10-09 MED ORDER — ONDANSETRON HCL 4 MG/2ML IJ SOLN: 4.0000 mg | Freq: Four times a day (QID) | INTRAMUSCULAR | Status: DC | PRN

## 2019-10-09 MED ORDER — ACETAMINOPHEN 325 MG PO TABS
650.0000 mg | ORAL_TABLET | Freq: Four times a day (QID) | ORAL | Status: DC | PRN
  Administered 2019-10-09 (×3): 650 mg via ORAL
  Filled 2019-10-09 (×4): qty 2

## 2019-10-09 MED ORDER — ZOLPIDEM TARTRATE 5 MG PO TABS
5.0000 mg | ORAL_TABLET | Freq: Every day | ORAL | Status: DC
  Administered 2019-10-09 (×2): 5 mg via ORAL
  Filled 2019-10-09 (×2): qty 1

## 2019-10-09 MED ORDER — AZITHROMYCIN 250 MG PO TABS
ORAL_TABLET | ORAL | 0 refills | Status: AC
Start: 2019-10-09 — End: 2019-10-14

## 2019-10-09 MED ORDER — BACLOFEN 10 MG PO TABS
10.0000 mg | ORAL_TABLET | Freq: Three times a day (TID) | ORAL | Status: DC
  Administered 2019-10-09 – 2019-10-10 (×4): 10 mg via ORAL
  Filled 2019-10-09 (×4): qty 1

## 2019-10-09 MED ORDER — IPRATROPIUM BROMIDE 0.02 % IN SOLN
0.5000 mg | RESPIRATORY_TRACT | Status: DC
  Administered 2019-10-09: 0.5 mg via RESPIRATORY_TRACT
  Filled 2019-10-09: qty 2.5

## 2019-10-09 MED ORDER — IPRATROPIUM-ALBUTEROL 0.5-2.5 (3) MG/3ML IN SOLN
3.0000 mL | Freq: Three times a day (TID) | RESPIRATORY_TRACT | Status: DC
  Administered 2019-10-09 – 2019-10-10 (×4): 3 mL via RESPIRATORY_TRACT
  Filled 2019-10-09 (×5): qty 3

## 2019-10-09 MED ORDER — ONDANSETRON HCL 4 MG PO TABS: 4.0000 mg | ORAL_TABLET | Freq: Four times a day (QID) | ORAL | Status: DC | PRN

## 2019-10-09 MED ORDER — BUDESONIDE 0.25 MG/2ML IN SUSP
0.2500 mg | Freq: Two times a day (BID) | RESPIRATORY_TRACT | Status: DC
  Administered 2019-10-09 – 2019-10-10 (×4): 0.25 mg via RESPIRATORY_TRACT
  Filled 2019-10-09 (×4): qty 2

## 2019-10-09 MED ORDER — LEVOTHYROXINE SODIUM 75 MCG PO TABS
75.0000 ug | ORAL_TABLET | Freq: Every day | ORAL | Status: DC
  Administered 2019-10-09 – 2019-10-10 (×2): 75 ug via ORAL
  Filled 2019-10-09 (×2): qty 1

## 2019-10-09 MED ORDER — GABAPENTIN 300 MG PO CAPS
900.0000 mg | ORAL_CAPSULE | Freq: Every day | ORAL | Status: DC
  Administered 2019-10-09 (×2): 900 mg via ORAL
  Filled 2019-10-09 (×2): qty 3

## 2019-10-09 MED ORDER — ALBUTEROL SULFATE (2.5 MG/3ML) 0.083% IN NEBU
2.5000 mg | INHALATION_SOLUTION | RESPIRATORY_TRACT | Status: DC
  Administered 2019-10-09: 2.5 mg via RESPIRATORY_TRACT
  Filled 2019-10-09: qty 3

## 2019-10-09 MED ORDER — PREDNISONE 10 MG PO TABS: ORAL_TABLET | ORAL | 0 refills | Status: AC

## 2019-10-09 NOTE — Progress Notes (Signed)
Resumed care for this pt at 2300. I agree with previous RNs assessment. Pt given PRN pain and anxiety medication upon request.

## 2019-10-09 NOTE — H&P (Signed)
History and Physical    CALLEIGH LAFONTANT BJS:[id number] DOB: [birth year] DOA: 10/08/2019  PCP: Eartha Inch, MD  Patient coming from: Home.  Chief Complaint: Shortness of breath.  HPI: Misty [name] is a 64 y.o. female with history of COPD quit smoking 4 years ago hyperlipidemia, hypothyroidism, bipolar disorder presents to the ER with complaint of shortness of breath she has been ongoing for last 10 days.  Patient symptoms started after patient had some upper back pain had placed some hot packs on the back which patient started inhaling and started getting short of breath.  Had gone to her PCP was prescribed antibiotics and prednisone despite taking which patient was still wheezing presents to the ER.  Denies chest pain fever chills.  ED Course: In the ER patient was hypoxic and wheezing bilaterally with chest x-ray showing nothing acute.  WBC count is 12.9 Covid test was negative.  EKG shows normal sinus rhythm.  Patient admitted for acute respiratory failure with hypoxia secondary to COPD exacerbation.  Review of Systems: As per HPI, rest all negative.   Past Medical History:  Diagnosis Date  . Anxiety   . Bipolar disorder (HCC)    Dr. Evelene Croon  . COPD (chronic obstructive pulmonary disease) (HCC) 2006  . Depression   . Hypothyroidism   . Rheumatic fever    as a child, age 59yo  . Urinary tract infection    hx/o  . Wears glasses     Past Surgical History:  Procedure Laterality Date  . ABDOMINAL HYSTERECTOMY    . APPENDECTOMY    . CESAREAN SECTION    . UTERINE FIBROID SURGERY       reports that she has been smoking cigarettes. She has a 10.00 pack-year smoking history. She has never used smokeless tobacco. She reports current alcohol use. She reports that she does not use drugs.  Allergies  Allergen Reactions  . Codeine Nausea Only and Other (See Comments)    "skin crawling"  Can take hydrocodone     Family History  Problem Relation Age of Onset  .  Allergies Sister   . Asthma Sister   . Heart disease Mother   . Leukemia Mother     Prior to Admission medications   Medication Sig Start Date End Date Taking? Authorizing Provider  acetaminophen (TYLENOL) 500 MG tablet Take 500 mg by mouth every 6 (six) hours as needed for moderate pain.   Yes [provider]  albuterol (PROVENTIL HFA;VENTOLIN HFA) 108 (90 BASE) MCG/ACT inhaler Inhale 1 puff into the lungs every 6 (six) hours as needed for wheezing or shortness of breath (emergency).   Yes [provider]  atorvastatin (LIPITOR) 20 MG tablet Take 20 mg by mouth daily.  10/26/14  Yes [provider]  baclofen (LIORESAL) 10 MG tablet TAKE 1 TABLET THREE TIMES DAILY 12/15/18  Yes [provider]  buPROPion (WELLBUTRIN XL) 150 MG 24 hr tablet Take 300 mg by mouth every morning.  09/21/19  Yes [provider]  gabapentin (NEURONTIN) 600 MG tablet Take 300-900 mg by mouth 2 (two) times daily. Take 0.5 tablet (300 mg) in the morning and Take 1.5 (900 mg) in the evening 09/28/19  Yes [provider]  guaiFENesin (MUCINEX) 600 MG 12 hr tablet Take 600 mg by mouth 2 (two) times daily as needed for cough.   Yes [provider]  lamoTRIgine (LAMICTAL) 200 MG tablet Take 200 mg by mouth at bedtime.  08/09/19  Yes [provider]  levothyroxine (SYNTHROID, LEVOTHROID) 75 MCG tablet Take 0.75 mcg by mouth daily.   Yes [provider]  LORazepam (ATIVAN) 1 MG tablet Take 1 mg by mouth 3 (three) times daily as needed for anxiety.  09/28/19  Yes [provider]  Monte Fantasia INHUB 250-50 MCG/DOSE AEPB Inhale 1 puff into the lungs 2 (two) times daily. 10/06/19  Yes [provider]  zolpidem (AMBIEN) 10 MG tablet Take 10 mg by mouth at bedtime. 09/07/19  Yes [provider]  azithromycin (ZITHROMAX) 250 MG tablet Take by mouth. Patient not taking: Reported on 10/08/2019 09/30/19   [provider]    budesonide-formoterol (SYMBICORT) 160-4.5 MCG/ACT inhaler Take 2 puffs first thing in am and then another 2 puffs about 12 hours later. Patient not taking: Reported on 10/08/2019 07/14/13   Nyoka Cowden, MD  diazepam (VALIUM) 10 MG tablet Take 10 mg by mouth 3 (three) times daily as needed. Anxiety Patient not taking: Reported on 10/08/2019 11/13/14   [provider]  gabapentin (NEURONTIN) 300 MG capsule Take 300 mg by mouth 3 (three) times daily. Patient not taking: Reported on 10/08/2019    [provider]  lamoTRIgine (LAMICTAL) 150 MG tablet Take 300 mg by mouth daily. Patient not taking: Reported on 10/08/2019 11/25/14   [provider]  mupirocin ointment (BACTROBAN) 2 % Apply topically 3 (three) times daily. Patient not taking: Reported on 10/08/2019 09/17/19   [provider]  neomycin-polymyxin-hydrocortisone (CORTISPORIN) OTIC solution Place 4 drops into both ears 4 (four) times daily. Patient not taking: Reported on 10/08/2019 06/18/19   Lenn Sink, DPM  predniSONE (DELTASONE) 50 MG tablet Take 50 mg by mouth daily. Patient not taking: Reported on 10/08/2019 09/30/19   [provider]  terbinafine (LAMISIL) 250 MG tablet Take 250 mg by mouth daily. Patient not taking: Reported on 10/08/2019    [provider]  valACYclovir (VALTREX) 500 MG tablet Take 500 mg by mouth daily. Patient not taking: Reported on 10/08/2019 08/17/14   [provider]    Physical Exam: Constitutional: Moderately built and nourished. Vitals:   10/08/19 2155 10/08/19 2200 10/08/19 2230 10/08/19 2300  BP:  130/73 136/68 126/62  Pulse:  99 (!) 111 (!) 109  Resp:  13 (!) 30 (!) 31  Temp:      TempSrc:      SpO2: 98% 100% 95% 92%  Weight:      Height:       Eyes: Anicteric no pallor. ENMT: No discharge from the ears eyes nose or mouth. Neck: No mass felt.  No neck rigidity. Respiratory: Bilateral expiratory wheeze and no  crepitations. Cardiovascular: S1-S2 heard. Abdomen: Soft nontender bowel sounds present. Musculoskeletal: No edema. Skin: No rash. Neurologic: Alert awake oriented to time place and person.  Moves all extremities. Psychiatric: Appears normal per normal affect.   Labs on Admission: I have personally reviewed following labs and imaging studies  CBC: Recent Labs  Lab 10/08/19 1352  WBC 12.9*  HGB 12.0  HCT 37.9  MCV 96.9  PLT 435*   Basic Metabolic Panel: Recent Labs  Lab 10/08/19 1352  NA 142  K 4.3  CL 100  CO2 27  GLUCOSE 100*  BUN 13  CREATININE 0.94  CALCIUM 10.3   GFR: Estimated Creatinine Clearance: 52.9 mL/min (by C-G formula based on SCr of 0.94 mg/dL). Liver Function Tests: No results for input(s): AST, ALT, ALKPHOS, BILITOT, PROT, ALBUMIN in the last 168 hours. No results for  input(s): LIPASE, AMYLASE in the last 168 hours. No results for input(s): AMMONIA in the last 168 hours. Coagulation Profile: No results for input(s): INR, PROTIME in the last 168 hours. Cardiac Enzymes: No results for input(s): CKTOTAL, CKMB, CKMBINDEX, TROPONINI in the last 168 hours. BNP (last 3 results) No results for input(s): PROBNP in the last 8760 hours. HbA1C: No results for input(s): HGBA1C in the last 72 hours. CBG: No results for input(s): GLUCAP in the last 168 hours. Lipid Profile: No results for input(s): CHOL, HDL, LDLCALC, TRIG, CHOLHDL, LDLDIRECT in the last 72 hours. Thyroid Function Tests: No results for input(s): TSH, T4TOTAL, FREET4, T3FREE, THYROIDAB in the last 72 hours. Anemia Panel: No results for input(s): VITAMINB12, FOLATE, FERRITIN, TIBC, IRON, RETICCTPCT in the last 72 hours. Urine analysis:    Component Value Date/Time   COLORURINE YELLOW 05/15/2009 1721   APPEARANCEUR CLOUDY (A) 05/15/2009 1721   LABSPEC 1.009 05/15/2009 1721   PHURINE 6.0 05/15/2009 1721   GLUCOSEU NEGATIVE 05/15/2009 1721   HGBUR TRACE (A) 05/15/2009 1721   BILIRUBINUR  NEGATIVE 05/15/2009 1721   KETONESUR NEGATIVE 05/15/2009 1721   PROTEINUR NEGATIVE 05/15/2009 1721   UROBILINOGEN 0.2 05/15/2009 1721   NITRITE NEGATIVE 05/15/2009 1721   LEUKOCYTESUR NEGATIVE 05/15/2009 1721   Sepsis Labs: @LABRCNTIP (procalcitonin:4,lacticidven:4) ) Recent Results (from the past 240 hour(s))  SARS Coronavirus 2 by RT PCR (hospital order, performed in [facility] hospital lab) Nasopharyngeal Nasopharyngeal Swab     Status: None   Collection Time: 10/08/19  8:10 PM   Specimen: Nasopharyngeal Swab  Result Value Ref Range Status   SARS Coronavirus 2 NEGATIVE NEGATIVE Final    Comment: (NOTE) SARS-CoV-2 target nucleic acids are NOT DETECTED.  The SARS-CoV-2 RNA is generally detectable in upper and lower respiratory specimens during the acute phase of infection. The lowest concentration of SARS-CoV-2 viral copies this assay can detect is 250 copies / mL. A negative result does not preclude SARS-CoV-2 infection and should not be used as the sole basis for treatment or other patient management decisions.  A negative result may occur with improper specimen collection / handling, submission of specimen other than nasopharyngeal swab, presence of viral mutation(s) within the areas targeted by this assay, and inadequate number of viral copies (<250 copies / mL). A negative result must be combined with clinical observations, patient history, and epidemiological information.  Fact Sheet for Patients:   10/10/19  Fact Sheet for Healthcare Providers: BoilerBrush.com.cy  This test is not yet approved or  cleared by the https://pope.com/ FDA and has been authorized for detection and/or diagnosis of SARS-CoV-2 by FDA under an Emergency Use Authorization (EUA).  This EUA will remain in effect (meaning this test can be used) for the duration of the COVID-19 declaration under Section 564(b)(1) of the Act, 21 U.S.C. section  360bbb-3(b)(1), unless the authorization is terminated or revoked sooner.  Performed at [facility], [address] [place], [place]      Radiological Exams on Admission: DG Chest 2 View  Result Date: 10/08/2019 CLINICAL DATA:  64 year old female with shortness of breath. EXAM: CHEST - 2 VIEW COMPARISON:  Chest radiograph dated 05/07/2015. FINDINGS: Probable mild emphysema and bronchitic changes. No focal consolidation, pleural effusion, or pneumothorax. Minimal posterior bibasilar densities, seen on the lateral view, likely atelectatic changes. The cardiac silhouette is within limits. Atherosclerotic calcification of the aorta. No acute osseous pathology. IMPRESSION: No active cardiopulmonary disease. Electronically Signed   By: 07/05/2015.D.  On: 10/08/2019 20:39    EKG: Independently reviewed.  Normal sinus rhythm.  Assessment/Plan Principal Problem:   COPD exacerbation (HCC) Active Problems:   Hypothyroidism   HLD (hyperlipidemia)   Bipolar 1 disorder (HCC)    1. Acute respiratory failure with hypoxia secondary to COPD exacerbation for which I have placed patient on IV Solu-Medrol IV Zithromax Pulmicort and nebulizer. 2. Hyperlipidemia on statins. 3. Hypothyroidism on Synthroid. 4. Bipolar disorder on Lamictal gabapentin Wellbutrin. 5. Chronic muscle spasms of the upper back and baclofen.   DVT prophylaxis: Lovenox. Code Status: Full code. Family Communication: Discussed with patient. Disposition Plan: Home. Consults called: None. Admission status: Observation.   Eduard ClosArshad N Jamiya Nims MD [facility] Pager [phone].  If 7PM-7AM, please contact night-coverage [email or web address] Password [email or web address]  10/09/2019, 12:27 AM

## 2019-10-09 NOTE — ED Notes (Signed)
Pt Sp02 [id number] RA after ambulating for several minutes

## 2019-10-09 NOTE — Progress Notes (Signed)
PROGRESS NOTE    Misty [name]  ZOX:[id number] DOB: [birth year] DOA: 10/08/2019 PCP: Eartha Inch, MD   Brief Narrative:  Misty [name] is a 64 y.o. female with history of COPD quit smoking 4 years ago hyperlipidemia, hypothyroidism, bipolar disorder presents to the ER with complaint of shortness of breath she has been ongoing for last 10 days.  Patient symptoms started after patient had some upper back pain had placed some hot packs on the back which patient started inhaling and started getting short of breath.  Had gone to her PCP was prescribed antibiotics and prednisone despite taking which patient was still wheezing presents to the ER.  Denies chest pain fever chills. In the ER patient was hypoxic and wheezing bilaterally with chest x-ray showing nothing acute.  WBC count is 12.9 Covid test was negative.  EKG shows normal sinus rhythm.  Patient admitted for acute respiratory failure with hypoxia secondary to COPD exacerbation.   Assessment & Plan:   Principal Problem:   COPD exacerbation (HCC) Active Problems:   Hypothyroidism   HLD (hyperlipidemia)   Bipolar 1 disorder (HCC)   Acute respiratory failure with transient hypoxia secondary to COPD exacerbation, POA -Continue supportive care, oxygen as needed, azithromycin, Solu-Medrol, budesonide, nebulizer treatments. -Lengthy discussion at bedside with patient, she does not have home nebulizer and has not seen pulmonology in over 5 years, patient appears to be on a subpar regimen given her frequent exacerbations currently only on one combo LABA/ICS with short acting beta agonist rescue inhaler -Discussed possible eliciting factors including dust, grass or pet dander.  She indicates she recently had been cleaning as well as outside near neighbor who was cutting grass. -Consider sideline of pulmonology at discharge for outpatient follow-up, she has previously seen pulmonology some 5 years ago but unclear which group she  was following.  Hyperlipidemia Continuing home statin  Hypothyroidism  Continue on home Synthroid.  Bipolar disorder  Continue on home Lamictal gabapentin Wellbutrin.  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   DVT prophylaxis: Lovenox. Code Status: Full code. Family Communication: None present, friend at bedside  Status is: Inpatient  Dispo: The patient is from: Home              Anticipated d/c is to: Home               Anticipated d/c date is: 24 to 48 hours pending clinical course              Patient currently not medically stable for discharge due to ongoing severe symptoms, limited ambulatory status in the setting of severe respiratory distress.  Consultants:   None  Procedures:   None planned  Antimicrobials:  None indicated  Subjective: Respiratory status markedly improved since last night, still markedly dyspneic with minimal exertion, denies nausea, vomiting, diarrhea, constipation, headache, fevers, chills.  Objective: Vitals:   10/09/19 0512 10/09/19 0530 10/09/19 0630 10/09/19 0758  BP: 111/61 (!) 111/56 117/66 124/63  Pulse: 95 94 (!) 101 93  Resp: 16 19 (!) 21 20  Temp:      TempSrc:      SpO2: 94% 93% 95% 93%  Weight:      Height:       No intake or output data in the 24 hours ending 10/09/19 0840 Filed Weights   10/08/19 1246 10/08/19 1920  Weight: 61.2 kg 61.2 kg    Examination:  General:  Pleasantly resting in bed, No acute distress. HEENT:  Normocephalic atraumatic.  Sclerae nonicteric, noninjected.  Extraocular movements intact bilaterally. Neck:  Without mass or deformity.  Trachea is midline. Lungs:  Without rhonchi or rales.  Scant end expiratory wheeze bilaterally, no accessory muscle use. Heart:  Regular rate and rhythm.  Without murmurs, rubs, or gallops. Abdomen:  Soft, nontender, nondistended.  Without guarding or rebound. Extremities: Without cyanosis, clubbing, edema, or obvious  deformity. Vascular:  Dorsalis pedis and posterior tibial pulses palpable bilaterally. Skin:  Warm and dry, no erythema, no ulcerations.   Data Reviewed: I have personally reviewed following labs and imaging studies  CBC: Recent Labs  Lab 10/08/19 1352 10/09/19 0500  WBC 12.9* 16.6*  HGB 12.0 10.8*  HCT 37.9 33.5*  MCV 96.9 95.7  PLT 435* 388   Basic Metabolic Panel: Recent Labs  Lab 10/08/19 1352 10/09/19 0500  NA 142 137  K 4.3 4.4  CL 100 104  CO2 27 20*  GLUCOSE 100* 160*  BUN 13 10  CREATININE 0.94 0.90  CALCIUM 10.3 9.3   GFR: Estimated Creatinine Clearance: 55.2 mL/min (by C-G formula based on SCr of 0.9 mg/dL). Liver Function Tests: No results for input(s): AST, ALT, ALKPHOS, BILITOT, PROT, ALBUMIN in the last 168 hours. No results for input(s): LIPASE, AMYLASE in the last 168 hours. No results for input(s): AMMONIA in the last 168 hours. Coagulation Profile: No results for input(s): INR, PROTIME in the last 168 hours. Cardiac Enzymes: No results for input(s): CKTOTAL, CKMB, CKMBINDEX, TROPONINI in the last 168 hours. BNP (last 3 results) No results for input(s): PROBNP in the last 8760 hours. HbA1C: No results for input(s): HGBA1C in the last 72 hours. CBG: No results for input(s): GLUCAP in the last 168 hours. Lipid Profile: No results for input(s): CHOL, HDL, LDLCALC, TRIG, CHOLHDL, LDLDIRECT in the last 72 hours. Thyroid Function Tests: No results for input(s): TSH, T4TOTAL, FREET4, T3FREE, THYROIDAB in the last 72 hours. Anemia Panel: No results for input(s): VITAMINB12, FOLATE, FERRITIN, TIBC, IRON, RETICCTPCT in the last 72 hours. Sepsis Labs: No results for input(s): PROCALCITON, LATICACIDVEN in the last 168 hours.  Recent Results (from the past 240 hour(s))  SARS Coronavirus 2 by RT PCR (hospital order, performed in [facility] hospital lab) Nasopharyngeal Nasopharyngeal Swab     Status: None   Collection Time: 10/08/19  8:10 PM   Specimen:  Nasopharyngeal Swab  Result Value Ref Range Status   SARS Coronavirus 2 NEGATIVE NEGATIVE Final    Comment: (NOTE) SARS-CoV-2 target nucleic acids are NOT DETECTED.  The SARS-CoV-2 RNA is generally detectable in upper and lower respiratory specimens during the acute phase of infection. The lowest concentration of SARS-CoV-2 viral copies this assay can detect is 250 copies / mL. A negative result does not preclude SARS-CoV-2 infection and should not be used as the sole basis for treatment or other patient management decisions.  A negative result may occur with improper specimen collection / handling, submission of specimen other than nasopharyngeal swab, presence of viral mutation(s) within the areas targeted by this assay, and inadequate number of viral copies (<250 copies / mL). A negative result must be combined with clinical observations, patient history, and epidemiological information.  Fact Sheet for Patients:   BoilerBrush.com.cy  Fact Sheet for Healthcare Providers: https://pope.com/  This test is not yet approved or  cleared by the [place] FDA and has been authorized for detection and/or diagnosis of SARS-CoV-2 by FDA under an Emergency Use Authorization (EUA).  This EUA will remain in effect (meaning  this test can be used) for the duration of the COVID-19 declaration under Section 564(b)(1) of the Act, 21 U.S.C. section 360bbb-3(b)(1), unless the authorization is terminated or revoked sooner.  Performed at [facility], [address], [place], [place] [address]       Radiology Studies: DG Chest 2 View  Result Date: 10/08/2019 CLINICAL DATA:  64 year old female with shortness of breath. EXAM: CHEST - 2 VIEW COMPARISON:  Chest radiograph dated 05/07/2015. FINDINGS: Probable mild emphysema and bronchitic changes. No focal consolidation, pleural effusion, or pneumothorax. Minimal posterior  bibasilar densities, seen on the lateral view, likely atelectatic changes. The cardiac silhouette is within limits. Atherosclerotic calcification of the aorta. No acute osseous pathology. IMPRESSION: No active cardiopulmonary disease. Electronically Signed   By: Elgie Collard M.D.   On: 10/08/2019 20:39    Scheduled Meds: . atorvastatin  20 mg Oral Daily  . baclofen  10 mg Oral TID  . budesonide (PULMICORT) nebulizer solution  0.25 mg Nebulization BID  . buPROPion  300 mg Oral q morning - 10a  . enoxaparin (LOVENOX) injection  40 mg Subcutaneous Q24H  . gabapentin  900 mg Oral QHS  . ipratropium-albuterol  3 mL Nebulization TID  . lamoTRIgine  200 mg Oral QHS  . levothyroxine  75 mcg Oral Q0600  . methylPREDNISolone (SOLU-MEDROL) injection  40 mg Intravenous Q12H  . zolpidem  5 mg Oral QHS   Continuous Infusions: . azithromycin 500 mg (10/09/19 0121)     LOS: 0 days   Time spent:  Azucena Fallen, DO [facility]  If 7PM-7AM, please contact night-coverage [email or web address]  10/09/2019, 8:40 AM

## 2019-10-10 LAB — CBC
HCT: 36.2 % (ref 36.0–46.0)
Hemoglobin: 11.4 g/dL — ABNORMAL LOW (ref 12.0–15.0)
MCH: 30.5 pg (ref 26.0–34.0)
MCHC: 31.5 g/dL (ref 30.0–36.0)
MCV: 96.8 fL (ref 80.0–100.0)
Platelets: 393 10*3/uL (ref 150–400)
RBC: 3.74 MIL/uL — ABNORMAL LOW (ref 3.87–5.11)
RDW: 12.9 % (ref 11.5–15.5)
WBC: 20.3 10*3/uL — ABNORMAL HIGH (ref 4.0–10.5)
nRBC: 0 % (ref 0.0–0.2)

## 2019-10-10 LAB — BASIC METABOLIC PANEL
Anion gap: 10 (ref 5–15)
BUN: 13 mg/dL (ref 8–23)
CO2: 24 mmol/L (ref 22–32)
Calcium: 10.2 mg/dL (ref 8.9–10.3)
Chloride: 109 mmol/L (ref 98–111)
Creatinine, Ser: 0.89 mg/dL (ref 0.44–1.00)
GFR calc Af Amer: >60 mL/min (ref >60)
GFR calc non Af Amer: >60 mL/min (ref >60)
Glucose, Bld: 112 mg/dL — ABNORMAL HIGH (ref 70–99)
Potassium: 4.1 mmol/L (ref 3.5–5.1)
Sodium: 143 mmol/L (ref 135–145)

## 2019-10-10 NOTE — TOC Initial Note (Signed)
Transition of Care [facility]) - Initial/Assessment Note    Patient Details  Name: Misty [name] MRN: [id number] Date of Birth: 04-20-1955  [facility] CM/SW Contact:    Armanda Heritage, RN Phone Number: 10/10/2019, 10:38 AM  Clinical Narrative:    Referral given to [employer] rep [place] for nebulizer machine.                Expected Discharge Plan: Home/Self Care Barriers to Discharge: Continued Medical Work up   Patient Goals and CMS Choice Patient states their goals for this hospitalization and ongoing recovery are:: to go home      Expected Discharge Plan and Services Expected Discharge Plan: Home/Self Care   Discharge Planning Services: CM Consult   Living arrangements for the past 2 months: Single Family Home Expected Discharge Date: 10/10/19               DME Arranged: Nebulizer machine DME Agency: [employer] Date DME Agency Contacted: 10/10/19 Time DME Agency Contacted: 1037 Representative spoke with at DME Agency: [place]            Prior Living Arrangements/Services Living arrangements for the past 2 months: Single Family Home                     Activities of Daily Living Home Assistive Devices/Equipment: Dentures (specify type), Eyeglasses ADL Screening (condition at time of admission) Patient's cognitive ability adequate to safely complete daily activities?: Yes Is the patient deaf or have difficulty hearing?: No Does the patient have difficulty seeing, even when wearing glasses/contacts?: No Does the patient have difficulty concentrating, remembering, or making decisions?: No Patient able to express need for assistance with ADLs?: Yes Does the patient have difficulty dressing or bathing?: No Independently performs ADLs?: Yes (appropriate for developmental age) Does the patient have difficulty walking or climbing stairs?: No Weakness of Legs: None Weakness of Arms/Hands: None  Permission Sought/Granted                   Emotional Assessment              Admission diagnosis:  COPD exacerbation (HCC) [J44.1] COPD mixed type [facility]) [J44.9] Patient Active Problem List   Diagnosis Date Noted  . Hypothyroidism 10/09/2019  . HLD (hyperlipidemia) 10/09/2019  . Bipolar 1 disorder (HCC) 10/09/2019  . COPD exacerbation (HCC) 10/08/2019  . COPD GOLD II 07/17/2013  . Smoker 07/17/2013   PCP:  Eartha Inch, MD Pharmacy:   [facility] [phone] - [place], [place] - [address] AT [address] [address] [place] [address] Phone: [phone] Fax: [phone]     Social Determinants of Health (SDOH) Interventions    Readmission Risk Interventions No flowsheet data found.

## 2019-10-10 NOTE — Discharge Summary (Signed)
Physician Discharge Summary  Misty [name] UEA:[id number] DOB: Aug 26, 1955 DOA: 10/08/2019  PCP: Eartha Inch, MD  Admit date: 10/08/2019 Discharge date: 10/10/2019  Admitted From: Home Disposition: Home  Recommendations for Outpatient Follow-up:  1. Follow up with PCP in 1-2 weeks, pulmonologist as scheduled 2. Please obtain BMP/CBC in one week  Home Health: None Equipment/Devices: Nebulizer  Discharge Condition: Stable CODE STATUS: Full Diet recommendation: Low-fat low-salt diet  Brief/Interim Summary: Misty Hink Christensenis a 64 y.o.femalewithhistory of COPD quit smoking 4 years ago hyperlipidemia, hypothyroidism, bipolar disorder presents to the ER with complaint of shortness of breath she has been ongoing for last 10 days. Patient symptoms started after patient had some upper back pain had placed some hot packs on the back which patient started inhaling and started getting short of breath. Had gone to her PCP was prescribed antibiotics and prednisone despite taking which patient was still wheezing presents to the ER. Denies chest pain fever chills. In the ER patient was hypoxic and wheezing bilaterally with chest x-ray showing nothing acute. WBC count is 12.9 Covid test was negative. EKG shows normal sinus rhythm. Patient admitted for acute respiratory failure with hypoxia secondary to COPD exacerbation.  Patient admitted as above with poorly controlled COPD, recently admitted and discharged, patient completed outpatient prednisone course with minimal improvement in symptoms and subsequently worsened thereafter. Readmitted without notable hypoxia, but remarkable respiratory distress.  Overnight with ongoing methylprednisolone, nebulizer treatments and azithromycin patient symptoms drastically revolved.  Patient now ambulating without overt respiratory distress, otherwise stable and agreeable for discharge home.  Patient has had nebulizer with duo nebs ordered for home  delivery, she is to continue her home long-acting inhaler as well as PRN albuterol inhaler if away from home.  She is also been continued on a prolonged course of prednisone as well as the remainder of her Z-Pak.  She will need close follow-up with pulmonology in the next few weeks for repeat PFTs to further evaluate if her COPD has continue to advance, as it is certainly not well controlled at this time.  We also discussed need for close monitoring of symptoms at home and close monitoring for possible triggering events of respiratory distress.  Discharge Diagnoses:  Principal Problem:   COPD exacerbation (HCC) Active Problems:   Hypothyroidism   HLD (hyperlipidemia)   Bipolar 1 disorder [facility])    Discharge Instructions  Discharge Instructions    Call MD for:  difficulty breathing, headache or visual disturbances   Complete by: As directed    Call MD for:  extreme fatigue   Complete by: As directed    Call MD for:  hives   Complete by: As directed    Call MD for:  persistant dizziness or light-headedness   Complete by: As directed    Call MD for:  persistant nausea and vomiting   Complete by: As directed    Call MD for:  severe uncontrolled pain   Complete by: As directed    Call MD for:  temperature >100.4   Complete by: As directed    Diet - low sodium heart healthy   Complete by: As directed    For home use only DME Nebulizer machine   Complete by: As directed    Patient needs a nebulizer to treat with the following condition: COPD (chronic obstructive pulmonary disease) (HCC)   Length of Need: Lifetime   Increase activity slowly   Complete by: As directed      Allergies as of 10/10/2019  Reactions   Codeine Nausea Only, Other (See Comments)   "skin crawling" Can take hydrocodone       Medication List    STOP taking these medications   budesonide-formoterol 160-4.5 MCG/ACT inhaler Commonly known as: Symbicort   diazepam 10 MG tablet Commonly known as:  VALIUM   mupirocin ointment 2 % Commonly known as: BACTROBAN   terbinafine 250 MG tablet Commonly known as: LAMISIL     TAKE these medications   acetaminophen 500 MG tablet Commonly known as: TYLENOL Take 500 mg by mouth every 6 (six) hours as needed for moderate pain.   albuterol 108 (90 Base) MCG/ACT inhaler Commonly known as: VENTOLIN HFA Inhale 1 puff into the lungs every 6 (six) hours as needed for wheezing or shortness of breath (emergency).   atorvastatin 20 MG tablet Commonly known as: LIPITOR Take 20 mg by mouth daily.   azithromycin 250 MG tablet Commonly known as: Zithromax Z-Pak Take 2 tablets (500 mg) on  Day 1,  followed by 1 tablet (250 mg) once daily on Days 2 through 5. What changed:   how to take this  additional instructions   baclofen 10 MG tablet Commonly known as: LIORESAL TAKE 1 TABLET THREE TIMES DAILY   buPROPion 150 MG 24 hr tablet Commonly known as: WELLBUTRIN XL Take 300 mg by mouth every morning.   gabapentin 600 MG tablet Commonly known as: NEURONTIN Take 300-900 mg by mouth 2 (two) times daily. Take 0.5 tablet (300 mg) in the morning and Take 1.5 (900 mg) in the evening What changed: Another medication with the same name was removed. Continue taking this medication, and follow the directions you see here.   guaiFENesin 600 MG 12 hr tablet Commonly known as: MUCINEX Take 600 mg by mouth 2 (two) times daily as needed for cough.   ipratropium-albuterol 0.5-2.5 (3) MG/3ML Soln Commonly known as: DUONEB Take 3 mLs by nebulization every 4 (four) hours as needed.   lamoTRIgine 200 MG tablet Commonly known as: LAMICTAL Take 200 mg by mouth at bedtime. What changed: Another medication with the same name was removed. Continue taking this medication, and follow the directions you see here.   levothyroxine 75 MCG tablet Commonly known as: SYNTHROID Take 0.75 mcg by mouth daily.   LORazepam 1 MG tablet Commonly known as: ATIVAN Take 1  mg by mouth 3 (three) times daily as needed for anxiety.   neomycin-polymyxin-hydrocortisone OTIC solution Commonly known as: CORTISPORIN Place 4 drops into both ears 4 (four) times daily.   predniSONE 10 MG tablet Commonly known as: DELTASONE Take 4 tablets (40 mg total) by mouth daily for 5 days, THEN 3 tablets (30 mg total) daily for 5 days, THEN 2 tablets (20 mg total) daily for 5 days, THEN 1 tablet (10 mg total) daily for 5 days. Start taking on: October 09, 2019 What changed:   medication strength  See the new instructions.   valACYclovir 500 MG tablet Commonly known as: VALTREX Take 500 mg by mouth daily.   Wixela Inhub 250-50 MCG/DOSE Aepb Generic drug: Fluticasone-Salmeterol Inhale 1 puff into the lungs 2 (two) times daily.   zolpidem 10 MG tablet Commonly known as: AMBIEN Take 10 mg by mouth at bedtime.            Durable Medical Equipment  (From admission, onward)         Start     Ordered   10/09/19 0000  For home use only DME Nebulizer machine  Question Answer Comment  Patient needs a nebulizer to treat with the following condition COPD (chronic obstructive pulmonary disease) (HCC)   Length of Need Lifetime      10/09/19 1245          Allergies  Allergen Reactions  . Codeine Nausea Only and Other (See Comments)    "skin crawling"  Can take hydrocodone     Consultations: None  Procedures/Studies: DG Chest 2 View  Result Date: 10/08/2019 CLINICAL DATA:  64 year old female with shortness of breath. EXAM: CHEST - 2 VIEW COMPARISON:  Chest radiograph dated 05/07/2015. FINDINGS: Probable mild emphysema and bronchitic changes. No focal consolidation, pleural effusion, or pneumothorax. Minimal posterior bibasilar densities, seen on the lateral view, likely atelectatic changes. The cardiac silhouette is within limits. Atherosclerotic calcification of the aorta. No acute osseous pathology. IMPRESSION: No active cardiopulmonary disease.  Electronically Signed   By: Elgie Collard M.D.   On: 10/08/2019 20:39     Subjective: No acute issues or events overnight, denies any further wheezing, shortness of breath, respiratory distress, headache, fevers, chills.   Discharge Exam: Vitals:   10/10/19 0748 10/10/19 0754  BP:    Pulse:    Resp:    Temp:    SpO2: 96% 96%   Vitals:   10/09/19 2148 10/10/19 0552 10/10/19 0748 10/10/19 0754  BP: 104/71 138/73    Pulse: (!) 106 84    Resp: 18 18    Temp: 99.1 F (37.3 C) 98.2 F (36.8 C)    TempSrc: Oral Oral    SpO2: 94% 95% 96% 96%  Weight:      Height:        General: Pt is alert, awake, not in acute distress Cardiovascular: RRR, S1/S2 +, no rubs, no gallops Respiratory: CTA bilaterally, no wheezing, no rhonchi Abdominal: Soft, NT, ND, bowel sounds + Extremities: no edema, no cyanosis    The results of significant diagnostics from this hospitalization (including imaging, microbiology, ancillary and laboratory) are listed below for reference.     Microbiology: Recent Results (from the past 240 hour(s))  SARS Coronavirus 2 by RT PCR (hospital order, performed in [facility] hospital lab) Nasopharyngeal Nasopharyngeal Swab     Status: None   Collection Time: 10/08/19  8:10 PM   Specimen: Nasopharyngeal Swab  Result Value Ref Range Status   SARS Coronavirus 2 NEGATIVE NEGATIVE Final    Comment: (NOTE) SARS-CoV-2 target nucleic acids are NOT DETECTED.  The SARS-CoV-2 RNA is generally detectable in upper and lower respiratory specimens during the acute phase of infection. The lowest concentration of SARS-CoV-2 viral copies this assay can detect is 250 copies / mL. A negative result does not preclude SARS-CoV-2 infection and should not be used as the sole basis for treatment or other patient management decisions.  A negative result may occur with improper specimen collection / handling, submission of specimen other than nasopharyngeal swab, presence of viral  mutation(s) within the areas targeted by this assay, and inadequate number of viral copies (<250 copies / mL). A negative result must be combined with clinical observations, patient history, and epidemiological information.  Fact Sheet for Patients:   BoilerBrush.com.cy  Fact Sheet for Healthcare Providers: https://pope.com/  This test is not yet approved or  cleared by the [place] FDA and has been authorized for detection and/or diagnosis of SARS-CoV-2 by FDA under an Emergency Use Authorization (EUA).  This EUA will remain in effect (meaning this test can be used) for the duration of the COVID-19  declaration under Section 564(b)(1) of the Act, 21 U.S.C. section 360bbb-3(b)(1), unless the authorization is terminated or revoked sooner.  Performed at [facility], [address], [place], [place] [address]      Labs: BNP (last 3 results) No results for input(s): BNP in the last 8760 hours. Basic Metabolic Panel: Recent Labs  Lab 10/08/19 1352 10/09/19 0500 10/10/19 0645  NA 142 137 143  K 4.3 4.4 4.1  CL 100 104 109  CO2 27 20* 24  GLUCOSE 100* 160* 112*  BUN 13 10 13   CREATININE 0.94 0.90 0.89  CALCIUM 10.3 9.3 10.2   Liver Function Tests: No results for input(s): AST, ALT, ALKPHOS, BILITOT, PROT, ALBUMIN in the last 168 hours. No results for input(s): LIPASE, AMYLASE in the last 168 hours. No results for input(s): AMMONIA in the last 168 hours. CBC: Recent Labs  Lab 10/08/19 1352 10/09/19 0500 10/10/19 0645  WBC 12.9* 16.6* 20.3*  HGB 12.0 10.8* 11.4*  HCT 37.9 33.5* 36.2  MCV 96.9 95.7 96.8  PLT 435* 388 393   Cardiac Enzymes: No results for input(s): CKTOTAL, CKMB, CKMBINDEX, TROPONINI in the last 168 hours. BNP: Invalid input(s): POCBNP CBG: No results for input(s): GLUCAP in the last 168 hours. D-Dimer No results for input(s): DDIMER in the last 72 hours. Hgb A1c No results  for input(s): HGBA1C in the last 72 hours. Lipid Profile No results for input(s): CHOL, HDL, LDLCALC, TRIG, CHOLHDL, LDLDIRECT in the last 72 hours. Thyroid function studies No results for input(s): TSH, T4TOTAL, T3FREE, THYROIDAB in the last 72 hours.  Invalid input(s): FREET3 Anemia work up No results for input(s): VITAMINB12, FOLATE, FERRITIN, TIBC, IRON, RETICCTPCT in the last 72 hours. Urinalysis    Component Value Date/Time   COLORURINE YELLOW 05/15/2009 1721   APPEARANCEUR CLOUDY (A) 05/15/2009 1721   LABSPEC 1.009 05/15/2009 1721   PHURINE 6.0 05/15/2009 1721   GLUCOSEU NEGATIVE 05/15/2009 1721   HGBUR TRACE (A) 05/15/2009 1721   BILIRUBINUR NEGATIVE 05/15/2009 1721   KETONESUR NEGATIVE 05/15/2009 1721   PROTEINUR NEGATIVE 05/15/2009 1721   UROBILINOGEN 0.2 05/15/2009 1721   NITRITE NEGATIVE 05/15/2009 1721   LEUKOCYTESUR NEGATIVE 05/15/2009 1721   Sepsis Labs Invalid input(s): PROCALCITONIN,  WBC,  LACTICIDVEN Microbiology Recent Results (from the past 240 hour(s))  SARS Coronavirus 2 by RT PCR (hospital order, performed in [facility] hospital lab) Nasopharyngeal Nasopharyngeal Swab     Status: None   Collection Time: 10/08/19  8:10 PM   Specimen: Nasopharyngeal Swab  Result Value Ref Range Status   SARS Coronavirus 2 NEGATIVE NEGATIVE Final    Comment: (NOTE) SARS-CoV-2 target nucleic acids are NOT DETECTED.  The SARS-CoV-2 RNA is generally detectable in upper and lower respiratory specimens during the acute phase of infection. The lowest concentration of SARS-CoV-2 viral copies this assay can detect is 250 copies / mL. A negative result does not preclude SARS-CoV-2 infection and should not be used as the sole basis for treatment or other patient management decisions.  A negative result may occur with improper specimen collection / handling, submission of specimen other than nasopharyngeal swab, presence of viral mutation(s) within the areas targeted by this  assay, and inadequate number of viral copies (<250 copies / mL). A negative result must be combined with clinical observations, patient history, and epidemiological information.  Fact Sheet for Patients:   10/10/19  Fact Sheet for Healthcare Providers: BoilerBrush.com.cy  This test is not yet approved or  cleared by the https://pope.com/  FDA and has been authorized for detection and/or diagnosis of SARS-CoV-2 by FDA under an Emergency Use Authorization (EUA).  This EUA will remain in effect (meaning this test can be used) for the duration of the COVID-19 declaration under Section 564(b)(1) of the Act, 21 U.S.C. section 360bbb-3(b)(1), unless the authorization is terminated or revoked sooner.  Performed at [facility], [address] [place], [place] [id number]      Time coordinating discharge: Over 30 minutes  SIGNED:   Azucena FallenWilliam C Darleene Cumpian, DO [facility] 10/10/2019, 11:50 AM Pager   If 7PM-7AM, please contact night-coverage [email or web address]

## 2019-10-19 ENCOUNTER — Encounter: Payer: Self-pay | Admitting: Pulmonary Disease

## 2019-10-19 ENCOUNTER — Ambulatory Visit: Payer: Medicare HMO | Admitting: Pulmonary Disease

## 2019-10-19 ENCOUNTER — Other Ambulatory Visit: Payer: Self-pay

## 2019-10-19 DIAGNOSIS — J449 Chronic obstructive pulmonary disease, unspecified: Secondary | ICD-10-CM

## 2019-10-19 MED ORDER — TRELEGY ELLIPTA 100-62.5-25 MCG/INH IN AEPB
1.0000 | INHALATION_SPRAY | Freq: Every day | RESPIRATORY_TRACT | 11 refills | Status: DC
Start: 2019-10-19 — End: 2020-04-18

## 2019-10-19 NOTE — Patient Instructions (Signed)
Nice to meet you!  Stop wixela and use Trelegy 1 puff daily. If too expensive please let me know and we will come up with a different plan.  Use the duonebs as needed once you start the new inhaler.  You do not need another pneumonia shot until you turn 65. Hooray!  We will see you back in 6 months (Dr. Judeth Horn)

## 2019-10-20 ENCOUNTER — Encounter: Payer: Self-pay | Admitting: Pulmonary Disease

## 2019-10-20 NOTE — Progress Notes (Signed)
@Patient  ID: , female    DOB: 10-14-1955, 64 y.o.   MRN: [id number]  Chief Complaint  Patient presents with  . Consult    COPD exacerbation.  was here in 2015 (byrum or wert)    Referring provider: 2016, MD  HPI:  Ms. Cunanan is a 64 year old woman with COPD (fev1 58% 2015) whom we are seeing in consultation at the request of Dr. 64 for evaluation of COPD with recent exacerbation requiring hospitalization.  She was admitted 7/16 to 7/18 with sudden onset severe shortness of breath and DOE. Increase cough with sputum as well. Some mild wheezing per patient. Had been on prednisone and antibiotic the week before as outpatient but failed to significantly improve and subsequently worsened shortly after steroid burst. This prompted presentation to ED. ED notes reviewed. CXR on admission reviewed and interpreted as clear lungs, hyperinflated, L hemidiaphragm elevated.  She was found to be hypoxemic requiring 2L [place]. She was admitted and received IV then oral steroids and oral antibiotics. She improved and was discharged on longer steroid taper.   She reports slow improvement though not back to baseline. She is encouraged with improvement and expects to get back to normal. She is using Wixela BID. Was on symbicort in past. Switched due to insurance. She is not sure if 8/18 is helping. Notes that for months to years prior to this exacerbation she had been doing well. Denies having limitations in activities.  Quit smoking 10 years ago, 60-80 pack year history.   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, 60-80 pack year history   PFT: PFT Results Latest Ref Rng & Units 07/16/2013  FVC-Pre L 2.80  FVC-Predicted Pre % 80  FVC-Post L 2.78  FVC-Predicted Post % 79  Pre  FEV1/FVC % % 64  Post FEV1/FCV % % 64  FEV1-Pre L 1.81  FEV1-Predicted Pre % 66  FEV1-Post L 1.79  DLCO UNC% % 49  DLCO COR %Predicted % 53  TLC L 5.04  TLC % Predicted % 96  RV % Predicted % 102     Imaging: Personally reviewed and as per discussion in this note DG Chest 2 View  Result Date: 10/08/2019 CLINICAL DATA:  64 year old female with shortness of breath. EXAM: CHEST - 2 VIEW COMPARISON:  Chest radiograph dated 05/07/2015. FINDINGS: Probable mild emphysema and bronchitic changes. No focal consolidation, pleural effusion, or pneumothorax. Minimal posterior bibasilar densities, seen on the lateral view, likely atelectatic changes. The cardiac silhouette is within limits. Atherosclerotic calcification of the aorta. No acute osseous pathology. IMPRESSION: No active cardiopulmonary disease. Electronically Signed   By: 07/05/2015 M.D.   On: 10/08/2019 20:39    Lab Results: Personally reviewed, abs EOS 300 04/2019 CBC    Component Value Date/Time   WBC 20.3 (H) 10/10/2019 0645   RBC 3.74 (L) 10/10/2019 0645   HGB 11.4 (L) 10/10/2019 0645   HCT 36.2 10/10/2019 0645   PLT 393 10/10/2019 0645   MCV 96.8 10/10/2019 0645   MCH 30.5 10/10/2019 0645   MCHC 31.5 10/10/2019 0645   RDW 12.9 10/10/2019 0645   LYMPHSABS 3.0 05/15/2009 1721   MONOABS 0.7 05/15/2009 1721   EOSABS 0.3 05/15/2009 1721   BASOSABS 0.0 05/15/2009 1721    BMET    Component Value Date/Time   NA 143 10/10/2019 0645  K 4.1 10/10/2019 0645   CL 109 10/10/2019 0645   CO2 24 10/10/2019 0645   GLUCOSE 112 (H) 10/10/2019 0645   BUN 13 10/10/2019 0645   CREATININE 0.89 10/10/2019 0645   CALCIUM 10.2 10/10/2019 0645   GFRNONAA >60 10/10/2019 0645   GFRAA >60 10/10/2019 0645    BNP No results found for: BNP  ProBNP No results found for: PROBNP  Specialty Problems      Pulmonary Problems   COPD GOLD II     - Spirometry 2015  FEV1 58%, ratio 64% - Exacerbation requiring hospitalization  09/2019      COPD exacerbation (HCC)      Allergies  Allergen Reactions  . Codeine Nausea Only and Other (See Comments)    "skin crawling"  Can take hydrocodone     Immunization History  Administered Date(s) Administered  . Influenza, Seasonal, Injecte, Preservative Fre 03/31/2017, 12/31/2017  . Influenza,inj,Quad PF,6-35 Mos 12/06/2018  . Influenza-Unspecified 12/23/2017  . Moderna SARS-COVID-2 Vaccination 04/26/2019, 05/24/2019  . Pneumococcal Polysaccharide-23 08/17/2014  . Tdap 03/27/2007, 09/09/2019    Past Medical History:  Diagnosis Date  . Anxiety   . Bipolar disorder (HCC)    Dr. Evelene Croon  . COPD (chronic obstructive pulmonary disease) (HCC) 2006  . Depression   . Hypothyroidism   . Rheumatic fever    as a child, age 44yo  . Urinary tract infection    hx/o  . Wears glasses     Tobacco History: Social History   Tobacco Use  Smoking Status Former Smoker  . Packs/day: 0.25  . Years: 40.00  . Pack years: 10.00  . Types: Cigarettes  . Quit date: 10/19/2015  . Years since quitting: 4.0  Smokeless Tobacco Never Used   Counseling given: Not Answered   Continue to not smoke  Outpatient Encounter Medications as of 10/19/2019  Medication Sig  . acetaminophen (TYLENOL) 500 MG tablet Take 500 mg by mouth every 6 (six) hours as needed for moderate pain.  Marland Kitchen albuterol (PROVENTIL HFA;VENTOLIN HFA) 108 (90 BASE) MCG/ACT inhaler Inhale 1 puff into the lungs every 6 (six) hours as needed for wheezing or shortness of breath (emergency).  Marland Kitchen atorvastatin (LIPITOR) 20 MG tablet Take 20 mg by mouth daily.   . baclofen (LIORESAL) 10 MG tablet TAKE 1 TABLET THREE TIMES DAILY  . buPROPion (WELLBUTRIN XL) 150 MG 24 hr tablet Take 300 mg by mouth every morning.   . gabapentin (NEURONTIN) 600 MG tablet Take 300-900 mg by mouth 2 (two) times daily. Take 0.5 tablet (300 mg) in the morning and Take 1.5 (900 mg) in the evening  . guaiFENesin (MUCINEX) 600 MG 12 hr tablet Take 600 mg  by mouth 2 (two) times daily as needed for cough.  Marland Kitchen ipratropium-albuterol (DUONEB) 0.5-2.5 (3) MG/3ML SOLN Take 3 mLs by nebulization every 4 (four) hours as needed.  . lamoTRIgine (LAMICTAL) 200 MG tablet Take 200 mg by mouth at bedtime.   Marland Kitchen levothyroxine (SYNTHROID, LEVOTHROID) 75 MCG tablet Take 0.75 mcg by mouth daily.  Marland Kitchen LORazepam (ATIVAN) 1 MG tablet Take 1 mg by mouth 3 (three) times daily as needed for anxiety.   . predniSONE (DELTASONE) 10 MG tablet Take 4 tablets (40 mg total) by mouth daily for 5 days, THEN 3 tablets (30 mg total) daily for 5 days, THEN 2 tablets (20 mg total) daily for 5 days, THEN 1 tablet (10 mg total) daily for 5 days.  . valACYclovir (VALTREX) 500 MG tablet Take 500 mg by  mouth daily.   [place] INHUB 250-50 MCG/DOSE AEPB Inhale 1 puff into the lungs 2 (two) times daily.  Marland Kitchen zolpidem (AMBIEN) 10 MG tablet Take 10 mg by mouth at bedtime.  . Fluticasone-Umeclidin-Vilant (TRELEGY ELLIPTA) 100-62.5-25 MCG/INH AEPB Inhale 1 puff into the lungs daily.  . [DISCONTINUED] neomycin-polymyxin-hydrocortisone (CORTISPORIN) OTIC solution Place 4 drops into both ears 4 (four) times daily. (Patient not taking: Reported on 10/08/2019)   No facility-administered encounter medications on file as of 10/19/2019.     Review of Systems  Review of Systems  No chest pain, no cough when well, No orthopnea or PND, no LE edema. Comprehensive review of systems otherwise negative  Physical Exam  BP 116/66 (BP Location: Left Arm, Cuff Size: Normal)   Pulse (!) 107   Temp 98.1 F (36.7 C) (Oral)   Ht 5\' 4"  (1.626 m)   Wt 137 lb 3.2 oz (62.2 kg)   SpO2 97%   BMI 23.55 kg/m   Wt Readings from Last 5 Encounters:  10/19/19 137 lb 3.2 oz (62.2 kg)  10/08/19 135 lb (61.2 kg)  11/27/14 138 lb (62.6 kg)  08/17/13 150 lb (68 kg)  07/14/13 147 lb 9.6 oz (67 kg)    BMI Readings from Last 5 Encounters:  10/19/19 23.55 kg/m  10/08/19 23.17 kg/m  05/07/15 23.69 kg/m  11/27/14 23.69  kg/m  08/17/13 24.96 kg/m     Physical Exam General: well appearing, in NAD Eyes: EOMI, no icterus Mouth: MMM, lips C/D/I Respiratory: mildly diminished lung sounds, CTAB, no wheeze Cardiovascular: RRR, no murmurs Abdomen: Non tender, BS present Musculoskeletal: No joint deformity, no synovitis Neuro: Normal gait, CN intact Psych: Normal mood, full affect   Assessment & Plan:   Ms. Brugh is a 64 year old woman with COPD (fev1 58% 2015) whom we are seeing in consultation at the request of Dr. 64 for evaluation of COPD with recent exacerbation requiring hospitalization.  Recent exacerbation without clear trigger. Clinically improving. Required hospitalization. Given hospitalization, will escalate to triple therapy Wixela to Trelegy. Up to date on PPSV23 and covid vaccines. Prevnar to be given age 69. Encourage flu shot in Fall.   Return in about 6 months (around 04/20/2020).   04/22/2020, MD 10/20/2019

## 2020-02-15 ENCOUNTER — Telehealth: Payer: Self-pay | Admitting: Pulmonary Disease

## 2020-02-15 NOTE — Telephone Encounter (Signed)
ATC pt, there was no answer and I could not leave a message. Will try back. 

## 2020-02-16 MED ORDER — TRELEGY ELLIPTA 100-62.5-25 MCG/INH IN AEPB
1.0000 | INHALATION_SPRAY | Freq: Every day | RESPIRATORY_TRACT | 0 refills | Status: DC
Start: 2020-02-16 — End: 2020-12-04

## 2020-02-16 NOTE — Telephone Encounter (Signed)
Called and spoke with patient about her Trelegy inhaler. She stated that she is out and in the donut hole. Advised her that I could send a message to Dr. Judeth Horn for her asking about the nebulized medications or leave some samples up front for her to pick up. She said she would like to pick up some samples. Advised her that I could only give her 2 and then we would have to see about other options when she ran out if she was still in the donut hole. She expressed understanding. Samples upfront. Nothing further needed at this time.

## 2020-02-16 NOTE — Telephone Encounter (Signed)
Patient calling back about her inhaler.  Patient is out.  [phone].

## 2020-04-18 ENCOUNTER — Encounter: Payer: Self-pay | Admitting: Pulmonary Disease

## 2020-04-18 ENCOUNTER — Ambulatory Visit: Payer: Medicare HMO | Admitting: Pulmonary Disease

## 2020-04-18 ENCOUNTER — Other Ambulatory Visit: Payer: Self-pay

## 2020-04-18 VITALS — BP 118/82 | HR 78 | Temp 97.8°F | Ht 64.0 in | Wt 154.8 lb

## 2020-04-18 DIAGNOSIS — R0609 Other forms of dyspnea: Secondary | ICD-10-CM

## 2020-04-18 DIAGNOSIS — J449 Chronic obstructive pulmonary disease, unspecified: Secondary | ICD-10-CM

## 2020-04-18 DIAGNOSIS — R0600 Dyspnea, unspecified: Secondary | ICD-10-CM

## 2020-04-18 MED ORDER — FLUTICASONE-SALMETEROL 250-50 MCG/DOSE IN AEPB: 1.0000 | INHALATION_SPRAY | Freq: Two times a day (BID) | RESPIRATORY_TRACT | 11 refills | Status: DC

## 2020-04-18 NOTE — Progress Notes (Signed)
@Patient  ID: , female    DOB: 1955-09-30, 65 y.o.   MRN: [id number]  Chief Complaint  Patient presents with   Follow-up    No complaints      Referring provider: [id number], MD  HPI:  Misty [name] is a 65 year old woman with COPD (fev1 58% 2015) whom we are seeing in follow up for COPD.  She is doing well. Thinks Trelegy has been helpful minimizing DOE. Present with climbing 3 flights of stairs.  Otherwise DOE not an issue. Cough is better on Trelegy.  Unfortunately, this medication is too expensive.  She went to bed at home midyear.  She spoke with [facility] and Advair can be provided with a $0 co-pay.  This is more financially attractive to her.  Discussed given her stability okay to stepdown therapy.  Advised if symptoms worsen, DOE, cough-we will need to find a solution for triple therapy.  Either petition for assistance with Trelegy versus adding a second inhaler for LAMA therapy.  HPI at initial visit: She was admitted 7/16 to 7/18 with sudden onset severe shortness of breath and DOE. Increase cough with sputum as well. Some mild wheezing per patient. Had been on prednisone and antibiotic the week before as outpatient but failed to significantly improve and subsequently worsened shortly after steroid burst. This prompted presentation to ED. ED notes reviewed. CXR on admission reviewed and interpreted as clear lungs, hyperinflated, L hemidiaphragm elevated.  She was found to be hypoxemic requiring 2L . She was admitted and received IV then oral steroids and oral antibiotics. She improved and was discharged on longer steroid taper.   She reports slow improvement though not back to baseline. She is encouraged with improvement and expects to get back to normal. She is using Wixela BID. Was on symbicort in past. Switched due to insurance. She is not sure if 8/18 is helping. Notes that for months to years prior to this exacerbation she had been doing well.  Denies having limitations in activities.  Quit smoking 10 years ago, 60-80 pack year history.   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, 60-80 pack year history   PFT: PFT Results Latest Ref Rng & Units 07/16/2013  FVC-Pre L 2.80  FVC-Predicted Pre % 80  FVC-Post L 2.78  FVC-Predicted Post % 79  Pre FEV1/FVC % % 64  Post FEV1/FCV % % 64  FEV1-Pre L 1.81  FEV1-Predicted Pre % 66  FEV1-Post L 1.79  DLCO uncorrected ml/min/mmHg 12.63  DLCO UNC% % 49  DLVA Predicted % 53  TLC L 5.04  TLC % Predicted % 96  RV % Predicted % 102     Imaging: Personally reviewed and as per discussion in this note   Lab Results: Personally reviewed, abs EOS 300 04/2019 CBC    Component Value Date/Time   WBC 20.3 (H) 10/10/2019 0645   RBC 3.74 (L) 10/10/2019 0645   HGB 11.4 (L) 10/10/2019 0645   HCT 36.2 10/10/2019 0645   PLT 393 10/10/2019 0645   MCV 96.8 10/10/2019 0645   MCH 30.5 10/10/2019 0645   MCHC 31.5 10/10/2019 0645   RDW 12.9 10/10/2019 0645   LYMPHSABS 3.0 05/15/2009 1721   MONOABS 0.7 05/15/2009 1721   EOSABS 0.3 05/15/2009 1721   BASOSABS 0.0 05/15/2009 1721    BMET    Component Value  Date/Time   NA 143 10/10/2019 0645   K 4.1 10/10/2019 0645   CL 109 10/10/2019 0645   CO2 24 10/10/2019 0645   GLUCOSE 112 (H) 10/10/2019 0645   BUN 13 10/10/2019 0645   CREATININE 0.89 10/10/2019 0645   CALCIUM 10.2 10/10/2019 0645   GFRNONAA >60 10/10/2019 0645   GFRAA >60 10/10/2019 0645    BNP No results found for: BNP  ProBNP No results found for: PROBNP  Specialty Problems      Pulmonary Problems   COPD GOLD II     - Spirometry 2015  FEV1 58%, ratio 64% - Exacerbation requiring hospitalization 09/2019      COPD exacerbation (HCC)      Allergies  Allergen Reactions   Codeine  Nausea Only and Other (See Comments)    "skin crawling"  Can take hydrocodone     Immunization History  Administered Date(s) Administered   Influenza Split 01/06/2020   Influenza Whole 01/05/2020   Influenza, Seasonal, Injecte, Preservative Fre 03/31/2017, 12/31/2017   Influenza,inj,Quad PF,6-35 Mos 12/06/2018   Influenza-Unspecified 12/23/2017   Moderna SARS-COV2 Booster Vaccination 11/24/2019   Moderna Sars-Covid-2 Vaccination 04/26/2019, 05/24/2019   Pneumococcal Polysaccharide-23 08/17/2014   Tdap 03/27/2007, 09/09/2019   Zoster Recombinat (Shingrix) 01/06/2020    Past Medical History:  Diagnosis Date   Anxiety    Bipolar disorder (HCC)    Dr. Evelene Croon   COPD (chronic obstructive pulmonary disease) (HCC) 2006   Depression    Hypothyroidism    Rheumatic fever    as a child, age 71yo   Urinary tract infection    hx/o   Wears glasses     Tobacco History: Social History   Tobacco Use  Smoking Status Former Smoker   Packs/day: 0.25   Years: 40.00   Pack years: 10.00   Types: Cigarettes   Quit date: 10/19/2015   Years since quitting: 4.5  Smokeless Tobacco Never Used   Counseling given: Not Answered   Continue to not smoke  Outpatient Encounter Medications as of 04/18/2020  Medication Sig   acetaminophen (TYLENOL) 500 MG tablet Take 500 mg by mouth every 6 (six) hours as needed for moderate pain.   albuterol (PROVENTIL HFA;VENTOLIN HFA) 108 (90 BASE) MCG/ACT inhaler Inhale 1 puff into the lungs every 6 (six) hours as needed for wheezing or shortness of breath (emergency).   atorvastatin (LIPITOR) 20 MG tablet Take 20 mg by mouth daily.    baclofen (LIORESAL) 10 MG tablet TAKE 1 TABLET THREE TIMES DAILY   buPROPion (WELLBUTRIN XL) 150 MG 24 hr tablet Take 300 mg by mouth every morning.    Fluticasone-Salmeterol (ADVAIR) 250-50 MCG/DOSE AEPB Inhale 1 puff into the lungs in the morning and at bedtime.   Fluticasone-Umeclidin-Vilant  (TRELEGY ELLIPTA) 100-62.5-25 MCG/INH AEPB Inhale 1 puff into the lungs daily.   gabapentin (NEURONTIN) 600 MG tablet Take 300-900 mg by mouth 2 (two) times daily. Take 0.5 tablet (300 mg) in the morning and Take 1.5 (900 mg) in the evening   guaiFENesin (MUCINEX) 600 MG 12 hr tablet Take 600 mg by mouth 2 (two) times daily as needed for cough.   ipratropium-albuterol (DUONEB) 0.5-2.5 (3) MG/3ML SOLN Take 3 mLs by nebulization every 4 (four) hours as needed.   lamoTRIgine (LAMICTAL) 200 MG tablet Take 200 mg by mouth at bedtime.    levothyroxine (SYNTHROID, LEVOTHROID) 75 MCG tablet Take 0.75 mcg by mouth daily.   LORazepam (ATIVAN) 1 MG tablet Take 1 mg by mouth 3 (three) times  daily as needed for anxiety.    valACYclovir (VALTREX) 500 MG tablet Take 500 mg by mouth daily.    [DISCONTINUED] Fluticasone-Umeclidin-Vilant (TRELEGY ELLIPTA) 100-62.5-25 MCG/INH AEPB Inhale 1 puff into the lungs daily.   [DISCONTINUED] WIXELA INHUB 250-50 MCG/DOSE AEPB Inhale 1 puff into the lungs 2 (two) times daily.   [DISCONTINUED] zolpidem (AMBIEN) 10 MG tablet Take 10 mg by mouth at bedtime.   No facility-administered encounter medications on file as of 04/18/2020.     Review of Systems  Review of Systems  No chest pain, no cough. Comprehensive review of systems otherwise negative  Physical Exam  BP 118/82 (BP Location: Left Arm, Cuff Size: Normal)    Pulse 78    Temp 97.8 F (36.6 C)    Ht 5\' 4"  (1.626 m)    Wt 154 lb 12.8 oz (70.2 kg)    SpO2 97%    BMI 26.57 kg/m   Wt Readings from Last 5 Encounters:  04/18/20 154 lb 12.8 oz (70.2 kg)  10/19/19 137 lb 3.2 oz (62.2 kg)  10/08/19 135 lb (61.2 kg)  11/27/14 138 lb (62.6 kg)  08/17/13 150 lb (68 kg)    BMI Readings from Last 5 Encounters:  04/18/20 26.57 kg/m  10/19/19 23.55 kg/m  10/08/19 23.17 kg/m  05/07/15 23.69 kg/m  11/27/14 23.69 kg/m     Physical Exam General: well appearing, in NAD Eyes: EOMI, no icterus Mouth:  MMM, lips C/D/I Respiratory: mildly diminished lung sounds, CTAB, no wheeze Cardiovascular: RRR, no murmurs Abdomen: Non tender, BS present Musculoskeletal: No joint deformity, no synovitis Neuro: Normal gait, CN intact Psych: Normal mood, full affect   Assessment & Plan:   Misty [name] is a 65 year old woman with COPD (fev1 58% 2015) whom we are seeing in Follow up for COPD.  Exacerbation without clear trigger summer 2021.  Required hospitalization. Given hospitalization, escalated to triple therapy - Wixela to Trelegy. Improved symptoms overall but too expensive. Given doing well will stepdown to Advair per insurance preference. Has PRN nebulized bronchodilators art home. If symptoms worsen will need to find solution to achieve triple therapy.  Up to date on PPSV23 and covid vaccines. Prevnar to be given age 4.   Return in about 6 months (around 10/16/2020).   10/18/2020, MD 04/18/2020

## 2020-04-18 NOTE — Patient Instructions (Signed)
I am sorry the Trelegy inhaler so expensive  I sent a prescription for Advair discus (dry powder inhaler similar to the Trelegy) per your conversation with [facility].  You can use the nebulizer as needed.  If you feel like your breathing severely worsens or phlegm production increases then we will consider adding another inhaler versus finding a solution to get you Trelegy at a more affordable price.  Return to clinic in 6 months with Dr. Judeth Horn.  Please call us if you need to be seen sooner.

## 2020-04-18 NOTE — Progress Notes (Signed)
@Patient  ID: , female    DOB: 08-03-1955, 65 y.o.   MRN: [id number]  Chief Complaint  Patient presents with  . Follow-up    No complaints      Referring provider: [id number], MD  HPI:  Misty [name] is a 64 year old woman with COPD (fev1 58% 2015) whom we are seeing in consultation at the request of Dr. 64 for evaluation of COPD with recent exacerbation requiring hospitalization.  She was admitted 7/16 to 7/18 with sudden onset severe shortness of breath and DOE. Increase cough with sputum as well. Some mild wheezing per patient. Had been on prednisone and antibiotic the week before as outpatient but failed to significantly improve and subsequently worsened shortly after steroid burst. This prompted presentation to ED. ED notes reviewed. CXR on admission reviewed and interpreted as clear lungs, hyperinflated, L hemidiaphragm elevated.  She was found to be hypoxemic requiring 2L [place]. She was admitted and received IV then oral steroids and oral antibiotics. She improved and was discharged on longer steroid taper.   She reports slow improvement though not back to baseline. She is encouraged with improvement and expects to get back to normal. She is using Wixela BID. Was on symbicort in past. Switched due to insurance. She is not sure if 8/18 is helping. Notes that for months to years prior to this exacerbation she had been doing well. Denies having limitations in activities.  Quit smoking 10 years ago, 60-80 pack year history.   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  Lab Results: Personally reviewed, abs EOS 300 04/2019 CBC    Component Value Date/Time   WBC 20.3 (H) 10/10/2019 0645   RBC 3.74 (L) 10/10/2019 0645   HGB 11.4 (L) 10/10/2019 0645   HCT 36.2 10/10/2019 0645   PLT 393 10/10/2019 0645   MCV 96.8 10/10/2019 0645   MCH 30.5 10/10/2019 0645   MCHC 31.5 10/10/2019 0645   RDW 12.9 10/10/2019 0645   LYMPHSABS 3.0 05/15/2009 1721   MONOABS 0.7 05/15/2009 1721   EOSABS 0.3 05/15/2009 1721   BASOSABS 0.0 05/15/2009 1721    BMET    Component Value Date/Time   NA 143 10/10/2019 0645   K 4.1 10/10/2019 0645   CL 109 10/10/2019 0645   CO2 24 10/10/2019 0645   GLUCOSE 112 (H) 10/10/2019 0645   BUN 13 10/10/2019 0645   CREATININE 0.89 10/10/2019 0645   CALCIUM 10.2 10/10/2019 0645   GFRNONAA >60 10/10/2019 0645   GFRAA >60 10/10/2019 0645    BNP No results found for: BNP  ProBNP No results found for: PROBNP  Specialty Problems      Pulmonary Problems   COPD GOLD II     -  Spirometry 2015  FEV1 58%, ratio 64% - Exacerbation requiring hospitalization 09/2019      COPD exacerbation (HCC)      Allergies  Allergen Reactions  . Codeine Nausea Only and Other (See Comments)    "skin crawling"  Can take hydrocodone     Immunization History  Administered Date(s) Administered  . Influenza Split 01/06/2020  . Influenza Whole 01/05/2020  . Influenza, Seasonal, Injecte, Preservative Fre 03/31/2017, 12/31/2017  . Influenza,inj,Quad PF,6-35 Mos 12/06/2018  . Influenza-Unspecified 12/23/2017  . Moderna SARS-COV2 Booster Vaccination 11/24/2019  . Moderna Sars-Covid-2 Vaccination 04/26/2019, 05/24/2019  . Pneumococcal Polysaccharide-23  08/17/2014  . Tdap 03/27/2007, 09/09/2019  . Zoster Recombinat (Shingrix) 01/06/2020    Past Medical History:  Diagnosis Date  . Anxiety   . Bipolar disorder (HCC)    Dr. Evelene Croon  . COPD (chronic obstructive pulmonary disease) (HCC) 2006  . Depression   . Hypothyroidism   . Rheumatic fever    as a child, age 86yo  . Urinary tract infection    hx/o  . Wears glasses     Tobacco History: Social History   Tobacco Use  Smoking Status Former Smoker  . Packs/day: 0.25  . Years: 40.00  . Pack years: 10.00  . Types: Cigarettes  . Quit date: 10/19/2015  . Years since quitting: 4.5  Smokeless Tobacco Never Used   Counseling given: Not Answered   Continue to not smoke  Outpatient Encounter Medications as of 04/18/2020  Medication Sig  . acetaminophen (TYLENOL) 500 MG tablet Take 500 mg by mouth every 6 (six) hours as needed for moderate pain.  Marland Kitchen albuterol (PROVENTIL HFA;VENTOLIN HFA) 108 (90 BASE) MCG/ACT inhaler Inhale 1 puff into the lungs every 6 (six) hours as needed for wheezing or shortness of breath (emergency).  Marland Kitchen atorvastatin (LIPITOR) 20 MG tablet Take 20 mg by mouth daily.   . baclofen (LIORESAL) 10 MG tablet TAKE 1 TABLET THREE TIMES DAILY  . buPROPion (WELLBUTRIN XL) 150 MG 24 hr tablet Take 300 mg by mouth every morning.   . Fluticasone-Salmeterol (ADVAIR) 250-50 MCG/DOSE AEPB Inhale 1 puff into the lungs in the morning and at bedtime.  . Fluticasone-Umeclidin-Vilant (TRELEGY ELLIPTA) 100-62.5-25 MCG/INH AEPB Inhale 1 puff into the lungs daily.  Marland Kitchen gabapentin (NEURONTIN) 600 MG tablet Take 300-900 mg by mouth 2 (two) times daily. Take 0.5 tablet (300 mg) in the morning and Take 1.5 (900 mg) in the evening  . guaiFENesin (MUCINEX) 600 MG 12 hr tablet Take 600 mg by mouth 2 (two) times daily as needed for cough.  Marland Kitchen ipratropium-albuterol (DUONEB) 0.5-2.5 (3) MG/3ML SOLN Take 3 mLs by nebulization every 4 (four) hours as needed.  . lamoTRIgine (LAMICTAL) 200 MG tablet Take  200 mg by mouth at bedtime.   Marland Kitchen levothyroxine (SYNTHROID, LEVOTHROID) 75 MCG tablet Take 0.75 mcg by mouth daily.  Marland Kitchen LORazepam (ATIVAN) 1 MG tablet Take 1 mg by mouth 3 (three) times daily as needed for anxiety.   . valACYclovir (VALTREX) 500 MG tablet Take 500 mg by mouth daily.   . [DISCONTINUED] Fluticasone-Umeclidin-Vilant (TRELEGY ELLIPTA) 100-62.5-25 MCG/INH AEPB Inhale 1 puff into the lungs daily.  . [DISCONTINUED] WIXELA INHUB 250-50 MCG/DOSE AEPB Inhale 1 puff into the lungs 2 (two) times daily.  . [DISCONTINUED] zolpidem (AMBIEN) 10 MG tablet Take 10 mg by mouth at bedtime.   No facility-administered encounter medications on file as of 04/18/2020.     Review of Systems  Review of Systems  No chest pain, no cough when  well, No orthopnea or PND, no LE edema. Comprehensive review of systems otherwise negative  Physical Exam  BP 118/82 (BP Location: Left Arm, Cuff Size: Normal)   Pulse 78   Temp 97.8 F (36.6 C)   Ht 5\' 4"  (1.626 m)   Wt 154 lb 12.8 oz (70.2 kg)   SpO2 97%   BMI 26.57 kg/m   Wt Readings from Last 5 Encounters:  04/18/20 154 lb 12.8 oz (70.2 kg)  10/19/19 137 lb 3.2 oz (62.2 kg)  10/08/19 135 lb (61.2 kg)  11/27/14 138 lb (62.6 kg)  08/17/13 150 lb (68 kg)    BMI Readings from Last 5 Encounters:  04/18/20 26.57 kg/m  10/19/19 23.55 kg/m  10/08/19 23.17 kg/m  05/07/15 23.69 kg/m  11/27/14 23.69 kg/m     Physical Exam General: well appearing, in NAD Eyes: EOMI, no icterus Mouth: MMM, lips C/D/I Respiratory: mildly diminished lung sounds, CTAB, no wheeze Cardiovascular: RRR, no murmurs Abdomen: Non tender, BS present Musculoskeletal: No joint deformity, no synovitis Neuro: Normal gait, CN intact Psych: Normal mood, full affect   Assessment & Plan:   Ms. [name] is a 65 year old woman with COPD (fev1 58% 2015) whom we are seeing in consultation at the request of Dr. 64 for evaluation of COPD with recent exacerbation  requiring hospitalization.  Recent exacerbation without clear trigger. Clinically improving. Required hospitalization. Given hospitalization, will escalate to triple therapy Wixela to Trelegy. Up to date on PPSV23 and covid vaccines. Prevnar to be given age 65. Encourage flu shot in Fall.   Return in about 6 months (around 10/16/2020).   10/18/2020, MD 04/18/2020

## 2020-12-04 ENCOUNTER — Ambulatory Visit: Payer: Medicare HMO | Admitting: Pulmonary Disease

## 2020-12-04 ENCOUNTER — Encounter: Payer: Self-pay | Admitting: Pulmonary Disease

## 2020-12-04 ENCOUNTER — Other Ambulatory Visit: Payer: Self-pay

## 2020-12-04 VITALS — BP 128/68 | HR 70 | Temp 98.3°F | Ht 62.0 in | Wt 156.8 lb

## 2020-12-04 DIAGNOSIS — J302 Other seasonal allergic rhinitis: Secondary | ICD-10-CM | POA: Diagnosis not present

## 2020-12-04 DIAGNOSIS — J441 Chronic obstructive pulmonary disease with (acute) exacerbation: Secondary | ICD-10-CM

## 2020-12-04 MED ORDER — MONTELUKAST SODIUM 10 MG PO TABS: 10.0000 mg | ORAL_TABLET | Freq: Every day | ORAL | 3 refills | Status: DC

## 2020-12-04 MED ORDER — CETIRIZINE HCL 10 MG PO TABS: 10.0000 mg | ORAL_TABLET | Freq: Every day | ORAL | 3 refills | Status: DC

## 2020-12-04 NOTE — Progress Notes (Signed)
@Patient  ID: , female    DOB: 06/14/1955, 65 y.o.   MRN: [id number]  Chief Complaint  Patient presents with   Follow-up    Pt states f/u after copd flare up in Aug took 3 weeks to get over.    Referring provider: Sep, MD  HPI:  Misty [name] is a 65 year old woman with COPD (fev1 58% 2015) whom we are seeing in follow up for COPD.  Recent exacerbation 10/2020. Thinks mediated by pollen as preceded by itchy watery eyes and nasal congestion, runny nose. Settled in chest. 2 courses of abx and prednisone, most recent was 10 days prednisone. Now doing better. Had been going up stairs fine. Finds this hard now. Cough improved.  Notes early fall or late summer typical of times of exacerbation of late.  Thinks related to pollen this time of year.  HPI at initial visit: She was admitted 7/16 to 7/18 with sudden onset severe shortness of breath and DOE. Increase cough with sputum as well. Some mild wheezing per patient. Had been on prednisone and antibiotic the week before as outpatient but failed to significantly improve and subsequently worsened shortly after steroid burst. This prompted presentation to ED. ED notes reviewed. CXR on admission reviewed and interpreted as clear lungs, hyperinflated, L hemidiaphragm elevated.  She was found to be hypoxemic requiring 2L . She was admitted and received IV then oral steroids and oral antibiotics. She improved and was discharged on longer steroid taper.   She reports slow improvement though not back to baseline. She is encouraged with improvement and expects to get back to normal. She is using Wixela BID. Was on symbicort in past. Switched due to insurance. She is not sure if 8/18 is helping. Notes that for months to years prior to this exacerbation she had been doing well. Denies having limitations in activities.  Quit smoking 10 years ago, 60-80 pack year history.   PMH: Anxiety, COPD Surgical History:  Hysterectomy, appendectomy Family History: sister with asthma, heart disease mother Social History: Former [employer], used to clean apartments, now retired, does not currently smoke, 60-80 pack year history   PFT: PFT Results Latest Ref Rng & Units 07/16/2013  FVC-Pre L 2.80  FVC-Predicted Pre % 80  FVC-Post L 2.78  FVC-Predicted Post % 79  Pre FEV1/FVC % % 64  Post FEV1/FCV % % 64  FEV1-Pre L 1.81  FEV1-Predicted Pre % 66  FEV1-Post L 1.79  DLCO uncorrected ml/min/mmHg 12.63  DLCO UNC% % 49  DLVA Predicted % 53  TLC L 5.04  TLC % Predicted % 96  RV % Predicted % 102  Personally viewed interpreted as moderate fixed obstruction   Imaging: Personally reviewed and as per discussion in this note   Lab Results: Personally reviewed, abs EOS 300 04/2019 CBC    Component Value Date/Time   WBC 20.3 (H) 10/10/2019 0645   RBC 3.74 (L) 10/10/2019 0645   HGB 11.4 (L) 10/10/2019 0645   HCT 36.2 10/10/2019 0645   PLT 393 10/10/2019 0645   MCV 96.8 10/10/2019 0645   MCH 30.5 10/10/2019 0645   MCHC 31.5 10/10/2019 0645   RDW 12.9 10/10/2019 0645   LYMPHSABS 3.0 05/15/2009 1721   MONOABS 0.7 05/15/2009 1721   EOSABS 0.3 05/15/2009 1721   BASOSABS 0.0 05/15/2009 1721    BMET    Component Value Date/Time   NA 143 10/10/2019 0645   K 4.1 10/10/2019 0645   CL 109 10/10/2019  0645   CO2 24 10/10/2019 0645   GLUCOSE 112 (H) 10/10/2019 0645   BUN 13 10/10/2019 0645   CREATININE 0.89 10/10/2019 0645   CALCIUM 10.2 10/10/2019 0645   GFRNONAA >60 10/10/2019 0645   GFRAA >60 10/10/2019 0645    BNP No results found for: BNP  ProBNP No results found for: PROBNP  Specialty Problems       Pulmonary Problems   COPD GOLD II     - Spirometry 2015  FEV1 58%, ratio 64% - Exacerbation requiring hospitalization 09/2019      COPD exacerbation (HCC)    Allergies  Allergen Reactions   Codeine Nausea Only and Other (See Comments)    "skin crawling"  Can take hydrocodone      Immunization History  Administered Date(s) Administered   Influenza Split 01/06/2020   Influenza Whole 01/05/2020   Influenza, Seasonal, Injecte, Preservative Fre 03/31/2017, 12/31/2017   Influenza,inj,Quad PF,6-35 Mos 12/06/2018   Influenza-Unspecified 12/23/2017   Moderna SARS-COV2 Booster Vaccination 11/24/2019   Moderna Sars-Covid-2 Vaccination 04/26/2019, 05/24/2019, 11/24/2019   Pneumococcal Polysaccharide-23 08/17/2014   Tdap 03/27/2007, 09/09/2019   Zoster Recombinat (Shingrix) 01/06/2020    Past Medical History:  Diagnosis Date   Anxiety    Bipolar disorder (HCC)    Dr. Evelene Croon   COPD (chronic obstructive pulmonary disease) (HCC) 2006   Depression    Hypothyroidism    Rheumatic fever    as a child, age 65yo   Urinary tract infection    hx/o   Wears glasses     Tobacco History: Social History   Tobacco Use  Smoking Status Former   Packs/day: 0.25   Years: 40.00   Pack years: 10.00   Types: Cigarettes   Quit date: 10/19/2015   Years since quitting: 5.1  Smokeless Tobacco Never   Counseling given: Not Answered   Continue to not smoke  Outpatient Encounter Medications as of 12/04/2020  Medication Sig   acetaminophen (TYLENOL) 500 MG tablet Take 500 mg by mouth every 6 (six) hours as needed for moderate pain.   albuterol (PROVENTIL HFA;VENTOLIN HFA) 108 (90 BASE) MCG/ACT inhaler Inhale 1 puff into the lungs every 6 (six) hours as needed for wheezing or shortness of breath (emergency).   atorvastatin (LIPITOR) 20 MG tablet Take 20 mg by mouth daily.    baclofen (LIORESAL) 10 MG tablet TAKE 1 TABLET THREE TIMES DAILY   buPROPion (WELLBUTRIN XL) 150 MG 24 hr tablet Take 300 mg by mouth every morning.    cetirizine (ZYRTEC ALLERGY) 10 MG tablet Take 1 tablet (10 mg total) by mouth daily.   gabapentin (NEURONTIN) 600 MG tablet Take 300-900 mg by mouth 2 (two) times daily. Take 0.5 tablet (300 mg) in the morning and Take 1.5 (900 mg) in the evening   guaiFENesin  (MUCINEX) 600 MG 12 hr tablet Take 600 mg by mouth 2 (two) times daily as needed for cough.   guaiFENesin (MUCINEX) 600 MG 12 hr tablet Take by mouth.   ipratropium-albuterol (DUONEB) 0.5-2.5 (3) MG/3ML SOLN Take 3 mLs by nebulization every 4 (four) hours as needed.   lamoTRIgine (LAMICTAL) 200 MG tablet Take 200 mg by mouth at bedtime.    levothyroxine (SYNTHROID, LEVOTHROID) 75 MCG tablet Take 0.75 mcg by mouth daily.   montelukast (SINGULAIR) 10 MG tablet Take 1 tablet (10 mg total) by mouth at bedtime.   Fluticasone-Salmeterol (ADVAIR) 250-50 MCG/DOSE AEPB Inhale 1 puff into the lungs in the morning and at bedtime.   Fluticasone-Umeclidin-Vilant (TRELEGY  ELLIPTA) 100-62.5-25 MCG/INH AEPB Inhale 1 puff into the lungs daily.   LORazepam (ATIVAN) 1 MG tablet Take 1 mg by mouth 3 (three) times daily as needed for anxiety.    valACYclovir (VALTREX) 500 MG tablet Take 500 mg by mouth daily.    No facility-administered encounter medications on file as of 12/04/2020.     Review of Systems  Review of Systems  N/a  Physical Exam  BP 128/68 (BP Location: Right Arm, Patient Position: Sitting, Cuff Size: Normal)   Pulse 70   Temp 98.3 F (36.8 C) (Oral)   Ht 5\' 2"  (1.575 m)   Wt 156 lb 12.8 oz (71.1 kg)   SpO2 95%   BMI 28.68 kg/m   Wt Readings from Last 5 Encounters:  12/04/20 156 lb 12.8 oz (71.1 kg)  04/18/20 154 lb 12.8 oz (70.2 kg)  10/19/19 137 lb 3.2 oz (62.2 kg)  10/08/19 135 lb (61.2 kg)  11/27/14 138 lb (62.6 kg)    BMI Readings from Last 5 Encounters:  12/04/20 28.68 kg/m  04/18/20 26.57 kg/m  10/19/19 23.55 kg/m  10/08/19 23.17 kg/m  05/07/15 23.69 kg/m     Physical Exam General: well appearing, in NAD Eyes: EOMI, no icterus Mouth: MMM, lips C/D/I Respiratory: mildly diminished lung sounds, CTAB, no wheeze Cardiovascular: RRR, no murmurs Abdomen: Non tender, BS present Musculoskeletal: No joint deformity, no synovitis Neuro: Normal gait, CN  intact Psych: Normal mood, full affect   Assessment & Plan:   Misty [name] is a 65 year old woman with COPD (fev1 58% 2015) whom we are seeing in Follow up for COPD.  COPD: Recent exacerbation 10/2020 seems triggered by seasonal allergies.  Less likely viral.  Watery eyes, runny nose, nasal congestion, atopic symptoms.  Respond to prolonged course of steroid.  To continue Advair and DuoNebs.  Triple inhaled therapy via 1 device was cost prohibitive.  Seasonal allergies: Worse in the fall and seem to trigger exacerbations.  Start montelukast and Zyrtec today.  Discussed using year-round.  If she stable after a year can go back to seasonal around time of exacerbations.  Return in about 3 months (around 03/05/2021).   14/02/2021, MD 12/04/2020

## 2020-12-04 NOTE — Patient Instructions (Addendum)
Nice to see you again  Get the Bivalent covid booster and get a flu shot  We will do pneumonia shot at next visit in December  Take zyrtec and montelukast at night for allergies  Return to clinic in 3 months or sooner as needed

## 2021-02-12 ENCOUNTER — Other Ambulatory Visit: Payer: Self-pay | Admitting: Pulmonary Disease

## 2021-02-12 DIAGNOSIS — J449 Chronic obstructive pulmonary disease, unspecified: Secondary | ICD-10-CM

## 2021-03-06 ENCOUNTER — Encounter: Payer: Self-pay | Admitting: Pulmonary Disease

## 2021-03-06 ENCOUNTER — Other Ambulatory Visit: Payer: Self-pay

## 2021-03-06 ENCOUNTER — Ambulatory Visit: Payer: Medicare HMO | Admitting: Pulmonary Disease

## 2021-03-06 VITALS — BP 122/68 | HR 86 | Temp 98.0°F | Ht 64.0 in | Wt 162.0 lb

## 2021-03-06 DIAGNOSIS — J449 Chronic obstructive pulmonary disease, unspecified: Secondary | ICD-10-CM

## 2021-03-06 NOTE — Progress Notes (Signed)
@Patient  ID: , female    DOB: [birth year], 65 y.o.   MRN: [id number]  Chief Complaint  Patient presents with   Follow-up    Pt states that her COPD is doing ok. But she does get short winded when walking due to the masks.     Referring provider: [id number], MD  HPI:  Misty [name] is a 65 y.o. woman with COPD (fev1 58% 2015) whom we are seeing in follow up for COPD.  Overall doing well.  No exacerbations last visit.  Endorses some weight gain.  She is worries related to the Zyrtec started last visit.  Nasal congestion somewhat better with Zyrtec and montelukast were both added.  She endorses some worsening shortness of breath with stairs.  She thinks it is related to the mass.  She notes that when she has a mask on she gets short of breath talking.  Otherwise does not get short of breath.  Hard for her to say if symptoms with inclines or stairs improved when she is not wearing mask she wears a K N95 most of the day when she is out and about.  Rarely at time of day when she does not wear the mask.  She is not using albuterol.  Not using DuoNebs regularly.  Reports good adherence to Advair.  HPI at initial visit: She was admitted 7/16 to 7/18 with sudden onset severe shortness of breath and DOE. Increase cough with sputum as well. Some mild wheezing per patient. Had been on prednisone and antibiotic the week before as outpatient but failed to significantly improve and subsequently worsened shortly after steroid burst. This prompted presentation to ED. ED notes reviewed. CXR on admission reviewed and interpreted as clear lungs, hyperinflated, L hemidiaphragm elevated.  She was found to be hypoxemic requiring 2L [place]. She was admitted and received IV then oral steroids and oral antibiotics. She improved and was discharged on longer steroid taper.   She reports slow improvement though not back to baseline. She is encouraged with improvement and expects to get back to  normal. She is using Wixela BID. Was on symbicort in past. Switched due to insurance. She is not sure if 8/18 is helping. Notes that for months to years prior to this exacerbation she had been doing well. Denies having limitations in activities.  Quit smoking 10 years ago, 60-80 pack year history.   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, 60-80 pack year history   PFT: PFT Results Latest Ref Rng & Units 07/16/2013  FVC-Pre L 2.80  FVC-Predicted Pre % 80  FVC-Post L 2.78  FVC-Predicted Post % 79  Pre FEV1/FVC % % 64  Post FEV1/FCV % % 64  FEV1-Pre L 1.81  FEV1-Predicted Pre % 66  FEV1-Post L 1.79  DLCO uncorrected ml/min/mmHg 12.63  DLCO UNC% % 49  DLVA Predicted % 53  TLC L 5.04  TLC % Predicted % 96  RV % Predicted % 102  Personally viewed interpreted as moderate fixed obstruction   Imaging: Personally reviewed and as per discussion in this note   Lab Results: Personally reviewed, abs EOS 300 04/2019 CBC    Component Value Date/Time   WBC 20.3 (H) 10/10/2019 0645   RBC 3.74 (L) 10/10/2019 0645   HGB 11.4 (L) 10/10/2019 0645   HCT 36.2 10/10/2019 0645   PLT 393  10/10/2019 0645   MCV 96.8 10/10/2019 0645   MCH 30.5 10/10/2019 0645   MCHC 31.5 10/10/2019 0645   RDW 12.9 10/10/2019 0645   LYMPHSABS 3.0 05/15/2009 1721   MONOABS 0.7 05/15/2009 1721   EOSABS 0.3 05/15/2009 1721   BASOSABS 0.0 05/15/2009 1721    BMET    Component Value Date/Time   NA 143 10/10/2019 0645   K 4.1 10/10/2019 0645   CL 109 10/10/2019 0645   CO2 24 10/10/2019 0645   GLUCOSE 112 (H) 10/10/2019 0645   BUN 13 10/10/2019 0645   CREATININE 0.89 10/10/2019 0645   CALCIUM 10.2 10/10/2019 0645   GFRNONAA >60 10/10/2019 0645   GFRAA >60 10/10/2019 0645    BNP No results found for: BNP  ProBNP No  results found for: PROBNP  Specialty Problems       Pulmonary Problems   COPD GOLD II     - Spirometry 2015  FEV1 58%, ratio 64% - Exacerbation requiring hospitalization 09/2019      COPD exacerbation (HCC)    Allergies  Allergen Reactions   Codeine Nausea Only and Other (See Comments)    "skin crawling"  Can take hydrocodone     Immunization History  Administered Date(s) Administered   Influenza Split 01/06/2020   Influenza Whole 01/05/2020   Influenza, Seasonal, Injecte, Preservative Fre 03/31/2017, 12/31/2017   Influenza,inj,Quad PF,6-35 Mos 12/06/2018, 12/27/2020   Influenza-Unspecified 12/23/2017   Moderna SARS-COV2 Booster Vaccination 11/24/2019   Moderna Sars-Covid-2 Vaccination 04/26/2019, 05/24/2019, 11/24/2019   PFIZER(Purple Top)SARS-COV-2 Vaccination 12/27/2020   Pneumococcal Polysaccharide-23 08/17/2014   Tdap 03/27/2007, 09/09/2019   Zoster Recombinat (Shingrix) 01/06/2020, 05/29/2020    Past Medical History:  Diagnosis Date   Anxiety    Bipolar disorder (HCC)    Dr. Evelene Croon   COPD (chronic obstructive pulmonary disease) (HCC) 2006   Depression    Hypothyroidism    Rheumatic fever    as a child, age 24yo   Urinary tract infection    hx/o   Wears glasses     Tobacco History: Social History   Tobacco Use  Smoking Status Former   Packs/day: 0.25   Years: 40.00   Pack years: 10.00   Types: Cigarettes   Quit date: 10/19/2015   Years since quitting: 5.3  Smokeless Tobacco Never   Counseling given: Not Answered   Continue to not smoke  Outpatient Encounter Medications as of 03/06/2021  Medication Sig   acetaminophen (TYLENOL) 500 MG tablet Take 1,000 mg by mouth every 4 (four) hours as needed for moderate pain.   albuterol (PROVENTIL HFA;VENTOLIN HFA) 108 (90 BASE) MCG/ACT inhaler Inhale 1 puff into the lungs every 6 (six) hours as needed for wheezing or shortness of breath (emergency).   atorvastatin (LIPITOR) 20 MG tablet Take 20 mg by  mouth daily.    baclofen (LIORESAL) 10 MG tablet TAKE 1 TABLET THREE TIMES DAILY   buPROPion (WELLBUTRIN XL) 150 MG 24 hr tablet Take 300 mg by mouth every morning.    cetirizine (ZYRTEC ALLERGY) 10 MG tablet Take 1 tablet (10 mg total) by mouth daily.   fluticasone-salmeterol (ADVAIR) 250-50 MCG/ACT AEPB INHALE 1 PUFF INTO THE LUNGS IN THE MORNING AND AT BEDTIME.   gabapentin (NEURONTIN) 600 MG tablet Take 300-900 mg by mouth 2 (two) times daily. Take 0.5 tablet (300 mg) in the morning and Take 1.5 (900 mg) in the evening   ipratropium-albuterol (DUONEB) 0.5-2.5 (3) MG/3ML SOLN Take 3 mLs by nebulization every 4 (four) hours as  needed.   lamoTRIgine (LAMICTAL) 200 MG tablet Take 200 mg by mouth at bedtime.    levothyroxine (SYNTHROID, LEVOTHROID) 75 MCG tablet Take 0.75 mcg by mouth daily.   montelukast (SINGULAIR) 10 MG tablet Take 1 tablet (10 mg total) by mouth at bedtime.   guaiFENesin (MUCINEX) 600 MG 12 hr tablet Take 600 mg by mouth 2 (two) times daily as needed for cough.   guaiFENesin (MUCINEX) 600 MG 12 hr tablet Take by mouth.   LORazepam (ATIVAN) 1 MG tablet Take 1 mg by mouth 3 (three) times daily as needed for anxiety.    valACYclovir (VALTREX) 500 MG tablet Take 500 mg by mouth daily.    No facility-administered encounter medications on file as of 03/06/2021.     Review of Systems  Review of Systems  N/a  Physical Exam  BP 122/68 (BP Location: Left Arm, Patient Position: Sitting, Cuff Size: Normal)    Pulse 86    Temp 98 F (36.7 C) (Oral)    Ht 5\' 4"  (1.626 m)    Wt 162 lb (73.5 kg)    SpO2 99%    BMI 27.81 kg/m   Wt Readings from Last 5 Encounters:  03/06/21 162 lb (73.5 kg)  12/04/20 156 lb 12.8 oz (71.1 kg)  04/18/20 154 lb 12.8 oz (70.2 kg)  10/19/19 137 lb 3.2 oz (62.2 kg)  10/08/19 135 lb (61.2 kg)    BMI Readings from Last 5 Encounters:  03/06/21 27.81 kg/m  12/04/20 28.68 kg/m  04/18/20 26.57 kg/m  10/19/19 23.55 kg/m  10/08/19 23.17 kg/m      Physical Exam General: well appearing, in NAD Eyes: EOMI, no icterus Mouth: MMM, lips C/D/I Respiratory: mildly diminished lung sounds, CTAB, no wheeze Cardiovascular: RRR, no murmurs Abdomen: Non tender, BS present Musculoskeletal: No joint deformity, no synovitis Neuro: Normal gait, CN intact Psych: Normal mood, full affect   Assessment & Plan:   Misty [name] is a 65 y.o. woman with COPD (fev1 58% 2015) whom we are seeing in Follow up for COPD.  COPD: last exacerbation 10/2020 seems triggered by seasonal allergies.  Less likely viral.  Watery eyes, runny nose, nasal congestion, atopic symptoms.  Respond to prolonged course of steroid.  To continue Advair and DuoNebs. Trial albuterol. Triple inhaled therapy via 1 device was cost prohibitive. Consider additon of LAMA if albuterol helps as not routinely using duonebs.  Seasonal allergies: Worse in the fall and seem to trigger exacerbations.  Continue montelukast. Stop zyrtec as she is concerned leading to weight gain and see if symptoms worsen.  HCM: UTD on flu, covid (bivalent booster 10/22). Pneumococcal 23 2016. Pneumococcal 20 today to complete series per recent updated CDC guidelines.   Return in about 6 months (around 09/04/2021).   09/06/2021, MD 03/06/2021

## 2021-03-06 NOTE — Patient Instructions (Addendum)
Nice to see you again  Use albuterol and see if it helps with some of the shortness of breath related to the mask  If the albuterol helps, call the office and I will likely add a new longer acting inhaler to see if it will help  Pneumonia shot today- will plan for a booster in 2023  Return to clinic in 6 months or sooner as needed with Dr. Judeth Horn

## 2021-04-10 ENCOUNTER — Telehealth: Payer: Self-pay | Admitting: Pulmonary Disease

## 2021-04-10 MED ORDER — ALBUTEROL SULFATE HFA 108 (90 BASE) MCG/ACT IN AERS: 1.0000 | INHALATION_SPRAY | Freq: Four times a day (QID) | RESPIRATORY_TRACT | 3 refills | Status: DC | PRN

## 2021-04-10 NOTE — Telephone Encounter (Signed)
Rx for pt's rescue inhaler has been sent to preferred pharmacy for pt. Called and spoke with pt letting her know this had been done and she verbalized understanding. Nothing further needed. ?

## 2021-10-04 ENCOUNTER — Telehealth: Payer: Self-pay

## 2021-10-04 ENCOUNTER — Ambulatory Visit: Payer: Medicare HMO | Admitting: Pulmonary Disease

## 2021-10-04 ENCOUNTER — Encounter: Payer: Self-pay | Admitting: Pulmonary Disease

## 2021-10-04 VITALS — BP 112/74 | HR 80 | Temp 98.0°F | Ht 64.0 in | Wt 167.0 lb

## 2021-10-04 DIAGNOSIS — J449 Chronic obstructive pulmonary disease, unspecified: Secondary | ICD-10-CM

## 2021-10-04 MED ORDER — SPIRIVA RESPIMAT 1.25 MCG/ACT IN AERS: 2.0000 | INHALATION_SPRAY | Freq: Every day | RESPIRATORY_TRACT | 0 refills | Status: DC

## 2021-10-04 NOTE — Addendum Note (Signed)
Addended by: Phillips Grout on: 10/04/2021 04:58 PM   Modules accepted: Orders

## 2021-10-04 NOTE — Telephone Encounter (Signed)
Hey ladies,  Can we run a ticket to see the cost for trelegy 100 and [employer] 1.25 for this patient.  Does she require a PA or anything of that nature?  Please and thank you ladies.

## 2021-10-04 NOTE — Progress Notes (Signed)
@Patient  ID: , female    DOB: [birth year], 66 y.o.   MRN: [id number]  Chief Complaint  Patient presents with   Follow-up    Increased chest tightness and SOB over the past 1-2 months. She feels that this is related to hot/humid weather. She is using her albuterol inhaler about once per wk.     Referring provider: [id number], MD  HPI:  Misty [name] is a 66 y.o. woman with COPD (fev1 58% 2015) whom we are seeing in follow up for COPD.  Overall doing well.  No exacerbations last visit.  Not using DuoNebs regularly. Using albuterol at least once a week. With exertion, stairs in particular. Reports good adherence to Advair.  Reports difficulty getting deep breath.  Not really chest tightness.  But the sensation of not a full breath.  Been ongoing.  Had hoped to use LAMA therapy via Trelegy but this is been cost prohibitive.  Discussed trial of LAMA therapy via Spiriva in addition to her current Advair.  HPI at initial visit: She was admitted 7/16 to 7/18 with sudden onset severe shortness of breath and DOE. Increase cough with sputum as well. Some mild wheezing per patient. Had been on prednisone and antibiotic the week before as outpatient but failed to significantly improve and subsequently worsened shortly after steroid burst. This prompted presentation to ED. ED notes reviewed. CXR on admission reviewed and interpreted as clear lungs, hyperinflated, L hemidiaphragm elevated.  She was found to be hypoxemic requiring 2L [place]. She was admitted and received IV then oral steroids and oral antibiotics. She improved and was discharged on longer steroid taper.   She reports slow improvement though not back to baseline. She is encouraged with improvement and expects to get back to normal. She is using Wixela BID. Was on symbicort in past. Switched due to insurance. She is not sure if 8/18 is helping. Notes that for months to years prior to this exacerbation she had been doing  well. Denies having limitations in activities.  Quit smoking 10 years ago, 60-80 pack year history.   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, 60-80 pack year history   PFT:    Latest Ref Rng & Units 07/16/2013   11:08 AM  PFT Results  FVC-Pre L 2.80   FVC-Predicted Pre % 80   FVC-Post L 2.78   FVC-Predicted Post % 79   Pre FEV1/FVC % % 64   Post FEV1/FCV % % 64   FEV1-Pre L 1.81   FEV1-Predicted Pre % 66   FEV1-Post L 1.79   DLCO uncorrected ml/min/mmHg 12.63   DLCO UNC% % 49   DLVA Predicted % 53   TLC L 5.04   TLC % Predicted % 96   RV % Predicted % 102   Personally viewed interpreted as moderate fixed obstruction   Imaging: Personally reviewed and as per discussion in this note   Lab Results: Personally reviewed, abs EOS 300 04/2019 CBC    Component Value Date/Time   WBC 20.3 (H) 10/10/2019 0645   RBC 3.74 (L) 10/10/2019 0645   HGB 11.4 (L) 10/10/2019 0645   HCT 36.2 10/10/2019 0645   PLT 393 10/10/2019 0645   MCV 96.8 10/10/2019 0645   MCH 30.5 10/10/2019 0645   MCHC 31.5 10/10/2019 0645   RDW 12.9 10/10/2019 0645   LYMPHSABS  3.0 05/15/2009 1721   MONOABS 0.7 05/15/2009 1721   EOSABS 0.3 05/15/2009 1721   BASOSABS 0.0 05/15/2009 1721    BMET    Component Value Date/Time   NA 143 10/10/2019 0645   K 4.1 10/10/2019 0645   CL 109 10/10/2019 0645   CO2 24 10/10/2019 0645   GLUCOSE 112 (H) 10/10/2019 0645   BUN 13 10/10/2019 0645   CREATININE 0.89 10/10/2019 0645   CALCIUM 10.2 10/10/2019 0645   GFRNONAA >60 10/10/2019 0645   GFRAA >60 10/10/2019 0645    BNP No results found for: "BNP"  ProBNP No results found for: "PROBNP"  Specialty Problems       Pulmonary Problems   COPD GOLD II     - Spirometry 2015  FEV1 58%, ratio 64% - Exacerbation requiring  hospitalization 09/2019      COPD exacerbation (HCC)    Allergies  Allergen Reactions   Codeine Nausea Only and Other (See Comments)    "skin crawling"  Can take hydrocodone     Immunization History  Administered Date(s) Administered   Influenza Split 01/06/2020   Influenza Whole 01/05/2020   Influenza, Seasonal, Injecte, Preservative Fre 03/31/2017, 12/31/2017   Influenza,inj,Quad PF,6-35 Mos 12/06/2018, 12/27/2020   Influenza-Unspecified 12/23/2017   Moderna SARS-COV2 Booster Vaccination 11/24/2019   Moderna Sars-Covid-2 Vaccination 04/26/2019, 05/24/2019, 11/24/2019   PFIZER(Purple Top)SARS-COV-2 Vaccination 12/27/2020   Pneumococcal Polysaccharide-23 08/17/2014   Tdap 03/27/2007, 09/09/2019   Zoster Recombinat (Shingrix) 01/06/2020, 05/29/2020    Past Medical History:  Diagnosis Date   Anxiety    Bipolar disorder (HCC)    Dr. Evelene Croon   COPD (chronic obstructive pulmonary disease) (HCC) 2006   Depression    Hypothyroidism    Rheumatic fever    as a child, age 49yo   Urinary tract infection    hx/o   Wears glasses     Tobacco History: Social History   Tobacco Use  Smoking Status Former   Packs/day: 0.25   Years: 40.00   Total pack years: 10.00   Types: Cigarettes   Quit date: 10/19/2015   Years since quitting: 5.9  Smokeless Tobacco Never   Counseling given: Not Answered   Continue to not smoke  Outpatient Encounter Medications as of 10/04/2021  Medication Sig   acetaminophen (TYLENOL) 500 MG tablet Take 1,000 mg by mouth every 4 (four) hours as needed for moderate pain.   albuterol (VENTOLIN HFA) 108 (90 Base) MCG/ACT inhaler Inhale 1 puff into the lungs every 6 (six) hours as needed for wheezing or shortness of breath (emergency).   atorvastatin (LIPITOR) 20 MG tablet Take 20 mg by mouth daily.    baclofen (LIORESAL) 10 MG tablet TAKE 1 TABLET THREE TIMES DAILY   buPROPion (WELLBUTRIN XL) 150 MG 24 hr tablet Take 300 mg by mouth every morning.     cetirizine (ZYRTEC) 10 MG tablet Take 10 mg by mouth daily.   diazepam (VALIUM) 5 MG tablet Take 1 tablet by mouth in the morning and at bedtime.   fluticasone-salmeterol (ADVAIR) 250-50 MCG/ACT AEPB INHALE 1 PUFF INTO THE LUNGS IN THE MORNING AND AT BEDTIME.   gabapentin (NEURONTIN) 600 MG tablet Take 300-900 mg by mouth 2 (two) times daily. Take 0.5 tablet (300 mg) in the morning and Take 1.5 (900 mg) in the evening   guaiFENesin (MUCINEX) 600 MG 12 hr tablet Take 600 mg by mouth 2 (two) times daily as needed for cough.   ipratropium-albuterol (DUONEB) 0.5-2.5 (3) MG/3ML SOLN Take 3 mLs  by nebulization every 4 (four) hours as needed.   lamoTRIgine (LAMICTAL) 200 MG tablet Take 200 mg by mouth at bedtime.    levothyroxine (SYNTHROID, LEVOTHROID) 75 MCG tablet Take 0.75 mcg by mouth daily.   montelukast (SINGULAIR) 10 MG tablet Take 1 tablet (10 mg total) by mouth at bedtime.   No facility-administered encounter medications on file as of 10/04/2021.     Review of Systems  Review of Systems  N/a  Physical Exam  BP 112/74 (BP Location: Left Arm, Cuff Size: Normal)   Pulse 80   Temp 98 F (36.7 C) (Oral)   Ht 5\' 4"  (1.626 m)   Wt 167 lb (75.8 kg)   SpO2 95%   BMI 28.67 kg/m   Wt Readings from Last 5 Encounters:  10/04/21 167 lb (75.8 kg)  03/06/21 162 lb (73.5 kg)  12/04/20 156 lb 12.8 oz (71.1 kg)  04/18/20 154 lb 12.8 oz (70.2 kg)  10/19/19 137 lb 3.2 oz (62.2 kg)    BMI Readings from Last 5 Encounters:  10/04/21 28.67 kg/m  03/06/21 27.81 kg/m  12/04/20 28.68 kg/m  04/18/20 26.57 kg/m  10/19/19 23.55 kg/m     Physical Exam General: well appearing, in NAD Eyes: EOMI, no icterus Mouth: MMM, lips C/D/I Respiratory: mildly diminished lung sounds, CTAB, no wheeze Cardiovascular: RRR, no murmurs Abdomen: Non tender, BS present Musculoskeletal: No joint deformity, no synovitis Neuro: Normal gait, CN intact Psych: Normal mood, full affect   Assessment & Plan:    Misty [name] is a 66 y.o. woman with COPD (fev1 58% 2015) whom we are seeing in Follow up for COPD.  COPD: last exacerbation 10/2020 seems triggered by seasonal allergies.  Less likely viral.  Watery eyes, runny nose, nasal congestion, atopic symptoms.  Respond to prolonged course of steroid.  To continue Advair and DuoNebs.  Albuterol has been helpful.  Using at least once a week.  I suspect closer to daily.  Unfortunate, Trelegy has been cost prohibitive.  Do think she would benefit from additional bronchodilation.  Add Spiriva 2 puffs once daily today.  If helpful will provide ongoing prescription for this.  We will also reengage our pharmacy team to see if Trelegy more affordable now in the new year since last visit.  Seasonal allergies: Worse in the fall and seem to trigger exacerbations.  Continue montelukast.  Zyrtec stopped 12/22 as some concern for weight gain.  HCM: UTD on flu, covid (bivalent booster 10/22). Pneumococcal 23 2016. Pneumococcal 20 in  2022 (age 73).  Return in about 6 months (around 04/06/2022).   04/08/2022, MD 10/04/2021

## 2021-10-04 NOTE — Patient Instructions (Signed)
Nice to see y ou!  Try Spiriva 2 puffs every day in morning  We will send message regarding cost to our pharmacy team  Send me a message in a couple of weeks to let me know if the Spiriva helps or not  RTC in 6 months with Dr. Judeth Horn

## 2021-10-09 ENCOUNTER — Other Ambulatory Visit (HOSPITAL_COMMUNITY): Payer: Self-pay

## 2021-10-09 NOTE — Telephone Encounter (Signed)
Think the Trelegy would be better as would be single copay. Would replace Advair and Spriva and combine into single inhaler.

## 2021-10-09 NOTE — Telephone Encounter (Signed)
Hey you asked me to reach out to pharmacy about this patient last week.   This is their reply to the medications you asked for sir:  Good morning! The copay for Trelegy is $10 and the Spiriva also has a $10 copay.

## 2021-10-10 MED ORDER — TRELEGY ELLIPTA 100-62.5-25 MCG/ACT IN AEPB: 1.0000 | INHALATION_SPRAY | Freq: Every day | RESPIRATORY_TRACT | 3 refills | Status: DC

## 2021-10-10 NOTE — Telephone Encounter (Signed)
Oh sorry - low dose. Yes I agree with your instructions. Thanks!

## 2021-10-10 NOTE — Telephone Encounter (Signed)
Called and spoke to patient about the change in therapy. Patient verbalized understanding. Nothing further needed

## 2021-10-10 NOTE — Telephone Encounter (Signed)
MH,  Would you like high dose or low dose Trelegy? And since we only gave her a sample I am just going to tell her to finish the sample before she picks this up from her pharmacy if that's ok with you?  Please advise sir

## 2021-10-19 ENCOUNTER — Other Ambulatory Visit: Payer: Self-pay | Admitting: Pulmonary Disease

## 2021-10-19 DIAGNOSIS — J302 Other seasonal allergic rhinitis: Secondary | ICD-10-CM

## 2022-01-31 ENCOUNTER — Other Ambulatory Visit: Payer: Self-pay | Admitting: Pulmonary Disease

## 2022-02-01 NOTE — Telephone Encounter (Signed)
She should have enough refills on this medication. I ordered it in July and placed 10 refills on it. Thank you

## 2022-02-04 ENCOUNTER — Telehealth: Payer: Self-pay | Admitting: Pulmonary Disease

## 2022-02-04 MED ORDER — TRELEGY ELLIPTA 100-62.5-25 MCG/ACT IN AEPB: 1.0000 | INHALATION_SPRAY | Freq: Every day | RESPIRATORY_TRACT | 3 refills | Status: DC

## 2022-02-04 NOTE — Telephone Encounter (Signed)
Rx for pt's Trelegy has been sent to preferred pharmacy. Called and spoke with pt letting her know this had been done and she verbalized understanding. Nothing further needed.

## 2022-04-02 ENCOUNTER — Ambulatory Visit: Payer: Medicare HMO | Admitting: Pulmonary Disease

## 2022-04-02 ENCOUNTER — Encounter: Payer: Self-pay | Admitting: Pulmonary Disease

## 2022-04-02 VITALS — BP 120/64 | HR 73 | Wt 170.6 lb

## 2022-04-02 DIAGNOSIS — J449 Chronic obstructive pulmonary disease, unspecified: Secondary | ICD-10-CM

## 2022-04-02 NOTE — Progress Notes (Signed)
@Patient  ID: , female    DOB: [birth year], 67 y.o.   MRN: [id number]  Chief Complaint  Patient presents with   Follow-up    Pt is here for follow up for COPD. Pt states that she is seeing a huge difference with the Trelegy. And ventolin as needed     Referring provider: [id number], MD  HPI:  Ms. Paone is a 67 y.o. woman with COPD (fev1 58% 2015) whom we are seeing in follow up for COPD.  Overall doing well.  No exacerbations since last visit.  Escalated Advair to low dose trelegy 09/2021, last visit, due to DOE.  She states this is helped mainly her dyspnea exertion but also with cough.  Overall doing quite well.  Rare albuterol use.  She had her RSV vaccine last week.  Little bit of a thin white sputum with worsening cough since then.  Not sure reflected that her may be allergies.  She is not really concerned about it.  She had CT lung cancer screening done in interim, result reviewed 03/2022 lung RADS 1.  HPI at initial visit: She was admitted 7/16 to 7/18 with sudden onset severe shortness of breath and DOE. Increase cough with sputum as well. Some mild wheezing per patient. Had been on prednisone and antibiotic the week before as outpatient but failed to significantly improve and subsequently worsened shortly after steroid burst. This prompted presentation to ED. ED notes reviewed. CXR on admission reviewed and interpreted as clear lungs, hyperinflated, L hemidiaphragm elevated.  She was found to be hypoxemic requiring 2L [place]. She was admitted and received IV then oral steroids and oral antibiotics. She improved and was discharged on longer steroid taper.   She reports slow improvement though not back to baseline. She is encouraged with improvement and expects to get back to normal. She is using Wixela BID. Was on symbicort in past. Switched due to insurance. She is not sure if 8/18 is helping. Notes that for months to years prior to this exacerbation she had  been doing well. Denies having limitations in activities.  Quit smoking 10 years ago, 60-80 pack year history.   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, 60-80 pack year history   PFT:    Latest Ref Rng & Units 07/16/2013   11:08 AM  PFT Results  FVC-Pre L 2.80   FVC-Predicted Pre % 80   FVC-Post L 2.78   FVC-Predicted Post % 79   Pre FEV1/FVC % % 64   Post FEV1/FCV % % 64   FEV1-Pre L 1.81   FEV1-Predicted Pre % 66   FEV1-Post L 1.79   DLCO uncorrected ml/min/mmHg 12.63   DLCO UNC% % 49   DLVA Predicted % 53   TLC L 5.04   TLC % Predicted % 96   RV % Predicted % 102   Personally viewed interpreted as moderate fixed obstruction   Imaging: Personally reviewed and as per discussion in this note   Lab Results: Personally reviewed, abs EOS 300 04/2019 CBC    Component Value Date/Time   WBC 20.3 (H) 10/10/2019 0645   RBC 3.74 (L) 10/10/2019 0645   HGB 11.4 (L) 10/10/2019 0645   HCT 36.2 10/10/2019 0645   PLT 393 10/10/2019 0645   MCV 96.8 10/10/2019 0645   MCH 30.5 10/10/2019 0645   MCHC  31.5 10/10/2019 0645   RDW 12.9 10/10/2019 0645   LYMPHSABS 3.0 05/15/2009 1721   MONOABS 0.7 05/15/2009 1721   EOSABS 0.3 05/15/2009 1721   BASOSABS 0.0 05/15/2009 1721    BMET    Component Value Date/Time   NA 143 10/10/2019 0645   K 4.1 10/10/2019 0645   CL 109 10/10/2019 0645   CO2 24 10/10/2019 0645   GLUCOSE 112 (H) 10/10/2019 0645   BUN 13 10/10/2019 0645   CREATININE 0.89 10/10/2019 0645   CALCIUM 10.2 10/10/2019 0645   GFRNONAA >60 10/10/2019 0645   GFRAA >60 10/10/2019 0645    BNP No results found for: "BNP"  ProBNP No results found for: "PROBNP"  Specialty Problems       Pulmonary Problems   COPD GOLD II     - Spirometry 2015  FEV1 58%, ratio 64% - Exacerbation  requiring hospitalization 09/2019      COPD exacerbation (HCC)    Allergies  Allergen Reactions   Codeine Nausea Only and Other (See Comments)    "skin crawling"  Can take hydrocodone     Immunization History  Administered Date(s) Administered   Fluad Quad(high Dose 65+) 01/17/2022   Influenza Split 01/06/2020   Influenza Whole 01/05/2020   Influenza, Seasonal, Injecte, Preservative Fre 03/31/2017, 12/31/2017   Influenza,inj,Quad PF,6-35 Mos 12/06/2018, 12/31/2019, 12/27/2020   Influenza-Unspecified 12/23/2017   Moderna SARS-COV2 Booster Vaccination 11/24/2019   Moderna Sars-Covid-2 Vaccination 04/26/2019, 05/24/2019, 11/24/2019   PFIZER(Purple Top)SARS-COV-2 Vaccination 12/27/2020   Pneumococcal Polysaccharide-23 08/17/2014   Respiratory Syncytial Virus Vaccine,Recomb Aduvanted(Arexvy) 03/26/2022   Tdap 03/27/2007, 09/09/2019   Zoster Recombinat (Shingrix) 01/06/2020, 05/29/2020    Past Medical History:  Diagnosis Date   Anxiety    Bipolar disorder (Early)    Dr. Toy Care   COPD (chronic obstructive pulmonary disease) ([place]) 2006   Depression    Hypothyroidism    Rheumatic fever    as a child, age 66yo   Urinary tract infection    hx/o   Wears glasses     Tobacco History: Social History   Tobacco Use  Smoking Status Former   Packs/day: 0.25   Years: 40.00   Total pack years: 10.00   Types: Cigarettes   Quit date: 10/19/2015   Years since quitting: 6.4  Smokeless Tobacco Never   Counseling given: Not Answered   Continue to not smoke  Outpatient Encounter Medications as of 04/02/2022  Medication Sig   acetaminophen (TYLENOL) 500 MG tablet Take 1,000 mg by mouth every 4 (four) hours as needed for moderate pain.   albuterol (VENTOLIN HFA) 108 (90 Base) MCG/ACT inhaler Inhale 1 puff into the lungs every 6 (six) hours as needed for wheezing or shortness of breath (emergency).   atorvastatin (LIPITOR) 20 MG tablet Take 20 mg by mouth daily.    baclofen (LIORESAL)  10 MG tablet TAKE 1 TABLET THREE TIMES DAILY   buPROPion (WELLBUTRIN XL) 150 MG 24 hr tablet Take 300 mg by mouth every morning.    cetirizine (ZYRTEC) 10 MG tablet Take 10 mg by mouth daily.   diazepam (VALIUM) 5 MG tablet Take 1 tablet by mouth in the morning and at bedtime.   Fluticasone-Umeclidin-Vilant (TRELEGY ELLIPTA) 100-62.5-25 MCG/ACT AEPB Inhale 1 puff into the lungs daily.   gabapentin (NEURONTIN) 600 MG tablet Take 300-900 mg by mouth 2 (two) times daily. Take 0.5 tablet (300 mg) in the morning and Take 1.5 (900 mg) in the evening   guaiFENesin (MUCINEX) 600 MG 12 hr tablet  Take 600 mg by mouth 2 (two) times daily as needed for cough.   ipratropium-albuterol (DUONEB) 0.5-2.5 (3) MG/3ML SOLN Take 3 mLs by nebulization every 4 (four) hours as needed.   lamoTRIgine (LAMICTAL) 200 MG tablet Take 200 mg by mouth at bedtime.    levothyroxine (SYNTHROID, LEVOTHROID) 75 MCG tablet Take 0.75 mcg by mouth daily.   montelukast (SINGULAIR) 10 MG tablet TAKE 1 TABLET AT BEDTIME   No facility-administered encounter medications on file as of 04/02/2022.     Review of Systems  Review of Systems  N/a  Physical Exam  BP 120/64 (BP Location: Left Arm, Patient Position: Sitting, Cuff Size: Normal)   Pulse 73   Wt 170 lb 9.6 oz (77.4 kg)   SpO2 97%   BMI 29.28 kg/m   Wt Readings from Last 5 Encounters:  04/02/22 170 lb 9.6 oz (77.4 kg)  10/04/21 167 lb (75.8 kg)  03/06/21 162 lb (73.5 kg)  12/04/20 156 lb 12.8 oz (71.1 kg)  04/18/20 154 lb 12.8 oz (70.2 kg)    BMI Readings from Last 5 Encounters:  04/02/22 29.28 kg/m  10/04/21 28.67 kg/m  03/06/21 27.81 kg/m  12/04/20 28.68 kg/m  04/18/20 26.57 kg/m     Physical Exam General: well appearing, in NAD Eyes: EOMI, no icterus Mouth: MMM, lips C/D/I Respiratory: mildly diminished lung sounds, CTAB, no wheeze Cardiovascular: RRR, no murmurs Abdomen: Non tender, BS present Musculoskeletal: No joint deformity, no  synovitis Neuro: Normal gait, CN intact Psych: Normal mood, full affect   Assessment & Plan:   Ms. Gulledge is a 67 y.o. woman with COPD (fev1 58% 2015) whom we are seeing in Follow up for COPD.  COPD: last exacerbation 10/2020 seems triggered by seasonal allergies.  Less likely viral.  Watery eyes, runny nose, nasal congestion, atopic symptoms.  Respond to prolonged course of steroid.  Had intermittent chest tightness, relatively frequent albuterol use on Advair and DuoNebs as needed.  Escalated Trelegy at last visit.  Improvement in symptoms.  Rare albuterol use.  To continue Trelegy.  Seasonal allergies: Worse in the fall and seem to trigger exacerbations.  Continue montelukast.  Zyrtec stopped 12/22 as some concern for weight gain.  HCM: UTD on flu, covid ( booster fall 2023). Pneumococcal 23 2016. Pneumococcal 20 in  2022 (age 22), RSV (03/2022).  Low-dose CT scan lung RADS 1 03/2022.  Return in about 6 months (around 10/01/2022).   Karren Burly, MD 04/02/2022

## 2022-04-02 NOTE — Patient Instructions (Signed)
I am glad the Trelegy has helped, please continue  No changes to medications  I am glad the CT scan looked good  Return to clinic in 6 months or sooner

## 2022-04-18 ENCOUNTER — Other Ambulatory Visit: Payer: Self-pay | Admitting: Pulmonary Disease

## 2022-10-10 ENCOUNTER — Ambulatory Visit: Payer: Medicare HMO | Admitting: Pulmonary Disease

## 2022-10-10 ENCOUNTER — Encounter: Payer: Self-pay | Admitting: Pulmonary Disease

## 2022-10-10 VITALS — BP 114/74 | HR 75

## 2022-10-10 DIAGNOSIS — J302 Other seasonal allergic rhinitis: Secondary | ICD-10-CM | POA: Diagnosis not present

## 2022-10-10 DIAGNOSIS — J449 Chronic obstructive pulmonary disease, unspecified: Secondary | ICD-10-CM

## 2022-10-10 MED ORDER — ALBUTEROL SULFATE HFA 108 (90 BASE) MCG/ACT IN AERS: 2.0000 | INHALATION_SPRAY | Freq: Four times a day (QID) | RESPIRATORY_TRACT | 3 refills | Status: DC | PRN

## 2022-10-10 MED ORDER — TRELEGY ELLIPTA 100-62.5-25 MCG/ACT IN AEPB: 1.0000 | INHALATION_SPRAY | Freq: Every day | RESPIRATORY_TRACT | 3 refills | Status: DC

## 2022-10-10 MED ORDER — MONTELUKAST SODIUM 10 MG PO TABS
10.0000 mg | ORAL_TABLET | Freq: Every day | ORAL | 3 refills | Status: DC
Start: 2022-10-10 — End: 2023-06-17

## 2022-10-10 NOTE — Progress Notes (Signed)
@Patient  ID: Misty [name], female    DOB: [birth year], 67 y.o.   MRN: [id number]  Chief Complaint  Patient presents with   Follow-up    6 mo f/u for COPD. States her breathing has been stable since last visit.     Referring provider: Eartha Inch, MD  HPI:  Ms. Misty [name] is a 67 y.o. woman with COPD (fev1 58% 2015) whom we are seeing in follow up for COPD.  Overall doing well.  1 exacerbations since last visit but sounded more sinus related.  This is overall stable.  Heat makes things a bit worse.  HPI at initial visit: She was admitted 7/16 to 7/18 with sudden onset severe shortness of breath and DOE. Increase cough with sputum as well. Some mild wheezing per patient. Had been on prednisone and antibiotic the week before as outpatient but failed to significantly improve and subsequently worsened shortly after steroid burst. This prompted presentation to ED. ED notes reviewed. CXR on admission reviewed and interpreted as clear lungs, hyperinflated, L hemidiaphragm elevated.  She was found to be hypoxemic requiring 2L [place]. She was admitted and received IV then oral steroids and oral antibiotics. She improved and was discharged on longer steroid taper.   She reports slow improvement though not back to baseline. She is encouraged with improvement and expects to get back to normal. She is using Wixela BID. Was on symbicort in past. Switched due to insurance. She is not sure if [place] is helping. Notes that for months to years prior to this exacerbation she had been doing well. Denies having limitations in activities.  Quit smoking 10 years ago, 60-80 pack year history.   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Theatre manager, used to clean apartments, now retired, does not currently smoke, 60-80 pack year history   PFT:    Latest Ref Rng & Units 07/16/2013   11:08 AM  PFT  Results  FVC-Pre L 2.80   FVC-Predicted Pre % 80   FVC-Post L 2.78   FVC-Predicted Post % 79   Pre FEV1/FVC % % 64   Post FEV1/FCV % % 64   FEV1-Pre L 1.81   FEV1-Predicted Pre % 66   FEV1-Post L 1.79   DLCO uncorrected ml/min/mmHg 12.63   DLCO UNC% % 49   DLVA Predicted % 53   TLC L 5.04   TLC % Predicted % 96   RV % Predicted % 102   Personally viewed interpreted as moderate fixed obstruction   Imaging: Personally reviewed and as per discussion in this note   Lab Results: Personally reviewed, abs EOS 300 04/2019 CBC    Component Value Date/Time   WBC 20.3 (H) 10/10/2019 0645   RBC 3.74 (L) 10/10/2019 0645   HGB 11.4 (L) 10/10/2019 0645   HCT 36.2 10/10/2019 0645   PLT 393 10/10/2019 0645   MCV 96.8 10/10/2019 0645   MCH 30.5 10/10/2019 0645   MCHC 31.5 10/10/2019 0645   RDW 12.9 10/10/2019 0645   LYMPHSABS 3.0 05/15/2009 1721   MONOABS 0.7 05/15/2009 1721   EOSABS 0.3 05/15/2009 1721   BASOSABS 0.0 05/15/2009 1721    BMET    Component Value Date/Time   NA 143 10/10/2019 0645   K 4.1 10/10/2019 0645   CL 109 10/10/2019 0645   CO2 24 10/10/2019 0645   GLUCOSE 112 (H) 10/10/2019 0645   BUN 13 10/10/2019 0645   CREATININE  0.89 10/10/2019 0645   CALCIUM 10.2 10/10/2019 0645   GFRNONAA >60 10/10/2019 0645   GFRAA >60 10/10/2019 0645    BNP No results found for: "BNP"  ProBNP No results found for: "PROBNP"  Specialty Problems       Pulmonary Problems   COPD GOLD II     - Spirometry 2015  FEV1 58%, ratio 64% - Exacerbation requiring hospitalization 09/2019      COPD exacerbation (HCC)    Allergies  Allergen Reactions   Codeine Nausea Only and Other (See Comments)    "skin crawling"  Can take hydrocodone     Immunization History  Administered Date(s) Administered   Fluad Quad(high Dose 65+) 01/17/2022   Influenza Split 01/06/2020   Influenza Whole 01/05/2020   Influenza, Seasonal, Injecte, Preservative Fre 03/31/2017, 12/31/2017    Influenza,inj,Quad PF,6-35 Mos 12/06/2018, 12/31/2019, 12/27/2020   Influenza-Unspecified 12/23/2017   Moderna SARS-COV2 Booster Vaccination 11/24/2019   Moderna Sars-Covid-2 Vaccination 04/26/2019, 05/24/2019, 11/24/2019   PFIZER(Purple Top)SARS-COV-2 Vaccination 12/27/2020   Pneumococcal Polysaccharide-23 08/17/2014   Respiratory Syncytial Virus Vaccine,Recomb Aduvanted(Arexvy) 03/26/2022   Tdap 03/27/2007, 09/09/2019   Zoster Recombinant(Shingrix) 01/06/2020, 05/29/2020    Past Medical History:  Diagnosis Date   Anxiety    Bipolar disorder (HCC)    Dr. Evelene Croon   COPD (chronic obstructive pulmonary disease) (HCC) 2006   Depression    Hypothyroidism    Rheumatic fever    as a child, age 50yo   Urinary tract infection    hx/o   Wears glasses     Tobacco History: Social History   Tobacco Use  Smoking Status Former   Current packs/day: 0.00   Average packs/day: 0.3 packs/day for 40.0 years (10.0 ttl pk-yrs)   Types: Cigarettes   Start date: 10/19/1975   Quit date: 10/19/2015   Years since quitting: 6.9  Smokeless Tobacco Never   Counseling given: Not Answered   Continue to not smoke  Outpatient Encounter Medications as of 10/10/2022  Medication Sig   acetaminophen (TYLENOL) 500 MG tablet Take 1,000 mg by mouth every 4 (four) hours as needed for moderate pain.   atorvastatin (LIPITOR) 20 MG tablet Take 20 mg by mouth daily.    baclofen (LIORESAL) 10 MG tablet TAKE 1 TABLET THREE TIMES DAILY   buPROPion (WELLBUTRIN XL) 150 MG 24 hr tablet Take 300 mg by mouth every morning.    diazepam (VALIUM) 5 MG tablet Take 1 tablet by mouth in the morning and at bedtime.   fluticasone (FLONASE) 50 MCG/ACT nasal spray Place 2 sprays into both nostrils.   gabapentin (NEURONTIN) 600 MG tablet Take 300-900 mg by mouth 2 (two) times daily. Take 0.5 tablet (300 mg) in the morning and Take 1.5 (900 mg) in the evening   guaiFENesin (MUCINEX) 600 MG 12 hr tablet Take 600 mg by mouth 2 (two)  times daily as needed for cough.   ipratropium-albuterol (DUONEB) 0.5-2.5 (3) MG/3ML SOLN Take 3 mLs by nebulization every 4 (four) hours as needed.   lamoTRIgine (LAMICTAL) 200 MG tablet Take 200 mg by mouth at bedtime.    levothyroxine (SYNTHROID, LEVOTHROID) 75 MCG tablet Take 0.75 mcg by mouth daily.   loratadine (CLARITIN) 10 MG tablet Take 10 mg by mouth daily.   lurasidone (LATUDA) 20 MG TABS tablet Take 20 mg by mouth daily.   [DISCONTINUED] albuterol (VENTOLIN HFA) 108 (90 Base) MCG/ACT inhaler INHALE 1 PUFF EVERY 6 HOURS AS NEEDED FOR WHEEZING OR SHORTNESS OF BREATH (EMERGENCY).   [DISCONTINUED] Fluticasone-Umeclidin-Vilant (TRELEGY  ELLIPTA) 100-62.5-25 MCG/ACT AEPB Inhale 1 puff into the lungs daily.   [DISCONTINUED] montelukast (SINGULAIR) 10 MG tablet TAKE 1 TABLET AT BEDTIME   albuterol (VENTOLIN HFA) 108 (90 Base) MCG/ACT inhaler Inhale 2 puffs into the lungs every 6 (six) hours as needed for wheezing or shortness of breath.   Fluticasone-Umeclidin-Vilant (TRELEGY ELLIPTA) 100-62.5-25 MCG/ACT AEPB Inhale 1 puff into the lungs daily.   montelukast (SINGULAIR) 10 MG tablet Take 1 tablet (10 mg total) by mouth at bedtime.   [DISCONTINUED] cetirizine (ZYRTEC) 10 MG tablet Take 10 mg by mouth daily.   No facility-administered encounter medications on file as of 10/10/2022.     Review of Systems  Review of Systems  N/a  Physical Exam  BP 114/74   Pulse 75   SpO2 96% Comment: on RA  Wt Readings from Last 5 Encounters:  04/02/22 170 lb 9.6 oz (77.4 kg)  10/04/21 167 lb (75.8 kg)  03/06/21 162 lb (73.5 kg)  12/04/20 156 lb 12.8 oz (71.1 kg)  04/18/20 154 lb 12.8 oz (70.2 kg)    BMI Readings from Last 5 Encounters:  04/02/22 29.28 kg/m  10/04/21 28.67 kg/m  03/06/21 27.81 kg/m  12/04/20 28.68 kg/m  04/18/20 26.57 kg/m     Physical Exam General: well appearing, in NAD Eyes: EOMI, no icterus Mouth: MMM, lips C/D/I Respiratory: mildly diminished lung sounds,  CTAB, no wheeze Cardiovascular: RRR, no murmurs Abdomen: Non tender, BS present Musculoskeletal: No joint deformity, no synovitis Neuro: Normal gait, CN intact Psych: Normal mood, full affect   Assessment & Plan:   Ms. Mahan is a 67 y.o. woman with COPD (fev1 58% 2015) whom we are seeing in Follow up for COPD.  COPD: last exacerbation 10/2020 seems triggered by seasonal allergies.  Less likely viral.  Watery eyes, runny nose, nasal congestion, atopic symptoms.  Respond to prolonged course of steroid.  Had intermittent chest tightness, relatively frequent albuterol use on Advair and DuoNebs as needed.  Escalated Trelegy with overall marked improvement in symptoms.  Trelegy refilled today.  Albuterol refilled today.  Seasonal allergies: Worse in the fall and seem to trigger exacerbations.  Continue montelukast, refilled today.  Zyrtec stopped 12/22 as some concern for weight gain.  HCM: UTD on flu, covid ( booster fall 2023). Pneumococcal 23 2016. Pneumococcal 20 in  2022 (age 66), RSV (03/2022).  Low-dose CT scan lung RADS 1 03/2022.  Return in about 1 year (around 10/10/2023).   Karren Burly, MD 10/10/2022

## 2022-10-10 NOTE — Patient Instructions (Addendum)
Nice to see you again  I refilled the Trelegy  I sent albuterol locally  I refilled the Singulair  Return to clinic in 1 year or sooner as needed

## 2023-05-16 ENCOUNTER — Ambulatory Visit: Payer: Medicare HMO | Admitting: Obstetrics and Gynecology

## 2023-06-17 ENCOUNTER — Telehealth: Payer: Self-pay | Admitting: Pulmonary Disease

## 2023-06-17 DIAGNOSIS — J302 Other seasonal allergic rhinitis: Secondary | ICD-10-CM

## 2023-06-17 MED ORDER — MONTELUKAST SODIUM 10 MG PO TABS
10.0000 mg | ORAL_TABLET | Freq: Every day | ORAL | 3 refills | Status: DC
Start: 2023-06-17 — End: 2023-11-18

## 2023-06-17 NOTE — Telephone Encounter (Signed)
 Singulair has been sent to preferred pharmacy. Left detailed message. Nothing further needed.

## 2023-06-17 NOTE — Telephone Encounter (Signed)
 Patient needs medication refill on Montelukast.  Pharmacy: [facility]

## 2023-07-11 ENCOUNTER — Other Ambulatory Visit: Payer: Self-pay | Admitting: Pulmonary Disease

## 2023-07-11 NOTE — Telephone Encounter (Signed)
 Last Fill: 10/10/22  Last OV: 10/10/22 Next OV: None Scheduled  Routing to provider for review/authorization.

## 2023-07-11 NOTE — Telephone Encounter (Unsigned)
 Copied from CRM [id number]. Topic: Clinical - Medication Refill >> Jul 11, 2023 12:20 PM Whitney O wrote: Most Recent Primary Care Visit:   Medication: Fluticasone -Umeclidin-Vilant (TRELEGY ELLIPTA ) 100-62.5-25 MCG/ACT AEPB  Has the patient contacted their pharmacy? No they told her to reach out to her doctor  (Agent: If no, request that the patient contact the pharmacy for the refill. If patient does not wish to contact the pharmacy document the reason why and proceed with request.) (Agent: If yes, when and what did the pharmacy advise?)  Is this the correct pharmacy for this prescription? Yes If no, delete pharmacy and type the correct one.  This is the patient's preferred pharmacy:  [facility] - [place], [place] - [address] [place] [address] Phone: [phone] Fax: [phone]   Has the prescription been filled recently? No  Is the patient out of the medication? No got a couple weeks   Has the patient been seen for an appointment in the last year OR does the patient have an upcoming appointment? Yes but the schedule template has not came out for July yet  Can we respond through MyChart? Yes  Agent: Please be advised that Rx refills may take up to 3 business days. We ask that you follow-up with your pharmacy.

## 2023-07-12 MED ORDER — TRELEGY ELLIPTA 100-62.5-25 MCG/ACT IN AEPB: 1.0000 | INHALATION_SPRAY | Freq: Every day | RESPIRATORY_TRACT | 0 refills | Status: DC

## 2023-08-19 ENCOUNTER — Other Ambulatory Visit: Payer: Self-pay | Admitting: Gastroenterology

## 2023-08-19 DIAGNOSIS — C210 Malignant neoplasm of anus, unspecified: Secondary | ICD-10-CM

## 2023-08-25 NOTE — Progress Notes (Addendum)
 [facility]  [phone]   HEMATOLOGY ONCOLOGY CONSULTATION   Misty [name]  DOB: 03-20-1956  MR#: [id number]  CSN#: [id number]    Patient Care Team: Sophronia Ozell BROCKS, MD as PCP - General (Family Medicine)  Reason for consult: new diagnosis of infiltrating squamous cell carcinoma, moderately differentiated.   History of present illness:   the patient underwent flexible sigmoidoscopy on 08/12/2023. DRE revealed 3 cm mass in anus. Mass was firm and fixed. Mass was found to be localized in the anterior bowel wall. Mass was non-obstructing and partially circumferential, capturing about 1/2 of the circumference of the anus. Biopsy of the mass, taken during sigmoidoscopy showed infiltrating squamous cell carcinoma, moderately differentiated. Immunostaining is strongly positive for AE1/AE3, CK 5/6, and p16. Stains for CK2G and CDX2 are negative. She is scheduled for pelvic MRI on 08/29/2023.  She continues to have rectal pain. Difficult to find a comfortable seated position. Has noted discharge from the rectum which is watery and brown, sometimes blood tinged. She continues to have constipation. She is taking stool softener along with milk of magnesia. She is using extra fiber. She states that when none of this was helping, she ended up using a friend's prescription lactulose which was helpful. She states that it is very painful to have bowel movement. She states that her appetite is normal. She is afraid to eat as she wants to limit the amount of times she has to have bowel movement, it is so painful. She states that over the past week or so, she has bladder spasms when she needs to urinate. They are present right at the start of urination, then resolve. She does report moderate fatigue, more so than usual. She has not had unintentional weight loss. She denies fever, but has had chills. She denies chest pain, chest pressure, or unusual shortness of breath. She denies  headaches or visual disturbances.  Socially, the patient is divorced. She lives alone. She has a very good friend, present in the office with her today, who lives nearby and willing to help however she can. She has an adult daughter, also with her today, who lives in [place], [place], and ca be with her mom in a few hours drive. The patient does have medical history of bipolar disorder. She is treated by psychiatry and feels as though her illness is currently well managed. She also has COPD, which is controlled with use of inhalers.   MEDICAL HISTORY:  Past Medical History:  Diagnosis Date   Anxiety    Bipolar disorder (HCC)    Dr. Vincente   COPD (chronic obstructive pulmonary disease) (HCC) 2006   Depression    Hypothyroidism    Rheumatic fever    as a child, age 68yo   Urinary tract infection    hx/o   Wears glasses     SURGICAL HISTORY: Past Surgical History:  Procedure Laterality Date   ABDOMINAL HYSTERECTOMY     APPENDECTOMY     CESAREAN SECTION     UTERINE FIBROID SURGERY      SOCIAL HISTORY: Social History   Socioeconomic History   Marital status: Divorced    Spouse name: Not on file   Number of children: Not on file   Years of education: Not on file   Highest education level: Not on file  Occupational History   Occupation: Disabled  Tobacco Use   Smoking status: Former    Current packs/day: 0.00    Average packs/day: 0.3  packs/day for 40.0 years (10.0 ttl pk-yrs)    Types: Cigarettes    Start date: 10/19/1975    Quit date: 10/19/2015    Years since quitting: 7.8   Smokeless tobacco: Never  Vaping Use   Vaping status: Former  Substance and Sexual Activity   Alcohol use: Yes   Drug use: No   Sexual activity: Not on file  Other Topics Concern   Not on file  Social History Narrative   Not on file   Social Drivers of Health   Financial Resource Strain: High Risk (08/17/2023)   Received from [facility]   Overall Financial Resource Strain (CARDIA)     Difficulty of Paying Living Expenses: Hard  Food Insecurity: Food Insecurity Present (08/17/2023)   Received from [facility]   Hunger Vital Sign    Worried About Running Out of Food in the Last Year: Sometimes true    Ran Out of Food in the Last Year: Never true  Transportation Needs: No Transportation Needs (08/17/2023)   Received from [facility] - Transportation    Lack of Transportation (Medical): No    Lack of Transportation (Non-Medical): No  Physical Activity: Insufficiently Active (08/17/2023)   Received from [facility]   Exercise Vital Sign    Days of Exercise per Week: 3 days    Minutes of Exercise per Session: 20 min  Stress: No Stress Concern Present (08/17/2023)   Received from [facility] - Occupational Stress Questionnaire    Feeling of Stress : Only a little  Social Connections: Moderately Integrated (08/17/2023)   Received from [facility]   Social Network    How would you rate your social network (family, work, friends)?: Adequate participation with social networks  Intimate Partner Violence: Not At Risk (08/17/2023)   Received from [facility]   HITS    Over the last 12 months how often did your partner physically hurt you?: Never    Over the last 12 months how often did your partner insult you or talk down to you?: Never    Over the last 12 months how often did your partner threaten you with physical harm?: Never    Over the last 12 months how often did your partner scream or curse at you?: Never    FAMILY HISTORY: Family History  Problem Relation Age of Onset   Allergies Sister    Asthma Sister    Heart disease Mother    Leukemia Mother     ALLERGIES:  is allergic to codeine.  MEDICATIONS:  Current Outpatient Medications  Medication Sig Dispense Refill   acetaminophen  (TYLENOL ) 500 MG tablet Take 1,000 mg by mouth every 4 (four) hours as needed for moderate pain.     albuterol  (VENTOLIN   HFA) 108 (90 Base) MCG/ACT inhaler Inhale 2 puffs into the lungs every 6 (six) hours as needed for wheezing or shortness of breath. 1 each 3   atorvastatin  (LIPITOR) 20 MG tablet Take 20 mg by mouth daily.   3   baclofen  (LIORESAL ) 10 MG tablet TAKE 1 TABLET THREE TIMES DAILY     buPROPion  (WELLBUTRIN  XL) 150 MG 24 hr tablet Take 300 mg by mouth every morning.      diazepam (VALIUM) 5 MG tablet Take 1 tablet by mouth in the morning and at bedtime.     fluticasone  (FLONASE) 50 MCG/ACT nasal spray Place 2 sprays into both nostrils.     Fluticasone -Umeclidin-Vilant (TRELEGY ELLIPTA )  100-62.5-25 MCG/ACT AEPB Inhale 1 puff into the lungs daily. 180 each 0   gabapentin  (NEURONTIN ) 600 MG tablet Take 300-900 mg by mouth 2 (two) times daily. Take 0.5 tablet (300 mg) in the morning and Take 1.5 (900 mg) in the evening     guaiFENesin  (MUCINEX ) 600 MG 12 hr tablet Take 600 mg by mouth 2 (two) times daily as needed for cough.     ipratropium-albuterol  (DUONEB) 0.5-2.5 (3) MG/3ML SOLN Take 3 mLs by nebulization every 4 (four) hours as needed. 360 mL 1   lamoTRIgine  (LAMICTAL ) 200 MG tablet Take 200 mg by mouth at bedtime.      levothyroxine  (SYNTHROID , LEVOTHROID) 75 MCG tablet Take 0.75 mcg by mouth daily.     Lidocaine -Hydrocortisone  Ace 2.8-0.55 % GEL Use 1 application externally QID prn 100 g 1   loratadine (CLARITIN) 10 MG tablet Take 10 mg by mouth daily.     montelukast  (SINGULAIR ) 10 MG tablet Take 1 tablet (10 mg total) by mouth at bedtime. 90 tablet 3   lurasidone (LATUDA) 20 MG TABS tablet Take 20 mg by mouth daily.     No current facility-administered medications for this visit.    REVIEW OF SYSTEMS:   Constitutional: Denies fevers. She has had nightly episodes of chills, but has not had unintentional weight loss.  Eyes: Denies blurriness of vision, double vision or watery eyes Ears, nose, mouth, throat, and face: Denies mucositis or sore throat Respiratory: Denies cough, dyspnea or  wheezes Cardiovascular: Denies palpitation, chest discomfort or lower extremity swelling Gastrointestinal:  Denies nausea, vomiting, or heartburn. She does not eat as often as she should as she does not want to have bowel movement. She is constipated and is trying to take OTC medications to alleviate constipation.  Skin: Denies abnormal skin rashes Lymphatics: Denies new lymphadenopathy or easy bruising Neurological:Denies numbness, tingling or new weaknesses Behavioral/Psych: Mood is stable, no new changes  All other systems were reviewed with the patient and are negative.  PHYSICAL EXAMINATION: ECOG PERFORMANCE STATUS: 1 - Symptomatic but completely ambulatory  Vitals:   08/26/23 1052  BP: 130/68  Pulse: (!) 108  Resp: 17  Temp: (!) 97.5 F (36.4 C)  SpO2: 96%   Filed Weights   08/26/23 1052  Weight: 154 lb (69.9 kg)    GENERAL:alert, no distress and comfortable SKIN: skin color, texture, turgor are normal, no rashes or significant lesions EYES: normal, conjunctiva are pink and non-injected, sclera clear OROPHARYNX:no exudate, no erythema and lips, buccal mucosa, and tongue normal  NECK: supple, thyroid normal size, non-tender, without nodularity LYMPH:  no palpable lymphadenopathy in the cervical, axillary or inguinal LUNGS: clear to auscultation and percussion with normal breathing effort HEART: regular rate & rhythm and no murmurs and no lower extremity edema ABDOMEN:abdomen soft, non-tender and normal bowel sounds Musculoskeletal:no cyanosis of digits and no clubbing  PSYCH: alert & oriented x 3 with fluent speech NEURO: no focal motor/sensory deficits RECTAL: there is firm, smooth lesion at the anus. There is approximately 3 cm of lesion visible outside of the anus. It is not obstructing.  It is extremely tender, making it difficult to complete rectal exam. No current evidence of bleeding or infection.   LABORATORY DATA:  I have reviewed the data as listed Lab Results   Component Value Date   WBC 18.6 (H) 08/26/2023   HGB 12.2 08/26/2023   HCT 37.3 08/26/2023   MCV 91.2 08/26/2023   PLT 377 08/26/2023   Recent Labs  08/26/23 1311  NA 136  K 3.9  CL 102  CO2 26  GLUCOSE 97  BUN 14  CREATININE 1.16*  CALCIUM  9.9  GFRNONAA 52*  PROT 8.2*  ALBUMIN 4.4  AST 13*  ALT 9  ALKPHOS 102  BILITOT 0.3     ASSESSMENT & PLAN:  Squamous cell carcinoma of anus (HCC) Assessment & Plan: Flexible sigmoidoscopy on 08/12/2023. DRE revealed 3 cm mass in anus. Mass was firm and fixed. Mass was found to be localized in the anterior bowel wall. Mass was non-obstructing and partially circumferential, capturing about 1/2 of the circumference of the anus. Biopsy of the mass, taken during sigmoidoscopy showed infiltrating squamous cell carcinoma, moderately differentiated. Immunostaining is strongly positive for AE1/AE3, CK 5/6, and p16. Stains for CK2G and CDX2 are negative. She is scheduled for pelvic MRI on 08/29/2023.  Lengthy discussion about concurrent chemoradiation. The course of therapy includes radiation to tumor, five days per week, for 5.5 weeks. She would have chemotherapy for week 1 and week 5 of treatment. She understands that on the Mondays of week 1 and week 4, she will have PICC line insertion. Chemotherapy will consist of mitomycin and 5-FU/leucovorin. She wears a pump, administering her the 5-FU for 5 days, removal on Friday, with PICC line removal also. Chemotherapy consent: Side effects including, but not limited to, fatigue, nausea, vomiting, diarrhea, hair loss, neuropathy, fluid retention, renal and kidney dysfunction, neutropenic fever, and bleeding, were discussed with patient in great detail.  She also understands that treatment with radiation may cause skin rash, breakdown, and blistering, as well as diarrhea, were discussed. Kala agrees to proceed with treatment.  Will see her back weekly while going through treatment. Plan to start treatment  09/08/2023 or 09/15/2023.  -PET scan ordered for staging  -labs ordered - CBC, iron studies, CMP, and CEA -social work and nutrition consults requested.  -refer to surgery  -refer to radiation oncology, Dr. Dewey  Orders: -     CBC with Differential (Cancer Center Only); Future -     CEA (Access); Future -     Iron and Iron Binding Capacity ([facility] only); Future -     CMP (Cancer Center only); Future -     Ferritin; Future -     NM PET Image Initial (PI) Skull Base To Thigh (F-18 FDG); Future -     Lidocaine -Hydrocortisone  Ace; Use 1 application externally QID prn  Dispense: 100 g; Refill: 1  Rectal pain Order for lidocaine /hydrocortisone  gel sent to her pharmacy. May apply 4 times daily as needed for rectal pain.   The patient was seen along with Dr. Lanny today. All questions were answered. The patient knows to call the clinic with any problems, questions or concerns. Time spent with the patient was approximately 40 minutes. This time included reviewing progress notes, labs, imaging studies, and discussing plan for follow up.        Powell FORBES Lessen, NP 08/26/2023 2:29 PM  Addendum I have seen the patient, examined her. I agree with the assessment and and plan and have edited the notes.   68 year old female was referred for newly diagnosed anal cancer.  She presented with rectal pain and underwent colonoscopy.  Mass is about 3 cm in the anus.  Biopsy confirmed squamous cell carcinoma.  I recommend staging scan with PET, and we will cancel her pelvic MRI which was ordered to [facility].  I discussed diagnosis, staging workup, prognosis and treatment options.  If no evidence of  distant metastasis, I recommended concurrent chemoradiation with 5-FU and mitomycin on week 1 and 5 of radiation.  Will make a urgent referral to radiation oncology.  Will review her case in GI board.  All questions were answered.  I spent a total of 60 minutes for her visit today.   Onita Mattock MD 08/26/2023

## 2023-08-26 ENCOUNTER — Inpatient Hospital Stay: Payer: Medicare (Managed Care)

## 2023-08-26 ENCOUNTER — Inpatient Hospital Stay: Payer: Medicare (Managed Care) | Attending: Nurse Practitioner | Admitting: Nurse Practitioner

## 2023-08-26 VITALS — BP 130/68 | HR 108 | Temp 97.5°F | Resp 17 | Wt 154.0 lb

## 2023-08-26 DIAGNOSIS — C210 Malignant neoplasm of anus, unspecified: Secondary | ICD-10-CM | POA: Insufficient documentation

## 2023-08-26 LAB — CBC WITH DIFFERENTIAL (CANCER CENTER ONLY)
Abs Immature Granulocytes: 0.31 10*3/uL — ABNORMAL HIGH (ref 0.00–0.07)
Basophils Absolute: 0.1 10*3/uL (ref 0.0–0.1)
Basophils Relative: 0 %
Eosinophils Absolute: 0.7 10*3/uL — ABNORMAL HIGH (ref 0.0–0.5)
Eosinophils Relative: 4 %
HCT: 37.3 % (ref 36.0–46.0)
Hemoglobin: 12.2 g/dL (ref 12.0–15.0)
Immature Granulocytes: 2 %
Lymphocytes Relative: 12 %
Lymphs Abs: 2.2 10*3/uL (ref 0.7–4.0)
MCH: 29.8 pg (ref 26.0–34.0)
MCHC: 32.7 g/dL (ref 30.0–36.0)
MCV: 91.2 fL (ref 80.0–100.0)
Monocytes Absolute: 1.3 10*3/uL — ABNORMAL HIGH (ref 0.1–1.0)
Monocytes Relative: 7 %
Neutro Abs: 14.1 10*3/uL — ABNORMAL HIGH (ref 1.7–7.7)
Neutrophils Relative %: 75 %
Platelet Count: 377 10*3/uL (ref 150–400)
RBC: 4.09 MIL/uL (ref 3.87–5.11)
RDW: 12.1 % (ref 11.5–15.5)
WBC Count: 18.6 10*3/uL — ABNORMAL HIGH (ref 4.0–10.5)
nRBC: 0 % (ref 0.0–0.2)

## 2023-08-26 LAB — CMP (CANCER CENTER ONLY)
ALT: 9 U/L (ref 0–44)
AST: 13 U/L — ABNORMAL LOW (ref 15–41)
Albumin: 4.4 g/dL (ref 3.5–5.0)
Alkaline Phosphatase: 102 U/L (ref 38–126)
Anion gap: 8 (ref 5–15)
BUN: 14 mg/dL (ref 8–23)
CO2: 26 mmol/L (ref 22–32)
Calcium: 9.9 mg/dL (ref 8.9–10.3)
Chloride: 102 mmol/L (ref 98–111)
Creatinine: 1.16 mg/dL — ABNORMAL HIGH (ref 0.44–1.00)
GFR, Estimated: 52 mL/min — ABNORMAL LOW (ref >60)
Glucose, Bld: 97 mg/dL (ref 70–99)
Potassium: 3.9 mmol/L (ref 3.5–5.1)
Sodium: 136 mmol/L (ref 135–145)
Total Bilirubin: 0.3 mg/dL (ref 0.0–1.2)
Total Protein: 8.2 g/dL — ABNORMAL HIGH (ref 6.5–8.1)

## 2023-08-26 LAB — IRON AND IRON BINDING CAPACITY (CC-WL,HP ONLY)
Iron: 28 ug/dL (ref 28–170)
Saturation Ratios: 11 % (ref 10.4–31.8)
TIBC: 265 ug/dL (ref 250–450)
UIBC: 237 ug/dL (ref 148–442)

## 2023-08-26 LAB — FERRITIN: Ferritin: 269 ng/mL (ref 11–307)

## 2023-08-26 LAB — CEA (ACCESS): CEA (CHCC): 2.72 ng/mL (ref 0.00–5.00)

## 2023-08-26 MED ORDER — LIDOCAINE-HYDROCORTISONE ACE 2.8-0.55 % RE GEL: RECTAL | 1 refills | Status: DC

## 2023-08-26 NOTE — Progress Notes (Signed)
 PATIENT NAVIGATOR PROGRESS NOTE  Name: Misty [name] Date: 08/26/2023 MRN: [id number]  DOB: Oct 01, 1955   Reason for visit:  New Patient Appt  Comments: Patient was seen in office today during her initial appointment with Rande Bushy, NP and Dr. Sonja .  Patient's daughter and friend were also present during her appt. Patient was given Journey's binder with information specific to her diagnosis. Patient was also given my direct contact information and was instructed to contact office with any questions or concerns.   Per Dr. Maryalice Smaller, MR was cancelled as we will be getting a PET scan instead.    Will follow-up with patient at her next appointment.   Time spent counseling/coordinating care: > 60 minutes

## 2023-08-26 NOTE — Assessment & Plan Note (Signed)
 Flexible sigmoidoscopy on 08/12/2023. DRE revealed 3 cm mass in anus. Mass was firm and fixed. Mass was found to be localized in the anterior bowel wall. Mass was non-obstructing and partially circumferential, capturing about 1/2 of the circumference of the anus. Biopsy of the mass, taken during sigmoidoscopy showed infiltrating squamous cell carcinoma, moderately differentiated. Immunostaining is strongly positive for AE1/AE3, CK 5/6, and p16. Stains for CK2G and CDX2 are negative. She is scheduled for pelvic MRI on 08/29/2023.  Lengthy discussion about concurrent chemoradiation. The course of therapy includes radiation to tumor, five days per week, for 5.5 weeks. She would have chemotherapy for week 1 and week 5 of treatment. She understands that on the Mondays of week 1 and week 4, she will have PICC line insertion. Chemotherapy will consist of mitomycin and 5-FU/leucovorin. She wears a pump, administering her the 5-FU for 5 days, removal on Friday, with PICC line removal also. Chemotherapy consent: Side effects including, but not limited to, fatigue, nausea, vomiting, diarrhea, hair loss, neuropathy, fluid retention, renal and kidney dysfunction, neutropenic fever, need for blood transfusion, and bleeding, were discussed with patient in great detail.  She also understands that treatment with radiation may cause skin rash, breakdown, and blistering, as well as diarrhea, were discussed. Verma agrees to proceed with treatment.  Will see her back weekly while going through treatment. Plan to start treatment 09/08/2023 or 09/15/2023.  -PET scan ordered for staging  -labs ordered - CBC, iron studies, CMP, and CEA -social work and nutrition consults requested.  -refer to surgery  -refer to radiation oncology, Dr. Jeryl Moris

## 2023-08-27 ENCOUNTER — Telehealth: Payer: Self-pay | Admitting: Hematology

## 2023-08-27 ENCOUNTER — Encounter: Payer: Self-pay | Admitting: General Practice

## 2023-08-27 NOTE — Progress Notes (Signed)
 [facility] Spiritual Care Note  Received new-patient referral from Nursing for additional layer of support. Reached Ms Ayotte by phone to introduce Spiritual Care as part of her support team.   She reports strong support from her close-knit [employer]hov[employer]'s Witness faith community and also appreciates learning about support from [facility], including support programming, which she hopes to explore when she is feeling well enough.  Ms Carlini has direct Spiritual Care number and plans to reach out as needed/desired for follow-up support.   [address] [place], [place], [facility] Pager [phone] Voicemail [phone]

## 2023-08-29 ENCOUNTER — Inpatient Hospital Stay: Admission: RE | Admit: 2023-08-29 | Payer: Medicare (Managed Care) | Source: Ambulatory Visit

## 2023-08-29 ENCOUNTER — Encounter: Payer: Self-pay | Admitting: Hematology

## 2023-08-29 NOTE — Progress Notes (Signed)
START ON PATHWAY REGIMEN - Anal Carcinoma     One cycle, concurrent with RT:     Fluorouracil      Mitomycin   **Always confirm dose/schedule in your pharmacy ordering system**  Patient Characteristics: Anal Canal Tumors, Newly Diagnosed - Locoregional Disease (Clinical Staging) Therapeutic Status: Newly Diagnosed - Locoregional Disease (Clinical Staging) AJCC T Category: cT2 AJCC N Category: cN0 AJCC M Category: cM0 AJCC 9 Stage Grouping: IIA Check here if patient was staged using an edition other than AJCC Staging 9th Edition: false Intent of Therapy: Curative Intent, Discussed with Patient

## 2023-08-29 NOTE — Progress Notes (Signed)
 Patient has called with questions regarding treatment schedule and needed appts.  Informed patient she will be getting phone calls from Radiation Oncology, Physical Therapy, and Surgical Oncology to schedule appts prior to her starting treatment.  Right now her anticipated chemo/radiation start date is 6/23, pending her Radiation consult.  Patient verbalized understanding.  Patient was instructed to contact office with any questions or concerns.  Will follow-up with patient next week.

## 2023-08-29 NOTE — Addendum Note (Signed)
 Addended by: Sonja Finley on: 08/29/2023 09:42 AM   Modules accepted: Orders

## 2023-08-31 ENCOUNTER — Other Ambulatory Visit: Payer: Self-pay

## 2023-09-01 NOTE — Progress Notes (Signed)
 Patient called office with questions related to her upcoming chemo treatments. 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 Patient stated she has a HC POA paperwork at home and wanted to know if she should bring it in so our has a copy. Confirmed with patient that she should bring in a copy to her next appt and that we could have it scanned into her chart.  Also informed patient that I would notate in her chart that she is a Jehovah's Witness and declines blood.  Patient verbalized understanding and agreed to contact office if she had any additional questions or concerns.

## 2023-09-02 ENCOUNTER — Other Ambulatory Visit: Payer: Self-pay

## 2023-09-02 ENCOUNTER — Ambulatory Visit: Payer: Medicare (Managed Care) | Attending: Hematology | Admitting: Physical Therapy

## 2023-09-02 DIAGNOSIS — R293 Abnormal posture: Secondary | ICD-10-CM | POA: Insufficient documentation

## 2023-09-02 DIAGNOSIS — C210 Malignant neoplasm of anus, unspecified: Secondary | ICD-10-CM | POA: Insufficient documentation

## 2023-09-02 DIAGNOSIS — M6281 Muscle weakness (generalized): Secondary | ICD-10-CM | POA: Diagnosis present

## 2023-09-02 DIAGNOSIS — R279 Unspecified lack of coordination: Secondary | ICD-10-CM | POA: Insufficient documentation

## 2023-09-02 NOTE — Patient Instructions (Signed)
 Misty [name]

## 2023-09-02 NOTE — Therapy (Unsigned)
 OUTPATIENT PHYSICAL THERAPY FEMALE PELVIC EVALUATION   Patient Name: Misty [name] MRN: [id number] DOB:November 10, 1955, 68 y.o., female Today's Date: 09/03/2023  END OF SESSION:  PT End of Session - 09/02/23 1226     Visit Number 1    Date for PT Re-Evaluation 12/03/23    Authorization Type [employer] MED[employer]ARE [employer]VANTAGE    Progress Note Due on Visit 10    PT Start Time 1230    PT Stop Time 1310    PT Time Calculation (min) 40 min    Activity Tolerance Patient tolerated treatment well    Behavior During Therapy WFL for tasks assessed/performed             Past Medical History:  Diagnosis Date   Anxiety    Bipolar disorder (HCC)    Dr. 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   Hypothyroidism 10/09/2019   HLD (hyperlipidemia) 10/09/2019   Bipolar 1 disorder (HCC) 10/09/2019   COPD exacerbation (HCC) 10/08/2019   COPD GOLD II 07/17/2013   Smoker 07/17/2013    PCP: Emaline Handsome, MD   REFERRING PROVIDER: Sonja [place], MD  REFERRING DIAG: C21.0 (ICD-10-CM) - Squamous cell carcinoma of anus (HCC)  THERAPY DIAG:  Muscle weakness (generalized)  Abnormal posture  Unspecified lack of coordination  Squamous cell carcinoma of anus (HCC)  Rationale for Evaluation and Treatment: Rehabilitation  ONSET DATE: 1 year  SUBJECTIVE:                                                                                                                                                                                           SUBJECTIVE STATEMENT: Went to the doctor 01/2023 with  c/o constipation with bleeding with bowel movement. Suppositories were given and did not help and in 04/2023 went back and felt like she had a hemorrhoids but requested female doctor and found to have CA.      Fluid intake:   PAIN:  Are you having pain? Yes NPRS scale: 2-8/10 Pain location: rectum  Pain type: burning and pressure, stinging  Pain description: constant   Aggravating factors: bowel movement  or sitting  Relieving factors: cushion for sitting, lidocaine  cream  PRECAUTIONS:active cancer treatments  RED FLAGS: Bowel or bladder incontinence: Yes: stool with cancer   WEIGHT BEARING RESTRICTIONS: No  FALLS:  Has patient fallen in last 6 months? No   OCCUPATION: retired  ACTIVITY LEVEL : moderate - working out 3 times weekly (liked biking, weight lifting with machines)  PLOF: Independent  PATIENT GOALS: to have less pain and improved bowel habits  PERTINENT HISTORY:  bipolar disorder, anxiety, depression, UTI, abdominal hysterectomy, c-section, uterine fibroid Sexual abuse: No  BOWEL MOVEMENT: Pain with bowel movement: Yes Type of bowel movement:Type (Bristol Stool Scale) 6-7, Frequency several up to 10x, and Strain sometimes Fully empty rectum: No Leakage: Yes: daily Pads: Yes: 6x Fiber supplement/laxative Yes   URINATION: Pain with urination: No Fully empty bladder: Yes:   Stream: Strong Urgency: No Frequency: no  Leakage: none Pads: No  INTERCOURSE:  Ability to have vaginal penetration No  Not active  PREGNANCY: Vaginal deliveries 1  C-section deliveries 1 Currently pregnant No  PROLAPSE: Pressure at rectum   OBJECTIVE:  Note: Objective measures were completed at Evaluation unless otherwise noted.  DIAGNOSTIC FINDINGS:    PATIENT SURVEYS:  PFIQ-7 (76)    COGNITION: Overall cognitive status: Within functional limits for tasks assessed     SENSATION: Light touch: Appears intact   FUNCTIONAL TESTS:  41' with no AD   TWO MINUTE WALK TEST *individual walks without assistance for 2 minutes and the distance is measured *start timing when the individual is instructed to "Go" *stop timing at 2 minutes *assistive devices can be used but should be kept consistent and  documented from test to test  *if physical assistance is required to walk, this should not be performed * a measuring wheel is helpful to determine distance walked *should be performed at the fastest speed possible   Gender Age in years (# in study) Mean distance in meters Female  18-54 (260)   200.9 55-59 (23)   191.0 60-64 (29)   179.1 65-69 (22)   184.2 70-74 (32)   172.2 75-79 (19)   157.6 80-85 (32)   144.1  Female 18-54 (539)   183.0 55-59 (30)   176.4 60-64 (48)   166.4 65-69 (22)   155.2 70-74 (33)   145.9 75-79 (14)   140.9 80-85 (34)   134.3   GAIT: Assistive device utilized: None Comments: decreased cadence, forward flexed posture, and decreased bil stride length and step height  POSTURE: rounded shoulders   LUMBARAROM/PROM:  A/PROM A/PROM  eval  Flexion WFL  Extension WFL  Right lateral flexion Limited by 25%  Left lateral flexion Limited by 25%  Right rotation Limited by 25%  Left rotation Limited by 25%   (Blank rows = not tested)  LOWER EXTREMITY ROM:  WFL  LOWER EXTREMITY MMT: Bil hips grossly 4+/5 PALPATION:   General: tightness in bil lumbar paraspinals  Pelvic Alignment: WFL  Abdominal: mild fascial restrictions in abdomen in lower quadrants                 External Perineal Exam: deferred - pt reports she is uncomfortable and would like to wait until after treatment complete                             Internal Pelvic Floor: deferred  Patient confirms identification and approves PT to assess internal pelvic floor and treatment No  PELVIC MMT:   MMT eval  Vaginal   Internal Anal Sphincter   External Anal Sphincter   Puborectalis   Diastasis Recti   (Blank rows = not  tested)        TONE: Deferred   PROLAPSE: deferred  TODAY'S TREATMENT:                                                                                                                              DATE:   09/02/23 EVAL Examination completed, findings reviewed, pt educated on POC, HEP, and voiding mechanics, abdominal massage, female pelvic floor anatomy, relaxation techniques and possible need of dilators vaginally for improved pelvic floor mobility at least 4 weeks after radiation treatment is complete. Pt motivated to participate in PT and agreeable to attempt recommendations.     PATIENT EDUCATION:  Education details: [id number] HEP, and voiding mechanics, abdominal massage, female pelvic floor anatomy, relaxation techniques Person educated: Patient Education method: Explanation, Demonstration, Tactile cues, Verbal cues, and Handouts Education comprehension: verbalized understanding, returned demonstration, verbal cues required, tactile cues required, and needs further education  HOME EXERCISE PROGRAM: [id number]   ASSESSMENT:  CLINICAL IMPRESSION: Patient is a 68 y.o. female  who was seen today for physical therapy evaluation and treatment for anal cancer. Pt is here for baseline eval, was very active prior to cancer diagnosis but due to worsening pain as been very limited in mobility. Pt does endorse bowel changes however needs to be on laxatives to keep her stools loose otherwise unable to pass due to tumor location. Pt found to have below age related norm of however reports she is walking much slower than her normal right now because her tumor is so distal in anal canal is rubs when she walks quickly and causes her more discomfort. Pt did have mild hip and core weakness but states not below her baseline currently, does need a cushion to sit due to pain. Pt deferred internal assessment at this time due to discomfort and would like to wait until after treatment. Pt educated on  importance of remaining active daily, energy conservation, HEP, and voiding mechanics, abdominal massage, female pelvic floor anatomy, relaxation techniques. Pt given all handouts on this and PT's contact information for any questions. Pt would like to return at 4 weeks post treatment ending to have reassessment and possibly additional treatments if there is a decline. Pt understands she can call clinic if she feels she has a physical decline during treatment for PT sessions. Pt would benefit from additional PT to further address deficits.    OBJECTIVE IMPAIRMENTS: decreased activity tolerance, decreased endurance, decreased mobility, decreased strength, increased fascial restrictions, increased muscle spasms, improper body mechanics, postural dysfunction, and pain.   ACTIVITY LIMITATIONS: continence and locomotion level  PARTICIPATION LIMITATIONS: cleaning, shopping, and community activity  PERSONAL FACTORS: 1 comorbidity: medical history are also affecting patient's functional outcome.   REHAB POTENTIAL: Good  CLINICAL DECISION MAKING: Stable/uncomplicated  EVALUATION  COMPLEXITY: Low   GOALS: Goals reviewed with patient? Yes  SHORT TERM GOALS: Target date: 10/02/23  Pt to be I with HEP for carry over and continuing recommendations for improved outcomes.   Baseline: Goal status: INITIAL  2.  Pt will be independent with use of squatty potty, relaxed toileting mechanics, and improved bowel movement techniques in order to increase ease of bowel movements and complete evacuation.   Baseline:  Goal status: INITIAL  3.  Pt to report ability to complete at least 30 mins (in total) of brisk walking daily for improved mobility and cardiac endurance during her cancer treatments.  Baseline:  Goal status: INITIAL    LONG TERM GOALS: Target date: 12/03/23  Pt to be I with advanced  HEP for carry over and continuing recommendations for improved outcomes.   Baseline:  Goal status:  INITIAL  2.  Pt will report no more than 3 BMs per day due to improved muscle tone and coordination with bowel movements with minimal to no incontinence for pts ability to return to community outings without soiling pants.  Baseline:  Goal status: INITIAL  3.  Pt to tolerate walking 1 hour without incontinence for improved ability to return to pt's PLOF with working out daily.  Baseline:  Goal status: INITIAL  4.  Pt to demonstrate ability to complete 15# squat without leakage or increased pain due to improved pelvic stability and core/hip strength for pt be able to carry groceries inside without leakage.  Baseline:  Goal status: INITIAL  5.  Pt to demonstrate ability to complete 1 min dynamic standing on complaint surface to demonstrate low fall risk as pt lives alone and wants to return to high level activities with working out.  Baseline:  Goal status: INITIAL   PLAN:  PT FREQUENCY: 2x/week if needed during or after cancer treatments, if not during cancer treatments may return to PT at 4 weeks after cancer treatment  PT DURATION: 10 sessions  PLANNED INTERVENTIONS: 97110-Therapeutic exercises, 97530- Therapeutic activity, 97112- Neuromuscular re-education, 97535- Self Care, 91478- Manual therapy, [phone]- Gait training, [phone]- Canalith repositioning, [id number]- Aquatic Therapy, [phone]- Electrical stimulation (manual), [id number]- Vasopneumatic device, [id number]- Ionotophoresis 4mg /ml Dexamethasone, 20560 (1-2 muscles), 20561 (3+ muscles)- Dry Needling, Patient/Family education, Balance training, Stair training, Taping, Joint mobilization, Spinal mobilization, Manual lymph drainage, Scar mobilization, Compression bandaging, Vestibular training, DME instructions, Cryotherapy, Moist heat, and Biofeedback  PLAN FOR NEXT SESSION: reassess post treatment plan; if sooner - core and hip strengthening, stretching low back, hips and pelvic floor; educate on vaginal dilators   Avie Lemme, PT,  DPT 06/11/258:17 AM

## 2023-09-03 ENCOUNTER — Other Ambulatory Visit: Payer: Self-pay

## 2023-09-04 ENCOUNTER — Other Ambulatory Visit: Payer: Self-pay | Admitting: Nurse Practitioner

## 2023-09-04 ENCOUNTER — Ambulatory Visit: Payer: Medicare HMO | Admitting: Obstetrics and Gynecology

## 2023-09-04 MED ORDER — PROCHLORPERAZINE MALEATE 10 MG PO TABS: 10.0000 mg | ORAL_TABLET | Freq: Four times a day (QID) | ORAL | 1 refills | Status: DC | PRN

## 2023-09-04 MED ORDER — ONDANSETRON HCL 8 MG PO TABS: 8.0000 mg | ORAL_TABLET | Freq: Three times a day (TID) | ORAL | 1 refills | Status: DC | PRN

## 2023-09-04 NOTE — Progress Notes (Signed)
 Sent antiemetics to [facility] on [address] -compazine 10 mg QID prn  -zofran  8 mg TID prn - not to use for 72 hours following chemotherapy   -Camryn Lampson, NP

## 2023-09-05 ENCOUNTER — Inpatient Hospital Stay: Payer: Medicare (Managed Care)

## 2023-09-05 ENCOUNTER — Encounter (HOSPITAL_COMMUNITY)
Admission: RE | Admit: 2023-09-05 | Discharge: 2023-09-05 | Disposition: A | Discharge status: Home / Self Care | Payer: Medicare (Managed Care) | Source: Ambulatory Visit | Attending: Nurse Practitioner

## 2023-09-05 DIAGNOSIS — C210 Malignant neoplasm of anus, unspecified: Secondary | ICD-10-CM | POA: Insufficient documentation

## 2023-09-05 DIAGNOSIS — R590 Localized enlarged lymph nodes: Secondary | ICD-10-CM | POA: Insufficient documentation

## 2023-09-05 DIAGNOSIS — I700 Atherosclerosis of aorta: Secondary | ICD-10-CM | POA: Insufficient documentation

## 2023-09-05 LAB — GLUCOSE, CAPILLARY: Glucose-Capillary: 112 mg/dL — ABNORMAL HIGH (ref 70–99)

## 2023-09-05 MED ORDER — FLUDEOXYGLUCOSE F - 18 (FDG) INJECTION
7.6400 | Freq: Once | INTRAVENOUS | Status: AC
  Administered 2023-09-05: 7.64 via INTRAVENOUS

## 2023-09-05 NOTE — Progress Notes (Signed)
 GI Location of Tumor / Histology: Anal Cancer  Patient is Jehovas Witness and declines all blood products.  Misty [name] presented with rectal pain and bleeding.  She was seen by the gastroenterology team and underwent flexible sigmoidoscopy 07/2023.   PET 09/05/2023: Intense hypermetabolic activity extending from the anus into the rectum, spanning 7.4 cm in length and demonstrating an SUV max of 15.5. Small inguinal lymph nodes with low level hypermetabolic activity, largest on the left measuring 6 mm short axis. No hypermetabolic nodal activity elsewhere in the abdomen or pelvis.   Sigmoidoscopy 08/12/2023:   Biopsies of Anal Mass 08/12/2023    Past/Anticipated interventions by surgeon, if any:  Dr. [name] 09/01/2023 -68 year old female who presents to the office for evaluation of a newly diagnosed anal squamous cell carcinoma.  -This appears to be approximately 4 cm in diameter and extending externally in the left and right anterior perianal area.  -She is scheduled to start chemo RT on the 23rd. I will see her back in the office approximately 1 month after completing radiation to begin her post treatment surveillance.    Past/Anticipated interventions by medical oncology, if any:  Powell Lessen NP / Dr. Lanny 08/26/2023 -PET scan ordered for staging  -social work and nutrition consults requested.  -refer to surgery  -refer to radiation oncology, Dr. Dewey -I recommended concurrent chemoradiation with 5-FU and mitomycin on week 1 and 5 of radiation.     Weight changes, if any: She reports minimal weight changes.  Bowel/Bladder complaints, if any: Constipation has resolved with the use of stool softeners.  She reports bladder spasms when she needs to urinate.  They happen right at the start of urination, then resolve.  She reports the bladder spasms have resolved completely.  Nausea / Vomiting, if any: No  Pain issues, if any: She reports constant pain in her bottom.  5/10  pain today.  Pain with bowel movements.  Any blood per rectum: She reports water brown discharge per rectum that is on occasionally blood tinged.     SAFETY ISSUES: Prior radiation? No Pacemaker/ICD? No Possible current pregnancy? Hysterectomy Is the patient on methotrexate? No  Current Complaints/Details: Bipoloar disorder treated by psychiatry.

## 2023-09-08 ENCOUNTER — Inpatient Hospital Stay: Payer: Medicare (Managed Care) | Admitting: Licensed Clinical Social Worker

## 2023-09-08 ENCOUNTER — Encounter: Payer: Self-pay | Admitting: Hematology

## 2023-09-08 DIAGNOSIS — C210 Malignant neoplasm of anus, unspecified: Secondary | ICD-10-CM

## 2023-09-08 NOTE — Progress Notes (Signed)
 Received referral from Amalia Badder in social work for patient whom I had requested to receive my card on chemo education appointment.  Called patient to introduce myself as Dance movement psychotherapist and to offer available resources. Discussed one-time $1000 [employer] grant to assist with personal expenses while going through treatment and advised what is needed to apply. She will provide to registration tomorrow on 09/09/23 to be scanned and emailed to me. She will also be given paperwork to complete and be scanned and emailed to me and review her portion and contact me at her earliest convenience.  She has my card to do so and for any additional financial questions or concerns.

## 2023-09-08 NOTE — Progress Notes (Signed)
 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   HISTORY OF PRESENT ILLNESS: Misty [name] is a 68 y.o. female seen at the request of Dr. Maryalice Smaller for new diagnosis of anal cancer.  The patient recently noticed symptoms of rectal bleeding, and rectal pain.  She was referred to GI and underwent a flexible sigmoidoscopy on 08/12/23 that showed a 3 cm anterior wall anal mass.  Biopsy showed infiltrating squamous cell carcinoma and she was also referred to Dr. Andy Bannister in colorectal surgery.  A PET scan on 09/05/2023 showed intense hypermetabolic activity extending from the anus into the rectum spanning 7.4 cm of length with an SUV of 15.5, small inguinal lymph nodes with low-level metabolic activity were noted the largest being on the left measuring 6 mm with an SUV of 4.2.  No metastatic disease was appreciated.  She met with Dr. Maryalice Smaller about chemoradiation and is seen to establish herself in our clinic.     PREVIOUS RADIATION THERAPY: No   PAST MEDICAL HISTORY:  Past Medical History:  Diagnosis Date   Anxiety    Bipolar disorder (HCC)    Dr. Deborra Falter   COPD (chronic obstructive pulmonary disease) (HCC) 2006   Depression    Hypothyroidism    Rheumatic fever    as a child, age 11yo   Urinary tract infection    hx/o   Wears glasses        PAST SURGICAL HISTORY: Past Surgical History:  Procedure Laterality Date   ABDOMINAL HYSTERECTOMY     APPENDECTOMY     CESAREAN SECTION     UTERINE FIBROID SURGERY       FAMILY HISTORY:  Family History  Problem Relation Age of Onset   Allergies Sister    Asthma Sister    Heart disease Mother    Leukemia Mother      SOCIAL HISTORY:  reports that she quit  smoking about 7 years ago. Her smoking use included cigarettes. She started smoking about 47 years ago. She has a 10 pack-year smoking history. She has never used smokeless tobacco. She reports current alcohol use. She reports that she does not use drugs. The patient is single and lives in [place]. She is accompanid by her daughter and a friend. She's been on disability since 2010 due to her bipolar disorder.    ALLERGIES: Codeine   MEDICATIONS:  Current Outpatient Medications  Medication Sig Dispense Refill   acetaminophen  (TYLENOL ) 500 MG tablet Take 1,000 mg by mouth every 4 (four) hours as needed for moderate pain.     albuterol  (VENTOLIN  HFA) 108 (90 Base) MCG/ACT inhaler Inhale 2 puffs into the lungs every 6 (six) hours as needed for wheezing or shortness of breath. 1 each 3   ALBUTEROL  IN Albuterol  inhaler     atorvastatin  (LIPITOR) 20 MG tablet Take 20 mg by mouth daily.   3   baclofen  (LIORESAL ) 10 MG tablet TAKE 1 TABLET THREE TIMES DAILY     buPROPion  (WELLBUTRIN  XL) 150 MG 24 hr tablet Take 300 mg by mouth every morning.      diazepam (VALIUM) 5 MG tablet Take 1 tablet by mouth in the morning and  at bedtime.     Docusate Sodium 100 MG capsule Take 100 mg by mouth.     fluticasone  (FLONASE) 50 MCG/ACT nasal spray Place 2 sprays into both nostrils.     Fluticasone -Umeclidin-Vilant (TRELEGY ELLIPTA ) 100-62.5-25 MCG/ACT AEPB Inhale 1 puff into the lungs daily. 180 each 0   gabapentin  (NEURONTIN ) 600 MG tablet Take 300-900 mg by mouth 2 (two) times daily. Take 0.5 tablet (300 mg) in the morning and Take 1.5 (900 mg) in the evening     guaiFENesin  (MUCINEX ) 600 MG 12 hr tablet Take 600 mg by mouth 2 (two) times daily as needed for cough.     ipratropium-albuterol  (DUONEB) 0.5-2.5 (3) MG/3ML SOLN Take 3 mLs by nebulization every 4 (four) hours as needed. 360 mL 1   lamoTRIgine  (LAMICTAL ) 200 MG tablet Take 200 mg by mouth at bedtime.      levothyroxine  (SYNTHROID , LEVOTHROID) 75 MCG  tablet Take 0.75 mcg by mouth daily.     Lidocaine -Hydrocortisone  Ace 2.8-0.55 % GEL Use 1 application externally QID prn 100 g 1   loratadine (CLARITIN) 10 MG tablet Take 10 mg by mouth daily.     montelukast  (SINGULAIR ) 10 MG tablet Take 1 tablet (10 mg total) by mouth at bedtime. 90 tablet 3   Multiple Vitamins-Minerals (CENTRUM ADULT PO) Take by mouth.     naltrexone (DEPADE) 50 MG tablet Take 50 mg by mouth.     ondansetron  (ZOFRAN ) 8 MG tablet Take 1 tablet (8 mg total) by mouth every 8 (eight) hours as needed for nausea or vomiting. Do not use for 72 hours following chemotherapy. 30 tablet 1   polyethylene glycol (MIRALAX) 17 g packet MiraLax     prochlorperazine  (COMPAZINE ) 10 MG tablet Take 1 tablet (10 mg total) by mouth every 6 (six) hours as needed for nausea or vomiting. 30 tablet 1   Specialty Vitamins Products (HAIR NOURISHING SUPPLEMENT PO) Nutrafol hair supplement.  4 tabs daily.     valACYclovir (VALTREX) 500 MG tablet Take 500 mg by mouth daily.     zolpidem  (AMBIEN ) 10 MG tablet Take 10 mg by mouth daily.     No current facility-administered medications for this encounter.     REVIEW OF SYSTEMS: On review of systems, the patient reports that she is doing okay. She reports pain in her anal region especially with sitting or after a bowel movement when she's up and walking. She describes a change in the caliber of her stools now being more narrow. She is using miralax and stool softeners to go more easily. No other complaints are verbalized.        PHYSICAL EXAM:  Wt Readings from Last 3 Encounters:  09/09/23 153 lb (69.4 kg)  08/26/23 154 lb (69.9 kg)  04/02/22 170 lb 9.6 oz (77.4 kg)   Temp Readings from Last 3 Encounters:  09/09/23 (!) 97.3 F (36.3 C) (Temporal)  08/26/23 (!) 97.5 F (36.4 C)  10/04/21 98 F (36.7 C) (Oral)   BP Readings from Last 3 Encounters:  09/09/23 122/81  08/26/23 130/68  10/10/22 114/74   Pulse Readings from Last 3 Encounters:   09/09/23 92  08/26/23 (!) 108  10/10/22 75   Pain Assessment Pain Score: 5  Pain Loc: Rectum/10  In general this is a well appearing caucasian female in no acute distress. She's alert and oriented x4 and appropriate throughout the examination. Cardiopulmonary assessment is negative for acute distress and she exhibits normal effort.     ECOG = 1  0 - Asymptomatic (Fully active, able to carry on all predisease activities without restriction)  1 - Symptomatic but completely ambulatory (Restricted in physically strenuous activity but ambulatory and able to carry out work of a light or sedentary nature. For example, light housework, office work)  2 - Symptomatic, <50% in bed during the day (Ambulatory and capable of all self care but unable to carry out any work activities. Up and about more than 50% of waking hours)  3 - Symptomatic, >50% in bed, but not bedbound (Capable of only limited self-care, confined to bed or chair 50% or more of waking hours)  4 - Bedbound (Completely disabled. Cannot carry on any self-care. Totally confined to bed or chair)  5 - Death   Aurea Blossom MM, Creech RH, Tormey DC, et al. [phone]). Toxicity and response criteria of the [facility]. Am. Hillard Lowes. Oncol. 5 (6): 649-55    LABORATORY DATA:  Lab Results  Component Value Date   WBC 18.6 (H) 08/26/2023   HGB 12.2 08/26/2023   HCT 37.3 08/26/2023   MCV 91.2 08/26/2023   PLT 377 08/26/2023   Lab Results  Component Value Date   NA 136 08/26/2023   K 3.9 08/26/2023   CL 102 08/26/2023   CO2 26 08/26/2023   Lab Results  Component Value Date   ALT 9 08/26/2023   AST 13 (L) 08/26/2023   ALKPHOS 102 08/26/2023   BILITOT 0.3 08/26/2023      RADIOGRAPHY: NM PET Image Initial (PI) Skull Base To Thigh Result Date: 09/05/2023 CLINICAL DATA:  Initial treatment strategy for anal cancer. EXAM: NUCLEAR MEDICINE PET SKULL BASE TO THIGH TECHNIQUE: 7.64 mCi F-18 FDG was injected  intravenously. Full-ring PET imaging was performed from the skull base to thigh after the radiotracer. CT data was obtained and used for attenuation correction and anatomic localization. Fasting blood glucose: 112 mg/dl COMPARISON:  None Available. FINDINGS: Mediastinal blood pool activity: SUV max 2.2 NECK: No hypermetabolic cervical lymph nodes are identified.Fairly symmetric activity within the lymphoid tissue of Waldeyer's ring is within physiologic limits. No suspicious activity identified within the pharyngeal mucosal space. Incidental CT findings: Mild carotid atherosclerosis. CHEST: There are no hypermetabolic mediastinal, hilar or axillary lymph nodes. No hypermetabolic pulmonary activity or suspicious nodularity. Incidental CT findings: Atherosclerosis of the aorta, great vessels and coronary arteries. ABDOMEN/PELVIS: Intense hypermetabolic activity extending from the anus into the rectum, spanning 7.4 cm in length and demonstrating an SUV max of 15.5. Small inguinal lymph nodes with low level hypermetabolic activity, largest on the left measuring 6 mm short axis on image 180/4 (SUV max 4.2). No hypermetabolic nodal activity elsewhere in the abdomen or pelvis. There is no hypermetabolic activity within the liver, adrenal glands, spleen or pancreas. No other suspicious bowel activity. Incidental CT findings: Retrocecal surgical clips. Aortoiliac atherosclerosis. Previous hysterectomy. SKELETON: There is no hypermetabolic activity to suggest osseous metastatic disease. Prominent asymmetric muscular activity surrounding the right shoulder without CT correlate, likely physiologic. 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 IMPRESSION: 1. Intense hypermetabolic activity extending from the anus into the rectum, consistent with known primary malignancy. 2. Small bilateral inguinal lymph nodes with low level hypermetabolic activity, indeterminate for metastatic disease. 3. No  evidence of distant metastatic disease. 4.  Aortic Atherosclerosis (ICD10-I70.0). Electronically Signed   By: Elmon Hagedorn M.D.   On: 09/05/2023 14:23       IMPRESSION/PLAN: 1. Stage IIIC, cT3N1M0, Squamous Cell Carcinoma of the anus.  Dr. Jeryl Moris discusses the pathology findings and reviews the nature of anal carcinoma and the recommendations for a course of chemoradiation.  We discussed the risks, benefits, short, and long term effects of radiotherapy, as well as the curative intent, and the patient is interested in proceeding. Dr. Jeryl Moris discusses the delivery and logistics of radiotherapy and anticipates a course of 6 weeks of radiotherapy to the anus and regional nodes of the pelvis. Written consent is obtained and placed in the chart, a copy was provided to the patient. She will simulate today and we anticipate starting treatment on 09/15/23.  2 Risks of pelvic floor dysfunction from radiotherapy. We discussed the importance of evaluation with physical therapy prior to pelvic radiation. A referral was placed to physical therapy today. She was also given a kit of vaginal dilators for use with PT following radiotherapy.   In a visit lasting 60 minutes, greater than 50% of the time was spent face to face discussing the patient's condition, in preparation for the discussion, and coordinating the patient's care.   The above documentation reflects my direct findings during this shared patient visit. Please see the separate note by Dr. Jeryl Moris on this date for the remainder of the patient's plan of care.    Shelvia Dick, [facility]   **Disclaimer: This note was dictated with voice recognition software. Similar sounding words can inadvertently be transcribed and this note may contain transcription errors which may not have been corrected upon publication of note.**

## 2023-09-08 NOTE — Progress Notes (Signed)
 Pharmacist Chemotherapy Monitoring - Initial Assessment    Anticipated start date: 09/15/23   The following has been reviewed per standard work regarding the patient's treatment regimen: The patient's diagnosis, treatment plan and drug doses, and organ/hematologic function Lab orders and baseline tests specific to treatment regimen  The treatment plan start date, drug sequencing, and pre-medications Prior authorization status  Patient's documented medication list, including drug-drug interaction screen and prescriptions for anti-emetics and supportive care specific to the treatment regimen The drug concentrations, fluid compatibility, administration routes, and timing of the medications to be used The patient's access for treatment and lifetime cumulative dose history, if applicable  The patient's medication allergies and previous infusion related reactions, if applicable   Changes made to treatment plan:  N/A  Follow up needed:  N/A   Misty [name], Pharm.D., CPP 09/08/2023@8 :24 AM

## 2023-09-09 ENCOUNTER — Encounter: Payer: Self-pay | Admitting: Radiation Oncology

## 2023-09-09 ENCOUNTER — Encounter: Payer: Self-pay | Admitting: Hematology

## 2023-09-09 ENCOUNTER — Ambulatory Visit
Admission: RE | Admit: 2023-09-09 | Discharge: 2023-09-09 | Disposition: A | Discharge status: Home / Self Care | Payer: Medicare (Managed Care) | Source: Ambulatory Visit | Attending: Radiation Oncology | Admitting: Radiation Oncology

## 2023-09-09 ENCOUNTER — Telehealth: Payer: Self-pay

## 2023-09-09 VITALS — BP 122/81 | HR 92 | Temp 97.3°F | Resp 18 | Ht 63.0 in | Wt 153.0 lb

## 2023-09-09 DIAGNOSIS — F319 Bipolar disorder, unspecified: Secondary | ICD-10-CM | POA: Insufficient documentation

## 2023-09-09 DIAGNOSIS — I700 Atherosclerosis of aorta: Secondary | ICD-10-CM | POA: Diagnosis not present

## 2023-09-09 DIAGNOSIS — Z87891 Personal history of nicotine dependence: Secondary | ICD-10-CM | POA: Diagnosis not present

## 2023-09-09 DIAGNOSIS — I2510 Atherosclerotic heart disease of native coronary artery without angina pectoris: Secondary | ICD-10-CM | POA: Diagnosis not present

## 2023-09-09 DIAGNOSIS — C210 Malignant neoplasm of anus, unspecified: Secondary | ICD-10-CM

## 2023-09-09 DIAGNOSIS — Z79624 Long term (current) use of inhibitors of nucleotide synthesis: Secondary | ICD-10-CM | POA: Diagnosis not present

## 2023-09-09 DIAGNOSIS — I708 Atherosclerosis of other arteries: Secondary | ICD-10-CM | POA: Insufficient documentation

## 2023-09-09 DIAGNOSIS — I6529 Occlusion and stenosis of unspecified carotid artery: Secondary | ICD-10-CM | POA: Insufficient documentation

## 2023-09-09 DIAGNOSIS — Z79899 Other long term (current) drug therapy: Secondary | ICD-10-CM | POA: Insufficient documentation

## 2023-09-09 DIAGNOSIS — J449 Chronic obstructive pulmonary disease, unspecified: Secondary | ICD-10-CM | POA: Diagnosis not present

## 2023-09-09 DIAGNOSIS — Z87440 Personal history of urinary (tract) infections: Secondary | ICD-10-CM | POA: Insufficient documentation

## 2023-09-09 DIAGNOSIS — Z806 Family history of leukemia: Secondary | ICD-10-CM | POA: Diagnosis not present

## 2023-09-09 DIAGNOSIS — Z79890 Hormone replacement therapy (postmenopausal): Secondary | ICD-10-CM | POA: Insufficient documentation

## 2023-09-09 DIAGNOSIS — E039 Hypothyroidism, unspecified: Secondary | ICD-10-CM | POA: Insufficient documentation

## 2023-09-09 DIAGNOSIS — Z510 Encounter for antineoplastic radiation therapy: Secondary | ICD-10-CM | POA: Insufficient documentation

## 2023-09-09 NOTE — Telephone Encounter (Signed)
 Received message from Audrea Learned, [place] for Dr. Jeryl Moris regarding pt's lab appts.  Pt is requesting to have her lab/PICC Dressing change appts scheduled on a different day from her chemo tx.  LVM for pt stating that the lab appts will need to remain as scheduled d/t the labs are needed for pt's chemo txs.  Instructed pt to contact Dr. Candise Chambers office is further discussion is needed.

## 2023-09-09 NOTE — Progress Notes (Signed)
 [facility] Clinical Social Work  Initial Assessment   Misty [name] is a 68 y.o. year old female contacted by phone. Clinical Social Work was referred by medical provider for assessment of psychosocial needs.   SDOH (Social Determinants of Health) assessments performed: Yes   SDOH Screenings   Food Insecurity: Food Insecurity Present (08/17/2023)   Received from [facility]  Housing: Unknown (09/01/2023)   Received from [facility]  Transportation Needs: No Transportation Needs (08/17/2023)   Received from [facility]  Utilities: Not At Risk (08/17/2023)   Received from [facility]  Financial Resource Strain: High Risk (08/17/2023)   Received from [facility]  Physical Activity: Insufficiently Active (08/17/2023)   Received from [facility]  Social Connections: Moderately Integrated (08/17/2023)   Received from [facility]  Stress: No Stress Concern Present (08/17/2023)   Received from [facility]  Tobacco Use: Medium Risk (09/01/2023)   Received from [facility]     Distress Screen completed: Yes    09/05/2023    5:30 PM  ONCBCN DISTRESS SCREENING  Screening Type Initial Screening  How much distress have you been experiencing in the past week? (0-10) 7  Practical concerns type Finances;Treatment decisions  Social concerns type Communication with health care team  Emotional concerns type Worry or anxiety  Physical Concerns Type  Pain;Fatigue      Family/Social Information:  Housing Arrangement: patient lives alone in an independent living retirement community. Family members/support persons in your life? Pt has a daughter who resides locally as well as another daughter residing in [place].  Pt reports her local daughter is not able to provide support, but her daughter in [place] is assisting as she is able.  Pt also reports having friends locally who will be available to assist as needed.   Transportation concerns: no   Employment: Retired   Income source: Actor concerns: Yes, current concerns Type of concern: Medical bills Food access concerns: no Religious or spiritual practice: Yes-Jehovah's Witness Advanced directives: Pt reports having completed advanced directive paperwork at home.  CSW requested pt bring documents in to scan into chart to which she verbalized agreement.   Services Currently in place:  none  Coping/ Adjustment to diagnosis: Patient understands treatment plan and what happens next? yes Concerns about diagnosis and/or treatment: Overwhelmed by information, Afraid of cancer, and How I will pay for the services I need Patient reported stressors: Finances, Anxiety/ nervousness, and Adjusting to my illness Hopes and/or priorities: Pt's priority is to start treatment w/ the hope of positive results. Patient enjoys time with family/ friends Current coping skills/ strengths: Capable of independent living , Motivation for treatment/growth , Physical Health , and Supportive family/friends     SUMMARY: Current SDOH Barriers:  Financial constraints related to fixed income.  Clinical Social Work Clinical Goal(s):  Explore community resource options for unmet needs related to:  Financial Strain   Interventions: Discussed common feeling and emotions when being diagnosed with cancer, and the importance of support during treatment Informed patient of the support team roles and support services at [facility] Provided CSW contact information and encouraged patient to call with any questions or concerns Provided patient with information about the [employer] as well as contact information for patient Dance movement psychotherapist.  Pt states she does receive food stamps and is aware of documents required to apply for the grant.  CSW emailed pt a link to [employer] to explore if grants may be  available to assist w/ medical bills and send a referral to Cancer Services for  additional financial support.  Pt experiencing a great deal of anxiety following diagnosis scoring a 7 on distress screen.  Pt is connected to a psychiatrist, but is not currently engaged in counseling.  CSW informed of availability for counseling.  CSW discussed pt's current level of anxiety, providing space to process and providing emotional support.  CSW will meet w/ pt in infusion during treatment to check in as pt goes through treatment.     Follow Up Plan: CSW will see pt in infusion and continue to provide ongoing emotional support.   Patient verbalizes understanding of plan: Yes    Quenton Bruns, LCSW Clinical Social Worker [facility]

## 2023-09-10 ENCOUNTER — Other Ambulatory Visit: Payer: Self-pay

## 2023-09-12 ENCOUNTER — Encounter: Payer: Self-pay | Admitting: Hematology

## 2023-09-12 DIAGNOSIS — Z510 Encounter for antineoplastic radiation therapy: Secondary | ICD-10-CM | POA: Diagnosis not present

## 2023-09-12 NOTE — Progress Notes (Signed)
 The proposed treatment discussed in conference is for discussion purpose only and is not a binding recommendation.  The patients have not been physically examined, or presented with their treatment options.  Therefore, final treatment plans cannot be decided.

## 2023-09-15 ENCOUNTER — Encounter: Payer: Self-pay | Admitting: Radiation Oncology

## 2023-09-15 ENCOUNTER — Other Ambulatory Visit: Payer: Self-pay

## 2023-09-15 ENCOUNTER — Inpatient Hospital Stay (HOSPITAL_BASED_OUTPATIENT_CLINIC_OR_DEPARTMENT_OTHER): Payer: Medicare (Managed Care) | Admitting: Hematology

## 2023-09-15 ENCOUNTER — Encounter: Payer: Self-pay | Admitting: Hematology

## 2023-09-15 ENCOUNTER — Other Ambulatory Visit: Payer: Self-pay | Admitting: Hematology

## 2023-09-15 ENCOUNTER — Ambulatory Visit
Admission: RE | Admit: 2023-09-15 | Discharge: 2023-09-15 | Disposition: A | Discharge status: Home / Self Care | Payer: Medicare (Managed Care) | Source: Ambulatory Visit | Attending: Radiation Oncology | Admitting: Radiation Oncology

## 2023-09-15 ENCOUNTER — Ambulatory Visit (HOSPITAL_COMMUNITY)
Admission: RE | Admit: 2023-09-15 | Discharge: 2023-09-15 | Disposition: A | Discharge status: Home / Self Care | Payer: Medicare (Managed Care) | Source: Ambulatory Visit | Attending: Hematology | Admitting: Hematology

## 2023-09-15 ENCOUNTER — Inpatient Hospital Stay: Payer: Medicare (Managed Care)

## 2023-09-15 VITALS — BP 118/58 | HR 74 | Temp 98.1°F | Resp 16 | Ht 63.0 in | Wt 153.0 lb

## 2023-09-15 DIAGNOSIS — C210 Malignant neoplasm of anus, unspecified: Secondary | ICD-10-CM

## 2023-09-15 DIAGNOSIS — Z95828 Presence of other vascular implants and grafts: Secondary | ICD-10-CM | POA: Insufficient documentation

## 2023-09-15 DIAGNOSIS — Z510 Encounter for antineoplastic radiation therapy: Secondary | ICD-10-CM | POA: Diagnosis not present

## 2023-09-15 LAB — CMP (CANCER CENTER ONLY)
ALT: 17 U/L (ref 0–44)
AST: 18 U/L (ref 15–41)
Albumin: 4.3 g/dL (ref 3.5–5.0)
Alkaline Phosphatase: 85 U/L (ref 38–126)
Anion gap: 7 (ref 5–15)
BUN: 19 mg/dL (ref 8–23)
CO2: 25 mmol/L (ref 22–32)
Calcium: 10 mg/dL (ref 8.9–10.3)
Chloride: 107 mmol/L (ref 98–111)
Creatinine: 0.97 mg/dL (ref 0.44–1.00)
GFR, Estimated: >60 mL/min (ref >60)
Glucose, Bld: 93 mg/dL (ref 70–99)
Potassium: 4.4 mmol/L (ref 3.5–5.1)
Sodium: 139 mmol/L (ref 135–145)
Total Bilirubin: 0.4 mg/dL (ref 0.0–1.2)
Total Protein: 7.5 g/dL (ref 6.5–8.1)

## 2023-09-15 LAB — RAD ONC ARIA SESSION SUMMARY
Course Elapsed Days: 0
Plan Fractions Treated to Date: 1
Plan Prescribed Dose Per Fraction: 1.8 Gy
Plan Total Fractions Prescribed: 30
Plan Total Prescribed Dose: 54 Gy
Reference Point Dosage Given to Date: 1.8 Gy
Reference Point Session Dosage Given: 1.8 Gy
Session Number: 1

## 2023-09-15 LAB — CBC WITH DIFFERENTIAL (CANCER CENTER ONLY)
Abs Immature Granulocytes: 0.04 10*3/uL (ref 0.00–0.07)
Basophils Absolute: 0.1 10*3/uL (ref 0.0–0.1)
Basophils Relative: 1 %
Eosinophils Absolute: 0.2 10*3/uL (ref 0.0–0.5)
Eosinophils Relative: 3 %
HCT: 36.1 % (ref 36.0–46.0)
Hemoglobin: 11.7 g/dL — ABNORMAL LOW (ref 12.0–15.0)
Immature Granulocytes: 1 %
Lymphocytes Relative: 27 %
Lymphs Abs: 2 10*3/uL (ref 0.7–4.0)
MCH: 30 pg (ref 26.0–34.0)
MCHC: 32.4 g/dL (ref 30.0–36.0)
MCV: 92.6 fL (ref 80.0–100.0)
Monocytes Absolute: 0.7 10*3/uL (ref 0.1–1.0)
Monocytes Relative: 9 %
Neutro Abs: 4.5 10*3/uL (ref 1.7–7.7)
Neutrophils Relative %: 59 %
Platelet Count: 309 10*3/uL (ref 150–400)
RBC: 3.9 MIL/uL (ref 3.87–5.11)
RDW: 13.9 % (ref 11.5–15.5)
WBC Count: 7.6 10*3/uL (ref 4.0–10.5)
nRBC: 0 % (ref 0.0–0.2)

## 2023-09-15 MED ORDER — SODIUM CHLORIDE 0.9 % IV SOLN
1000.0000 mg/m2/d | INTRAVENOUS | Status: DC
  Administered 2023-09-15: 7000 mg via INTRAVENOUS
  Filled 2023-09-15: qty 140

## 2023-09-15 MED ORDER — HEPARIN SOD (PORK) LOCK FLUSH 100 UNIT/ML IV SOLN
250.0000 [IU] | Freq: Once | INTRAVENOUS | Status: AC
  Administered 2023-09-15: 250 [IU]

## 2023-09-15 MED ORDER — LIDOCAINE HCL 1 % IJ SOLN
INTRAMUSCULAR | Status: AC
  Filled 2023-09-15: qty 20

## 2023-09-15 MED ORDER — LIDOCAINE HCL 1 % IJ SOLN
20.0000 mL | Freq: Once | INTRAMUSCULAR | Status: AC
  Administered 2023-09-15: 20 mL via INTRADERMAL

## 2023-09-15 MED ORDER — HEPARIN SOD (PORK) LOCK FLUSH 100 UNIT/ML IV SOLN
INTRAVENOUS | Status: AC
Start: 2023-09-15 — End: 2023-09-15
  Filled 2023-09-15: qty 5

## 2023-09-15 MED ORDER — HEPARIN SOD (PORK) LOCK FLUSH 100 UNIT/ML IV SOLN: 500.0000 [IU] | Freq: Once | INTRAVENOUS | Status: AC

## 2023-09-15 MED ORDER — SODIUM CHLORIDE 0.9 % IV SOLN
INTRAVENOUS | Status: DC
Start: 2023-09-15 — End: 2023-09-15

## 2023-09-15 MED ORDER — SODIUM CHLORIDE 0.9% FLUSH
10.0000 mL | Freq: Once | INTRAVENOUS | Status: AC
  Administered 2023-09-15: 10 mL

## 2023-09-15 MED ORDER — PROCHLORPERAZINE MALEATE 10 MG PO TABS
10.0000 mg | ORAL_TABLET | Freq: Once | ORAL | Status: AC
  Administered 2023-09-15: 10 mg via ORAL
  Filled 2023-09-15: qty 1

## 2023-09-15 MED ORDER — MITOMYCIN CHEMO IV INJECTION 20 MG
10.0000 mg/m2 | Freq: Once | INTRAVENOUS | Status: AC
  Administered 2023-09-15: 18 mg via INTRAVENOUS
  Filled 2023-09-15: qty 36

## 2023-09-15 NOTE — Procedures (Signed)
 Right double-lumen basilic vein PICC placed.  Length 40 cm.  Tip SVC/right atrial junction.  Medication used- 1 % lidocaine  to skin/ subcutaneous tissue. EBL <  2cc. OK to use.

## 2023-09-15 NOTE — Progress Notes (Signed)
 Radiation Oncology         [phone] ________________________________  Name: Misty [name]        MRN: [id number]  Date of Service: 09/09/2023 DOB: March 16, 1956  RR:Azjf, [name], MD  [name], MD     REFERRING PHYSICIAN: [name], MD   DIAGNOSIS: The encounter diagnosis was Squamous cell carcinoma of anus (HCC).   HISTORY OF PRESENT ILLNESS: Misty [name] is a 68 y.o. female seen at the request of Dr. Lanny for new diagnosis of anal cancer.  The patient recently noticed symptoms of rectal bleeding, and rectal pain.  She was referred to GI and underwent a flexible sigmoidoscopy on 08/12/23 that showed a 3 cm anterior wall anal mass.  Biopsy showed infiltrating squamous cell carcinoma and she was also referred to Dr. [name] in colorectal surgery.  A PET scan on 09/05/2023 showed intense hypermetabolic activity extending from the anus into the rectum spanning 7.4 cm of length with an SUV of 15.5, small inguinal lymph nodes with low-level metabolic activity were noted the largest being on the left measuring 6 mm with an SUV of 4.2.  No metastatic disease was appreciated.  She met with Dr. Lanny about chemoradiation and is seen to establish herself in our clinic.     PREVIOUS RADIATION THERAPY: No   PAST MEDICAL HISTORY:  Past Medical History:  Diagnosis Date   Anxiety    Bipolar disorder (HCC)    Dr. Vincente   COPD (chronic obstructive pulmonary disease) (HCC) 2006   Depression    Hypothyroidism    Rheumatic fever    as a child, age 39yo   Urinary tract infection    hx/o   Wears glasses        PAST SURGICAL HISTORY: Past Surgical History:  Procedure Laterality Date   ABDOMINAL HYSTERECTOMY     APPENDECTOMY     CESAREAN SECTION     UTERINE FIBROID SURGERY       FAMILY HISTORY:  Family History  Problem Relation Age of Onset   Allergies Sister    Asthma Sister    Heart disease Mother    Leukemia Mother      SOCIAL HISTORY:  reports that she quit  smoking about 7 years ago. Her smoking use included cigarettes. She started smoking about 47 years ago. She has a 10 pack-year smoking history. She has never used smokeless tobacco. She reports current alcohol use. She reports that she does not use drugs. The patient is single and lives in [place]. She is accompanid by her daughter and a friend. She's been on disability since 2010 due to her bipolar disorder.    ALLERGIES: Codeine   MEDICATIONS:  Current Outpatient Medications  Medication Sig Dispense Refill   acetaminophen  (TYLENOL ) 500 MG tablet Take 1,000 mg by mouth every 4 (four) hours as needed for moderate pain.     albuterol  (VENTOLIN  HFA) 108 (90 Base) MCG/ACT inhaler Inhale 2 puffs into the lungs every 6 (six) hours as needed for wheezing or shortness of breath. 1 each 3   ALBUTEROL  IN Albuterol  inhaler     atorvastatin  (LIPITOR) 20 MG tablet Take 20 mg by mouth daily.   3   baclofen  (LIORESAL ) 10 MG tablet TAKE 1 TABLET THREE TIMES DAILY     buPROPion  (WELLBUTRIN  XL) 150 MG 24 hr tablet Take 300 mg by mouth every morning.      diazepam (VALIUM) 5 MG tablet Take 1 tablet by mouth in the morning and  at bedtime.     Docusate Sodium 100 MG capsule Take 100 mg by mouth.     fluticasone  (FLONASE) 50 MCG/ACT nasal spray Place 2 sprays into both nostrils.     Fluticasone -Umeclidin-Vilant (TRELEGY ELLIPTA ) 100-62.5-25 MCG/ACT AEPB Inhale 1 puff into the lungs daily. 180 each 0   gabapentin  (NEURONTIN ) 600 MG tablet Take 300-900 mg by mouth 2 (two) times daily. Take 0.5 tablet (300 mg) in the morning and Take 1.5 (900 mg) in the evening     guaiFENesin  (MUCINEX ) 600 MG 12 hr tablet Take 600 mg by mouth 2 (two) times daily as needed for cough.     ipratropium-albuterol  (DUONEB) 0.5-2.5 (3) MG/3ML SOLN Take 3 mLs by nebulization every 4 (four) hours as needed. 360 mL 1   lamoTRIgine  (LAMICTAL ) 200 MG tablet Take 200 mg by mouth at bedtime.      levothyroxine  (SYNTHROID , LEVOTHROID) 75 MCG  tablet Take 0.75 mcg by mouth daily.     Lidocaine -Hydrocortisone  Ace 2.8-0.55 % GEL Use 1 application externally QID prn 100 g 1   loratadine (CLARITIN) 10 MG tablet Take 10 mg by mouth daily.     montelukast  (SINGULAIR ) 10 MG tablet Take 1 tablet (10 mg total) by mouth at bedtime. 90 tablet 3   Multiple Vitamins-Minerals (CENTRUM ADULT PO) Take by mouth.     naltrexone (DEPADE) 50 MG tablet Take 50 mg by mouth.     ondansetron  (ZOFRAN ) 8 MG tablet Take 1 tablet (8 mg total) by mouth every 8 (eight) hours as needed for nausea or vomiting. Do not use for 72 hours following chemotherapy. 30 tablet 1   polyethylene glycol (MIRALAX) 17 g packet MiraLax     prochlorperazine  (COMPAZINE ) 10 MG tablet Take 1 tablet (10 mg total) by mouth every 6 (six) hours as needed for nausea or vomiting. 30 tablet 1   Specialty Vitamins Products (HAIR NOURISHING SUPPLEMENT PO) Nutrafol hair supplement.  4 tabs daily.     valACYclovir (VALTREX) 500 MG tablet Take 500 mg by mouth daily.     zolpidem  (AMBIEN ) 10 MG tablet Take 10 mg by mouth daily.     No current facility-administered medications for this encounter.     REVIEW OF SYSTEMS: On review of systems, the patient reports that she is doing okay. She reports pain in her anal region especially with sitting or after a bowel movement when she's up and walking. She describes a change in the caliber of her stools now being more narrow. She is using miralax and stool softeners to go more easily. No other complaints are verbalized.        PHYSICAL EXAM:  Wt Readings from Last 3 Encounters:  09/09/23 153 lb (69.4 kg)  08/26/23 154 lb (69.9 kg)  04/02/22 170 lb 9.6 oz (77.4 kg)   Temp Readings from Last 3 Encounters:  09/09/23 (!) 97.3 F (36.3 C) (Temporal)  08/26/23 (!) 97.5 F (36.4 C)  10/04/21 98 F (36.7 C) (Oral)   BP Readings from Last 3 Encounters:  09/09/23 122/81  08/26/23 130/68  10/10/22 114/74   Pulse Readings from Last 3 Encounters:   09/09/23 92  08/26/23 (!) 108  10/10/22 75   Pain Assessment Pain Score: 5  Pain Loc: Rectum/10  In general this is a well appearing caucasian female in no acute distress. She's alert and oriented x4 and appropriate throughout the examination. Cardiopulmonary assessment is negative for acute distress and she exhibits normal effort.     ECOG = 1  0 - Asymptomatic (Fully active, able to carry on all predisease activities without restriction)  1 - Symptomatic but completely ambulatory (Restricted in physically strenuous activity but ambulatory and able to carry out work of a light or sedentary nature. For example, light housework, office work)  2 - Symptomatic, <50% in bed during the day (Ambulatory and capable of all self care but unable to carry out any work activities. Up and about more than 50% of waking hours)  3 - Symptomatic, >50% in bed, but not bedbound (Capable of only limited self-care, confined to bed or chair 50% or more of waking hours)  4 - Bedbound (Completely disabled. Cannot carry on any self-care. Totally confined to bed or chair)  5 - Death   Raylene MM, Creech RH, Tormey DC, et al. [phone]). Toxicity and response criteria of the [facility]. Am. DOROTHA Bridges. Oncol. 5 (6): 649-55    LABORATORY DATA:  Lab Results  Component Value Date   WBC 18.6 (H) 08/26/2023   HGB 12.2 08/26/2023   HCT 37.3 08/26/2023   MCV 91.2 08/26/2023   PLT 377 08/26/2023   Lab Results  Component Value Date   NA 136 08/26/2023   K 3.9 08/26/2023   CL 102 08/26/2023   CO2 26 08/26/2023   Lab Results  Component Value Date   ALT 9 08/26/2023   AST 13 (L) 08/26/2023   ALKPHOS 102 08/26/2023   BILITOT 0.3 08/26/2023      RADIOGRAPHY: NM PET Image Initial (PI) Skull Base To Thigh Result Date: 09/05/2023 CLINICAL DATA:  Initial treatment strategy for anal cancer. EXAM: NUCLEAR MEDICINE PET SKULL BASE TO THIGH TECHNIQUE: 7.64 mCi F-18 FDG was injected  intravenously. Full-ring PET imaging was performed from the skull base to thigh after the radiotracer. CT data was obtained and used for attenuation correction and anatomic localization. Fasting blood glucose: 112 mg/dl COMPARISON:  None Available. FINDINGS: Mediastinal blood pool activity: SUV max 2.2 NECK: No hypermetabolic cervical lymph nodes are identified.Fairly symmetric activity within the lymphoid tissue of Waldeyer's ring is within physiologic limits. No suspicious activity identified within the pharyngeal mucosal space. Incidental CT findings: Mild carotid atherosclerosis. CHEST: There are no hypermetabolic mediastinal, hilar or axillary lymph nodes. No hypermetabolic pulmonary activity or suspicious nodularity. Incidental CT findings: Atherosclerosis of the aorta, great vessels and coronary arteries. ABDOMEN/PELVIS: Intense hypermetabolic activity extending from the anus into the rectum, spanning 7.4 cm in length and demonstrating an SUV max of 15.5. Small inguinal lymph nodes with low level hypermetabolic activity, largest on the left measuring 6 mm short axis on image 180/4 (SUV max 4.2). No hypermetabolic nodal activity elsewhere in the abdomen or pelvis. There is no hypermetabolic activity within the liver, adrenal glands, spleen or pancreas. No other suspicious bowel activity. Incidental CT findings: Retrocecal surgical clips. Aortoiliac atherosclerosis. Previous hysterectomy. SKELETON: There is no hypermetabolic activity to suggest osseous metastatic disease. Prominent asymmetric muscular activity surrounding the right shoulder without CT correlate, likely physiologic. 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 IMPRESSION: 1. Intense hypermetabolic activity extending from the anus into the rectum, consistent with known primary malignancy. 2. Small bilateral inguinal lymph nodes with low level hypermetabolic activity, indeterminate for metastatic disease. 3. No  evidence of distant metastatic disease. 4.  Aortic Atherosclerosis (ICD10-I70.0). Electronically Signed   By: Elsie Perone M.D.   On: 09/05/2023 14:23       IMPRESSION/PLAN: 1. Stage IIIA, cT3N1M0, Squamous Cell Carcinoma of the anus.  Dr. Dewey discusses the pathology findings and reviews the nature of anal carcinoma and the recommendations for a course of chemoradiation.  We discussed the risks, benefits, short, and long term effects of radiotherapy, as well as the curative intent, and the patient is interested in proceeding. Dr. Dewey discusses the delivery and logistics of radiotherapy and anticipates a course of 6 weeks of radiotherapy to the anus and regional nodes of the pelvis. Written consent is obtained and placed in the chart, a copy was provided to the patient. She will simulate today and we anticipate starting treatment on 09/15/23.  2 Risks of pelvic floor dysfunction from radiotherapy. We discussed the importance of evaluation with physical therapy prior to pelvic radiation. A referral was placed to physical therapy today. She was also given a kit of vaginal dilators for use with PT following radiotherapy.   In a visit lasting 60 minutes, greater than 50% of the time was spent face to face discussing the patient's condition, in preparation for the discussion, and coordinating the patient's care.   The above documentation reflects my direct findings during this shared patient visit. Please see the separate note by Dr. Dewey on this date for the remainder of the patient's plan of care.    Donald KYM Husband, [facility]   **Disclaimer: This note was dictated with voice recognition software. Similar sounding words can inadvertently be transcribed and this note may contain transcription errors which may not have been corrected upon publication of note.**

## 2023-09-15 NOTE — Addendum Note (Signed)
 Encounter addended by: Lanell Donald Stagger, PA-C on: 09/15/2023 9:51 AM  Actions taken: Clinical Note Signed, Delete clinical note

## 2023-09-15 NOTE — Patient Instructions (Signed)
 [facility] - A DEPT OF [place]  Discharge Instructions: Thank you for choosing [facility] to provide your oncology and hematology care.   If you have a lab appointment with the Cancer Center, please go directly to the Cancer Center and check in at the registration area.   Wear comfortable clothing and clothing appropriate for easy access to any Portacath or PICC line.   We strive to give you quality time with your provider. You may need to reschedule your appointment if you arrive late (15 or more minutes).  Arriving late affects you and other patients whose appointments are after yours.  Also, if you miss three or more appointments without notifying the office, you may be dismissed from the clinic at the provider's discretion.      For prescription refill requests, have your pharmacy contact our office and allow 72 hours for refills to be completed.    Today you received the following chemotherapy and/or immunotherapy agents mitomycin and adrucil       To help prevent nausea and vomiting after your treatment, we encourage you to take your nausea medication as directed.  BELOW ARE SYMPTOMS THAT SHOULD BE REPORTED IMMEDIATELY: *FEVER GREATER THAN 100.4 F (38 C) OR HIGHER *CHILLS OR SWEATING *NAUSEA AND VOMITING THAT IS NOT CONTROLLED WITH YOUR NAUSEA MEDICATION *UNUSUAL SHORTNESS OF BREATH *UNUSUAL BRUISING OR BLEEDING *URINARY PROBLEMS (pain or burning when urinating, or frequent urination) *BOWEL PROBLEMS (unusual diarrhea, constipation, pain near the anus) TENDERNESS IN MOUTH AND THROAT WITH OR WITHOUT PRESENCE OF ULCERS (sore throat, sores in mouth, or a toothache) UNUSUAL RASH, SWELLING OR PAIN  UNUSUAL VAGINAL DISCHARGE OR ITCHING   Items with * indicate a potential emergency and should be followed up as soon as possible or go to the Emergency Department if any problems should occur.  Please show the CHEMOTHERAPY ALERT CARD or  IMMUNOTHERAPY ALERT CARD at check-in to the Emergency Department and triage nurse.  Should you have questions after your visit or need to cancel or reschedule your appointment, please contact [facility] - A DEPT OF [facility]  Dept: [phone]  and follow the prompts.  Office hours are 8:00 a.m. to 4:30 p.m. Monday - Friday. Please note that voicemails left after 4:00 p.m. may not be returned until the following business day.  We are closed weekends and major holidays. You have access to a nurse at all times for urgent questions. Please call the main number to the clinic Dept: [phone] and follow the prompts.   For any non-urgent questions, you may also contact your provider using MyChart. We now offer e-Visits for anyone 67 and older to request care online for non-urgent symptoms. For details visit [email or web address].   Also download the MyChart app! Go to the app store, search MyChart, open the app, select [place], and log in with your MyChart username and password.

## 2023-09-15 NOTE — Progress Notes (Signed)
 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  Biopsy confirmed squamous cell carcinoma. - Staging PET scan showed intense of hypermetabolic activity extending from the anus into the rectum, and the small bilateral inguinal lymph nodes with low-level activity.  No evidence of distant metastasis. -She will start concurrent chemoradiation with mitomycin and 5-FU on September 15, 2023.  Assessment & Plan Anal cancer, stage IIIC Stage IIIC anal cancer with no distant metastasis and possible groin lymph node involvement. Tumor measures approximately 3 cm in the anal canal. Treatment plan includes chemoradiation with curative intent, though advanced stage reduces likelihood of complete remission. As a Jehov[employer]'s Witness, she declines blood transfusions, necessitating alternative anemia management. Treatment goal is cure, Weekly lab monitoring will manage potential anemia, with growth factor injections considered if necessary. Monitoring for side effects such as rectal discomfort, bowel movement frequency, and skin irritation is essential. - Administer chemoradiation for six weeks, with radiation Monday through Friday and chemotherapy in weeks one and five. - Monitor blood counts weekly to assess for anemia and other side effects. - Use growth factor injections if anemia develops, as blood transfusions are not an option. - Schedule a PET scan three months post-treatment to evaluate response. - Coordinate  with colorectal surgeon for follow-up endoscopy post-treatment. - Educate on potential side effects including rectal discomfort, bowel movement frequency, and skin irritation. - Advise on the use of biotin mouthwash or baking soda for mouth sores. - Instruct to report any signs of infection, dehydration, or severe side effects immediately. - Encourage adequate hydration and nutrition to prevent dehydration. - Advise on the use of a bidet or sitz baths for hygiene and comfort. - Schedule weekly follow-up appointments during treatment.  Anemia, unspecified Mild anemia with hemoglobin at 11.7 g/dL. Anemia management is complicated by refusal of blood transfusions due to religious beliefs. Weekly lab monitoring will manage potential anemia, with growth factor injections considered if necessary. - Monitor hemoglobin levels weekly during chemoradiation. - Consider growth factor injections if anemia worsens.  Chronic obstructive pulmonary disease (COPD) COPD managed with inhalers.  Bipolar disorder Bipolar disorder managed with gabapentin  and lamotrigine .  Plan - Lab and medication list are reviewed, will proceed for cycle mitomycin and 5-FU today - Continue radiation daily, 5 days a week - Lab and follow-up weekly with us     SUMMARY OF ONCOLOGIC HISTORY: Oncology History  Squamous cell carcinoma of anus (HCC)  08/26/2023 Initial Diagnosis   Squamous cell carcinoma of anus (HCC)   09/09/2023 Cancer Staging   Staging form: Anus, AJCC 8th Edition - Clinical: Stage IIIC (cT3, cN1a, cM0) - Signed by [name], MD on 09/09/2023   09/15/2023 -  Chemotherapy   Patient is on Treatment Plan : ANUS Mitomycin D1,28 + 5FU D1-4, 28-31 q32d        Discussed the use of AI scribe software for clinical note transcription with the patient, who gave verbal consent to proceed.  History of Present Illness Misty [name] is a 68 year old  female with anal cancer who presents for follow-up and to  start treatment.  A PICC line was recently placed after two attempts. Bowel movements are improving but remain irregular at two to three times daily. She takes Miralax daily, which is helpful. A recent PET scan showed no distant metastasis but identified lymph nodes in the groin area, possibly related to anal cancer.  Current medications include zolpidem  for sleep, baclofen , Wellbutrin , Valium for anxiety, gabapentin , Lamictal  for bipolar disorder, and inhalers for COPD. She has nausea medication at home for use as needed.  As a Jehovah's Witness, she does not accept blood products. She lives alone with support from a nearby friend and a daughter who visits weekly. She aims for 60 ounces of fluid intake daily and is encouraged to stay active despite anticipated treatment-related fatigue.     All other systems were reviewed with the patient and are negative.  MEDICAL HISTORY:  Past Medical History:  Diagnosis Date   Anxiety    Bipolar disorder (HCC)    Dr. Vincente   COPD (chronic obstructive pulmonary disease) (HCC) 2006   Depression    Hypothyroidism    Rheumatic fever    as a child, age 33yo   Urinary tract infection    hx/o   Wears glasses     SURGICAL HISTORY: Past Surgical History:  Procedure Laterality Date   ABDOMINAL HYSTERECTOMY     APPENDECTOMY     CESAREAN SECTION     UTERINE FIBROID SURGERY      I have reviewed the social history and family history with the patient and they are unchanged from previous note.  ALLERGIES:  is allergic to codeine.  MEDICATIONS:  Current Outpatient Medications  Medication Sig Dispense Refill   acetaminophen  (TYLENOL ) 500 MG tablet Take 1,000 mg by mouth every 4 (four) hours as needed for moderate pain.     albuterol  (VENTOLIN  HFA) 108 (90 Base) MCG/ACT inhaler Inhale 2 puffs into the lungs every 6 (six) hours as needed for wheezing or shortness of breath. 1 each 3   ALBUTEROL  IN Albuterol  inhaler     atorvastatin  (LIPITOR) 20 MG  tablet Take 20 mg by mouth daily.   3   baclofen  (LIORESAL ) 10 MG tablet TAKE 1 TABLET THREE TIMES DAILY     buPROPion  (WELLBUTRIN  XL) 150 MG 24 hr tablet Take 300 mg by mouth every morning.      diazepam (VALIUM) 5 MG tablet Take 1 tablet by mouth in the morning and at bedtime.     Docusate Sodium 100 MG capsule Take 100 mg by mouth.     fluticasone  (FLONASE) 50 MCG/ACT nasal spray Place 2 sprays into both nostrils.     Fluticasone -Umeclidin-Vilant (TRELEGY ELLIPTA ) 100-62.5-25 MCG/ACT AEPB Inhale 1 puff into the lungs daily. 180 each 0   gabapentin  (NEURONTIN ) 600 MG tablet Take 300-900 mg by mouth 2 (two) times daily. Take 0.5 tablet (300 mg) in the morning and Take 1.5 (900 mg) in the evening     guaiFENesin  (MUCINEX ) 600 MG 12 hr tablet Take 600 mg by mouth 2 (two) times daily as needed for cough.     ipratropium-albuterol  (DUONEB) 0.5-2.5 (3) MG/3ML SOLN Take 3 mLs by nebulization every 4 (four) hours as needed. 360 mL 1   lamoTRIgine  (LAMICTAL ) 200 MG tablet Take 200 mg by mouth at bedtime.      levothyroxine  (SYNTHROID , LEVOTHROID) 75 MCG tablet Take 0.75 mcg by mouth daily.     Lidocaine -Hydrocortisone  Ace 2.8-0.55 % GEL  Use 1 application externally QID prn 100 g 1   loratadine (CLARITIN) 10 MG tablet Take 10 mg by mouth daily.     montelukast  (SINGULAIR ) 10 MG tablet Take 1 tablet (10 mg total) by mouth at bedtime. 90 tablet 3   Multiple Vitamins-Minerals (CENTRUM ADULT PO) Take by mouth.     naltrexone (DEPADE) 50 MG tablet Take 50 mg by mouth.     ondansetron  (ZOFRAN ) 8 MG tablet Take 1 tablet (8 mg total) by mouth every 8 (eight) hours as needed for nausea or vomiting. Do not use for 72 hours following chemotherapy. 30 tablet 1   polyethylene glycol (MIRALAX) 17 g packet MiraLax     prochlorperazine  (COMPAZINE ) 10 MG tablet Take 1 tablet (10 mg total) by mouth every 6 (six) hours as needed for nausea or vomiting. 30 tablet 1   Specialty Vitamins Products (HAIR NOURISHING SUPPLEMENT  PO) Nutrafol hair supplement.  4 tabs daily.     valACYclovir (VALTREX) 500 MG tablet Take 500 mg by mouth daily.     zolpidem  (AMBIEN ) 10 MG tablet Take 10 mg by mouth daily.     No current facility-administered medications for this visit.   Facility-Administered Medications Ordered in Other Visits  Medication Dose Route Frequency Provider Last Rate Last Admin   0.9 %  sodium chloride  infusion   Intravenous Continuous [name], MD 10 mL/hr at 09/15/23 1132 New Bag at 09/15/23 1132   fluorouracil (ADRUCIL) 7,000 mg in sodium chloride  0.9 % 110 mL chemo infusion  1,000 mg/m2/day (Order-Specific) Intravenous 4 days [name], MD        PHYSICAL EXAMINATION: ECOG PERFORMANCE STATUS: 0 - Asymptomatic  Vitals:   09/15/23 1022  BP: (!) 118/58  Pulse: 74  Resp: 16  Temp: 98.1 F (36.7 C)  SpO2: 95%   Wt Readings from Last 3 Encounters:  09/15/23 153 lb (69.4 kg)  09/09/23 153 lb (69.4 kg)  08/26/23 154 lb (69.9 kg)     GENERAL:alert, no distress and comfortable SKIN: skin color, texture, turgor are normal, no rashes or significant lesions EYES: normal, Conjunctiva are pink and non-injected, sclera clear Musculoskeletal:no cyanosis of digits and no clubbing  NEURO: alert & oriented x 3 with fluent speech, no focal motor/sensory deficits  Physical Exam    LABORATORY DATA:  I have reviewed the data as listed    Latest Ref Rng & Units 09/15/2023    9:56 AM 08/26/2023    1:11 PM 10/10/2019    6:45 AM  CBC  WBC 4.0 - 10.5 K/uL 7.6  18.6  20.3   Hemoglobin 12.0 - 15.0 g/dL 88.2  87.7  88.5   Hematocrit 36.0 - 46.0 % 36.1  37.3  36.2   Platelets 150 - 400 K/uL 309  377  393         Latest Ref Rng & Units 09/15/2023    9:56 AM 08/26/2023    1:11 PM 10/10/2019    6:45 AM  CMP  Glucose 70 - 99 mg/dL 93  97  887   BUN 8 - 23 mg/dL 19  14  13    Creatinine 0.44 - 1.00 mg/dL 9.02  8.83  9.10   Sodium 135 - 145 mmol/L 139  136  143   Potassium 3.5 - 5.1 mmol/L 4.4  3.9  4.1    Chloride 98 - 111 mmol/L 107  102  109   CO2 22 - 32 mmol/L 25  26  24    Calcium  8.9 -  10.3 mg/dL 89.9  9.9  89.7   Total Protein 6.5 - 8.1 g/dL 7.5  8.2    Total Bilirubin 0.0 - 1.2 mg/dL 0.4  0.3    Alkaline Phos 38 - 126 U/L 85  102    AST 15 - 41 U/L 18  13    ALT 0 - 44 U/L 17  9        RADIOGRAPHIC STUDIES: I have personally reviewed the radiological images as listed and agreed with the findings in the report. IR PICC PLACEMENT RIGHT >5 YRS INC IMG GUIDE Result Date: 09/15/2023 INDICATION: Patient with history of anal carcinoma; central venous access requested for chemotherapy EXAM: RIGHT UPPER EXTREMITY PICC LINE PLACEMENT WITH ULTRASOUND AND FLUOROSCOPIC GUIDANCE MEDICATIONS: 3 mL 1% lidocaine  to skin and subcutaneous tissue ANESTHESIA/SEDATION: None FLUOROSCOPY: Radiation Exposure Index (as provided by the fluoroscopic device): 1 mGy Kerma COMPLICATIONS: None immediate. PROCEDURE: The patient was advised of the possible risks and complications and agreed to undergo the procedure. The patient was then brought to the angiographic suite for the procedure. The right/left arm was prepped with chlorhexidine, draped in the usual sterile fashion using maximum barrier technique (cap and mask, sterile gown, sterile gloves, large sterile sheet, hand hygiene and cutaneous antisepsis) and infiltrated locally with 1% Lidocaine . Ultrasound demonstrated patency of the right basilic vein, and this was documented with an image. Under real-time ultrasound guidance, this vein was accessed with a 21 gauge micropuncture needle and image documentation was performed. A 0.018 wire was introduced in to the vein. Over this, a 5 [place] dual lumen power injectable PICC was advanced to the lower SVC/right atrial junction. Fluoroscopy during the procedure and fluoro spot radiograph confirms appropriate catheter position. The catheter was flushed and covered with a sterile dressing. Catheter length: 40 cm IMPRESSION:  Successful right arm power PICC line placement with ultrasound and fluoroscopic guidance. The catheter is ready for use. Performed by: Franky Kelsie RIGGERS Electronically Signed   By: Marcey Moan M.D.   On: 09/15/2023 09:50      No orders of the defined types were placed in this encounter.  All questions were answered. The patient knows to call the clinic with any problems, questions or concerns. No barriers to learning was detected. The total time spent in the appointment was 40 minutes, including review of chart and various tests results, discussions about plan of care and coordination of care plan     Onita Mattock, MD 09/15/2023

## 2023-09-15 NOTE — Progress Notes (Signed)
 PATIENT NAVIGATOR PROGRESS NOTE  Name: Misty [name] Date: 09/15/2023 MRN: [id number]  DOB: [birth year]   Reason for visit:  First Day of Chemo/Radiation  Comments:   Patient in office for first day of chemo/radiation.  Patient seen in infusion room along with her daughter.  Informed patient that she will now need to call the Cancer Center main number and follow the prompts to talk to Dr. Demetra team from this point forward.  Patient and daughter verbalized understanding.    Orders were entered for PICC line insertion to coordinate with her second week of treatment.  Will follow-up to make sure PICC line placement has been scheduled.    Time spent counseling/coordinating care: 30-45 minutes

## 2023-09-15 NOTE — Assessment & Plan Note (Signed)
 cT3N1M0, stage IIIC -Presented with rectal pain and colonoscopy showed a 3 cm mass in the anus.  Biopsy confirmed squamous cell carcinoma. - Staging PET scan showed intense of hypermetabolic activity extending from the anus into the rectum, and the small bilateral inguinal lymph nodes with low-level activity.  No evidence of distant metastasis. -She will start concurrent chemoradiation with mitomycin and 5-FU on September 15, 2023.

## 2023-09-16 ENCOUNTER — Other Ambulatory Visit: Payer: Self-pay

## 2023-09-16 ENCOUNTER — Ambulatory Visit
Admission: RE | Admit: 2023-09-16 | Discharge: 2023-09-16 | Disposition: A | Discharge status: Home / Self Care | Payer: Medicare (Managed Care) | Source: Ambulatory Visit | Attending: Radiation Oncology

## 2023-09-16 ENCOUNTER — Telehealth: Payer: Self-pay | Admitting: Dietician

## 2023-09-16 ENCOUNTER — Encounter: Payer: Self-pay | Admitting: Hematology

## 2023-09-16 ENCOUNTER — Encounter: Payer: Medicare (Managed Care) | Admitting: Dietician

## 2023-09-16 DIAGNOSIS — Z510 Encounter for antineoplastic radiation therapy: Secondary | ICD-10-CM | POA: Diagnosis not present

## 2023-09-16 LAB — RAD ONC ARIA SESSION SUMMARY
Course Elapsed Days: 1
Plan Fractions Treated to Date: 2
Plan Prescribed Dose Per Fraction: 1.8 Gy
Plan Total Fractions Prescribed: 30
Plan Total Prescribed Dose: 54 Gy
Reference Point Dosage Given to Date: 3.6 Gy
Reference Point Session Dosage Given: 1.8 Gy
Session Number: 2

## 2023-09-16 NOTE — Telephone Encounter (Signed)
 Nutrition Assessment   Reason for Assessment: New anal cancer   ASSESSMENT: 68 year old female with stage III SCC of anus. She is receiving concurrent chemoradiation with mitomycin q32d. Pt is under the care of Dr. Dewey and Dr. Lanny.   Past medical history includes bipolar one disorder, hypothyroidism, COPD  Spoke with pt via telephone. She reports horrible appetite secondary to nausea with vomiting after chemotherapy. This started yesterday evening after eating a cup of yogurt and lasted ~4 hours. Pt unable to keep antiemetic down. She has had saltines, banana and water today. Typically has a Boost every morning, however does not feel she will be able to tolerate this during treatment. Pt is unsure of what to eat. She is asking when to expect side effects of radiation. Patient has not had a BM since stopping miralax 2 days ago.   Nutrition Focused Physical Exam: unable to complete (telephone visit)  Medications: lipitor, baclofen , bupropion , valium, docusate, trelegy, gabapentin , lamictal , synthroid , miralax, compazine , valtrex, ambien   Labs: 6/23 labs reviewed    Anthropometrics:   Height: 5'3 Weight: 153 lb  UBW: 150-160 lb  BMI: 27.10   NUTRITION DIAGNOSIS: Food and nutrition related knowledge deficit related to cancer as evidenced by no prior need for associated nutrition information  INTERVENTION:  Educated on small frequent meals/snacks and choosing lower fiber foods  Discussed alternate ONS ideas (fairlife milk and protein shakes) Educated on importance of hydration with expected increased fluid losses from frequent BM Take antiemetics per MD Continue bowel regimen for now Will leave handouts, ONS samples, Banatrol packets with RT for pt to p/u 6/25  MONITORING, EVALUATION, GOAL: Pt will tolerate adequate calories and protein to minimize wt loss during treatment    Next Visit: Thursday July 3 via telephone

## 2023-09-16 NOTE — Telephone Encounter (Signed)
 Called pt to see how she did with her treatment.  She reports some nausea & vomiting after she got home & ate some yogurt.  She called on call service & they took a while to get back with her. By the time they did, it had been enough time she could take other nausea med.  Explained how to take nausea meds.  She is doing better now & pump is doing ok.  She was concerned about adjusting bag.  She knows her appts & how to reach us  if needed.

## 2023-09-16 NOTE — Telephone Encounter (Signed)
-----   Message from Nurse Lavanda RAMAN sent at 09/15/2023  1:13 PM EDT ----- Regarding: Dr. Lanny pt first time mitomycin and 5FU Dr. Lanny pt  first time mitomycin and 5FU Tolerated well.

## 2023-09-16 NOTE — Progress Notes (Signed)
 PATIENT NAVIGATOR PROGRESS NOTE  Name: Misty [name] Date: 09/16/2023 MRN: [id number]  DOB: [birth year]   Patient's PICC line has been scheduled for next treatment.  Patient has been made aware.   Patient is established with a treatment plan and is actively engaged in care. Nurse Navigator services not currently indicated at this time. Will re-evaluate if needs change or if additional support is requested.

## 2023-09-17 ENCOUNTER — Other Ambulatory Visit: Payer: Self-pay

## 2023-09-17 ENCOUNTER — Ambulatory Visit
Admission: RE | Admit: 2023-09-17 | Discharge: 2023-09-17 | Disposition: A | Discharge status: Home / Self Care | Payer: Medicare (Managed Care) | Source: Ambulatory Visit | Attending: Radiation Oncology

## 2023-09-17 DIAGNOSIS — Z510 Encounter for antineoplastic radiation therapy: Secondary | ICD-10-CM | POA: Diagnosis not present

## 2023-09-17 LAB — RAD ONC ARIA SESSION SUMMARY
Course Elapsed Days: 2
Plan Fractions Treated to Date: 3
Plan Prescribed Dose Per Fraction: 1.8 Gy
Plan Total Fractions Prescribed: 30
Plan Total Prescribed Dose: 54 Gy
Reference Point Dosage Given to Date: 5.4 Gy
Reference Point Session Dosage Given: 1.8 Gy
Session Number: 3

## 2023-09-18 ENCOUNTER — Ambulatory Visit
Admission: RE | Admit: 2023-09-18 | Discharge: 2023-09-18 | Disposition: A | Discharge status: Home / Self Care | Payer: Medicare (Managed Care) | Source: Ambulatory Visit | Attending: Radiation Oncology | Admitting: Radiation Oncology

## 2023-09-18 ENCOUNTER — Other Ambulatory Visit: Payer: Self-pay

## 2023-09-18 DIAGNOSIS — Z510 Encounter for antineoplastic radiation therapy: Secondary | ICD-10-CM | POA: Diagnosis not present

## 2023-09-18 LAB — RAD ONC ARIA SESSION SUMMARY
Course Elapsed Days: 3
Plan Fractions Treated to Date: 4
Plan Prescribed Dose Per Fraction: 1.8 Gy
Plan Total Fractions Prescribed: 30
Plan Total Prescribed Dose: 54 Gy
Reference Point Dosage Given to Date: 7.2 Gy
Reference Point Session Dosage Given: 1.8 Gy
Session Number: 4

## 2023-09-19 ENCOUNTER — Other Ambulatory Visit: Payer: Self-pay

## 2023-09-19 ENCOUNTER — Inpatient Hospital Stay: Payer: Medicare (Managed Care)

## 2023-09-19 ENCOUNTER — Other Ambulatory Visit: Payer: Self-pay | Admitting: *Deleted

## 2023-09-19 ENCOUNTER — Other Ambulatory Visit (HOSPITAL_BASED_OUTPATIENT_CLINIC_OR_DEPARTMENT_OTHER): Payer: Self-pay

## 2023-09-19 ENCOUNTER — Ambulatory Visit
Admission: RE | Admit: 2023-09-19 | Discharge: 2023-09-19 | Disposition: A | Discharge status: Home / Self Care | Payer: Medicare (Managed Care) | Source: Ambulatory Visit | Attending: Radiation Oncology | Admitting: Radiation Oncology

## 2023-09-19 ENCOUNTER — Encounter: Payer: Self-pay | Admitting: Hematology

## 2023-09-19 ENCOUNTER — Inpatient Hospital Stay (HOSPITAL_BASED_OUTPATIENT_CLINIC_OR_DEPARTMENT_OTHER): Payer: Medicare (Managed Care) | Admitting: Hematology

## 2023-09-19 ENCOUNTER — Telehealth: Payer: Self-pay

## 2023-09-19 ENCOUNTER — Other Ambulatory Visit (HOSPITAL_COMMUNITY): Payer: Self-pay

## 2023-09-19 VITALS — BP 103/80 | HR 82 | Temp 99.4°F | Resp 18

## 2023-09-19 DIAGNOSIS — E860 Dehydration: Secondary | ICD-10-CM

## 2023-09-19 DIAGNOSIS — Z95828 Presence of other vascular implants and grafts: Secondary | ICD-10-CM

## 2023-09-19 DIAGNOSIS — Z510 Encounter for antineoplastic radiation therapy: Secondary | ICD-10-CM | POA: Diagnosis not present

## 2023-09-19 DIAGNOSIS — R112 Nausea with vomiting, unspecified: Secondary | ICD-10-CM

## 2023-09-19 DIAGNOSIS — C210 Malignant neoplasm of anus, unspecified: Secondary | ICD-10-CM | POA: Diagnosis not present

## 2023-09-19 LAB — RAD ONC ARIA SESSION SUMMARY
Course Elapsed Days: 4
Plan Fractions Treated to Date: 5
Plan Prescribed Dose Per Fraction: 1.8 Gy
Plan Total Fractions Prescribed: 30
Plan Total Prescribed Dose: 54 Gy
Reference Point Dosage Given to Date: 9 Gy
Reference Point Session Dosage Given: 1.8 Gy
Session Number: 5

## 2023-09-19 MED ORDER — NYSTATIN 100000 UNIT/ML MT SUSP
5.0000 mL | Freq: Three times a day (TID) | OROMUCOSAL | 1 refills | Status: DC | PRN
  Filled 2023-09-19: qty 140, 10d supply, fill #0
  Filled 2023-09-23: qty 140, 10d supply, fill #1

## 2023-09-19 MED ORDER — SODIUM CHLORIDE 0.9% FLUSH
10.0000 mL | Freq: Once | INTRAVENOUS | Status: AC
  Administered 2023-09-19: 10 mL

## 2023-09-19 MED ORDER — HEPARIN SOD (PORK) LOCK FLUSH 100 UNIT/ML IV SOLN
250.0000 [IU] | Freq: Once | INTRAVENOUS | Status: AC
  Administered 2023-09-19: 250 [IU]

## 2023-09-19 MED ORDER — ONDANSETRON HCL 4 MG/2ML IJ SOLN: 8.0000 mg | Freq: Once | INTRAMUSCULAR | Status: DC

## 2023-09-19 NOTE — Progress Notes (Signed)
 PICC Removal Note: S: Patient through with infusion cycle, per providers order Picc line is due to be pulled/ O: PICC line removed from right antecubital after sterile site prepped per protocol. PICC catheter tip visualized and intact. Picc line was measured at 40CM as when it was instilled. Pressure dressing applied with Vaseline Gause and sterile dressing and tape. A: No redness, ecchymosis, edema, swelling, or drainage noted at site. P: Instructions provided on post PICC discharge care, including followup notification instructions.

## 2023-09-19 NOTE — Telephone Encounter (Signed)
 Pt called stating she's having severe n/v from her chemo.  Pt stated she called the on-call service on Tuesday 09/16/2023 and they instructed pt to stagger her antiemetics d/t pt was only taking Compazine  at the time of her call.  Pt stated once she started staggering her antiemetics she found the Ondansetron  worked better but continued to take the medications as instructed.  Pt stated over time the n/v did not get better and she begun to feel weaker and weaker.  Pt stated she's hardly eating (crackers, soup, and fruit) and drinking less than 16 oz of water per day.  Instructed pt to drink Liquid IV in 16 oz bottle water and to take Famotidine to help settle pt's stomach.  Pt verbalize understanding.  Stated this nurse will make Dr. Lanny aware of the pt's called and symptoms.  Pt is coming in today for 5FU stop & PICC removal.

## 2023-09-19 NOTE — Patient Instructions (Signed)
 PICC Removal, Adult, Care After The following information offers guidance on how to care for yourself after your procedure. Your health care provider may also give you more specific instructions. If you have problems or questions, contact your health care provider. What can I expect after the procedure? After the procedure, it is common to have: Tenderness or soreness. Redness, swelling, or a scab at the place where your PICC was removed (exit site). Follow these instructions at home: For the first 24 hours after the procedure: Keep the bandage (dressing) on your exit site clean and dry. Do not remove your dressing until your health care provider tells you to do so. Do not lift anything heavy or do activities that require great effort until your health care provider says it is okay. You should avoid: Lifting weights. Doing yard work. Doing any physical activity with repetitive arm movement. Watch closely for any signs of an air bubble in the vein (air embolism). This is a rare but serious complication. Signs of an air embolism include trouble breathing, wheezing, chest pain, or a fast pulse. If you have signs of an air embolism, call 911 right away and lie down on your left side to keep the air from moving into your lungs. After 24 hours have passed:  Remove your dressing as told by your health care provider. Wash your hands with soap and water for at least 20 seconds before and after you change your dressing. If soap and water are not available, use hand sanitizer. Return to your normal activities as told by your health care provider. A small scab may develop over the exit site. Do not pick at the scab. When bathing or showering, gently wash the exit site with soap and water. Pat it dry. Watch for signs of infection, such as: A fever or chills. Swollen glands under your arm. More redness, swelling, or soreness around your arm. Blood, fluid, or pus coming from your exit site. Warmth or a  bad smell coming from your exit site. A red streak spreading away from your exit site. General instructions Take over-the-counter and prescription medicines only as told by your health care provider. Do not take any new medicines without checking with your health care provider first. If you were given an antibiotic ointment, apply it as told by your health care provider. Keep all follow-up visits. This is important. Contact a health care provider if: You have a fever or chills. You have swelling at your exit site or swollen glands under your arm. You have signs of infection at your exit site. You have soreness, redness, or swelling in your arm that gets worse. Get help right away if: You have numbness or tingling in your fingers, hand, or arm. Your arm looks blue and feels cold. You have signs of an air embolism, such as trouble breathing, wheezing, chest pain, or a fast pulse. These symptoms may be an emergency. Get medical help right away. Call 911. Do not wait to see if the symptoms will go away. Do not drive yourself to the hospital. Summary After a PICC is removed, it is common to have tenderness or soreness, redness, swelling, or a scab at the exit site. Keep the bandage (dressing) over the exit site clean and dry. Do not remove the dressing until your health care provider tells you to do so. Do not lift anything heavy or do activities that require great effort until your health care provider says it is okay. Watch closely for any signs  of an air bubble (air embolism). If you have signs of an air embolism, call 911 right away and lie down on your left side. This information is not intended to replace advice given to you by your health care provider. Make sure you discuss any questions you have with your health care provider. Document Revised: 09/27/2020 Document Reviewed: 09/27/2020 Elsevier Patient Education  2024 [employer].

## 2023-09-19 NOTE — Progress Notes (Signed)
 Patient submitted documents and completed grant paperwork on 09/09/23.  She was approved for one-time $1000 [employer] grant to assist with personal expenses while going through treatment. She received a copy of the grant approval letter and expense sheet in green folder to review and call at earliest convenience to discuss.  She has my card to do so and for any additional financial questions or concerns.

## 2023-09-19 NOTE — Progress Notes (Signed)
 Verbal order w/readback from Dr. Lanny for Magic Mouthwash 1 refill 5ml TID PRN.  Pt c/o mouth sores during PICC appt today.  Order placed at [facility].  Pt's aware of prescription.

## 2023-09-19 NOTE — Telephone Encounter (Signed)
 Received telephone call from the patient in regards to a conversation she had with Anders, Charity fundraiser.  Patient stated she has taken the Valium and it did help but she would like to further discuss the alternative, Ativan . Let patient know that I would forward her message to [facility] so that she could further advise. Patient verbalized understanding.

## 2023-09-21 ENCOUNTER — Encounter: Payer: Self-pay | Admitting: Hematology

## 2023-09-21 NOTE — Progress Notes (Signed)
 Pt was not seen in person, I spoke with her infusion nurse.   Misty [name]

## 2023-09-22 ENCOUNTER — Encounter: Payer: Self-pay | Admitting: Nurse Practitioner

## 2023-09-22 ENCOUNTER — Inpatient Hospital Stay: Payer: Medicare (Managed Care)

## 2023-09-22 ENCOUNTER — Ambulatory Visit
Admission: RE | Admit: 2023-09-22 | Discharge: 2023-09-22 | Disposition: A | Discharge status: Home / Self Care | Payer: Medicare (Managed Care) | Source: Ambulatory Visit | Attending: Radiation Oncology

## 2023-09-22 ENCOUNTER — Ambulatory Visit (HOSPITAL_BASED_OUTPATIENT_CLINIC_OR_DEPARTMENT_OTHER): Payer: Medicare (Managed Care) | Admitting: Nurse Practitioner

## 2023-09-22 ENCOUNTER — Other Ambulatory Visit: Payer: Self-pay

## 2023-09-22 ENCOUNTER — Other Ambulatory Visit: Payer: Self-pay | Admitting: Nurse Practitioner

## 2023-09-22 VITALS — BP 124/68 | HR 76 | Temp 97.6°F | Resp 17 | Wt 151.1 lb

## 2023-09-22 DIAGNOSIS — R112 Nausea with vomiting, unspecified: Secondary | ICD-10-CM | POA: Diagnosis not present

## 2023-09-22 DIAGNOSIS — C210 Malignant neoplasm of anus, unspecified: Secondary | ICD-10-CM | POA: Diagnosis not present

## 2023-09-22 DIAGNOSIS — Z510 Encounter for antineoplastic radiation therapy: Secondary | ICD-10-CM | POA: Diagnosis not present

## 2023-09-22 LAB — CMP (CANCER CENTER ONLY)
ALT: 12 U/L (ref 0–44)
AST: 15 U/L (ref 15–41)
Albumin: 4 g/dL (ref 3.5–5.0)
Alkaline Phosphatase: 70 U/L (ref 38–126)
Anion gap: 6 (ref 5–15)
BUN: 16 mg/dL (ref 8–23)
CO2: 27 mmol/L (ref 22–32)
Calcium: 9.4 mg/dL (ref 8.9–10.3)
Chloride: 107 mmol/L (ref 98–111)
Creatinine: 1.08 mg/dL — ABNORMAL HIGH (ref 0.44–1.00)
GFR, Estimated: 56 mL/min — ABNORMAL LOW (ref >60)
Glucose, Bld: 62 mg/dL — ABNORMAL LOW (ref 70–99)
Potassium: 3.7 mmol/L (ref 3.5–5.1)
Sodium: 140 mmol/L (ref 135–145)
Total Bilirubin: 0.4 mg/dL (ref 0.0–1.2)
Total Protein: 6.8 g/dL (ref 6.5–8.1)

## 2023-09-22 LAB — CBC WITH DIFFERENTIAL (CANCER CENTER ONLY)
Abs Immature Granulocytes: 0.04 10*3/uL (ref 0.00–0.07)
Basophils Absolute: 0 10*3/uL (ref 0.0–0.1)
Basophils Relative: 0 %
Eosinophils Absolute: 1 10*3/uL — ABNORMAL HIGH (ref 0.0–0.5)
Eosinophils Relative: 18 %
HCT: 33.7 % — ABNORMAL LOW (ref 36.0–46.0)
Hemoglobin: 11 g/dL — ABNORMAL LOW (ref 12.0–15.0)
Immature Granulocytes: 1 %
Lymphocytes Relative: 8 %
Lymphs Abs: 0.4 10*3/uL — ABNORMAL LOW (ref 0.7–4.0)
MCH: 30.6 pg (ref 26.0–34.0)
MCHC: 32.6 g/dL (ref 30.0–36.0)
MCV: 93.9 fL (ref 80.0–100.0)
Monocytes Absolute: 0.1 10*3/uL (ref 0.1–1.0)
Monocytes Relative: 3 %
Neutro Abs: 3.8 10*3/uL (ref 1.7–7.7)
Neutrophils Relative %: 70 %
Platelet Count: 224 10*3/uL (ref 150–400)
RBC: 3.59 MIL/uL — ABNORMAL LOW (ref 3.87–5.11)
RDW: 13.4 % (ref 11.5–15.5)
WBC Count: 5.4 10*3/uL (ref 4.0–10.5)
nRBC: 0 % (ref 0.0–0.2)

## 2023-09-22 LAB — RAD ONC ARIA SESSION SUMMARY
Course Elapsed Days: 7
Plan Fractions Treated to Date: 6
Plan Prescribed Dose Per Fraction: 1.8 Gy
Plan Total Fractions Prescribed: 30
Plan Total Prescribed Dose: 54 Gy
Reference Point Dosage Given to Date: 10.8 Gy
Reference Point Session Dosage Given: 1.8 Gy
Session Number: 6

## 2023-09-22 LAB — MAGNESIUM: Magnesium: 1.8 mg/dL (ref 1.7–2.4)

## 2023-09-22 MED ORDER — DEXAMETHASONE 4 MG PO TABS: ORAL_TABLET | ORAL | 0 refills | Status: DC

## 2023-09-22 MED ORDER — LORAZEPAM 0.5 MG PO TABS: 0.5000 mg | ORAL_TABLET | Freq: Three times a day (TID) | ORAL | 0 refills | Status: DC | PRN

## 2023-09-22 MED ORDER — PROMETHAZINE HCL 25 MG PO TABS
25.0000 mg | ORAL_TABLET | Freq: Four times a day (QID) | ORAL | 0 refills | Status: DC | PRN
Start: 2023-09-22 — End: 2023-10-13

## 2023-09-22 NOTE — Progress Notes (Signed)
 [facility]   Telephone:(336) [phone] Fax:(336) [phone]    Patient Care Team: Beam, [name], MD as PCP - General (Family Medicine) Ardis, [name], RN as Oncology Nurse Navigator [name] MATSU, NP as Nurse Practitioner (Psychology) [name], MD as Consulting Physician (Hematology and Oncology)   CHIEF COMPLAINT: Follow up anal cancer   Oncology History  Squamous cell carcinoma of anus (HCC)  08/26/2023 Initial Diagnosis   Squamous cell carcinoma of anus (HCC)   09/09/2023 Cancer Staging   Staging form: Anus, AJCC 8th Edition - Clinical: Stage IIIC (cT3, cN1a, cM0) - Signed by [name], MD on 09/09/2023   09/15/2023 -  Chemotherapy   Patient is on Treatment Plan : ANUS Mitomycin  D1,28 + 5FU D1-4, 28-31 q32d        CURRENT THERAPY: Concurrent chemoRT with mitomycin /5FU starting 09/15/23  INTERVAL HISTORY Ms. Misty [name] returns for follow up as scheduled. Last seen 6/23 with C1D1 chemoRT 5FU/mitomycin . First week was bad, with daily nausea and vomiting on days 1 and 4. Compazine  was not effective and waited too long to try Zofran . Our office instructed to take valium q6 hours which kept nausea at bay. Received fluids with PICC d/c on day 5. She developed mucositis on her lips, tongue, and throat. Baking soda and liquid IV burned her mouth but can do salt water and finding things to drink but could not eat much. Still has rectal pain but no skin change, bleeding, or discharge. Bowels moving well thus far. Denies fever/chills. Takes tylenol  for daily headache.    ROS  All other systems reviewed and negative  Past Medical History:  Diagnosis Date   Anxiety    Bipolar disorder (HCC)    Dr. Vincente   COPD (chronic obstructive pulmonary disease) (HCC) 2006   Depression    Hypothyroidism    Rheumatic fever    as a child, age 45yo   Urinary tract infection    hx/o   Wears glasses      Past Surgical History:  Procedure Laterality Date    ABDOMINAL HYSTERECTOMY     APPENDECTOMY     CESAREAN SECTION     UTERINE FIBROID SURGERY       Outpatient Encounter Medications as of 09/22/2023  Medication Sig Note   dexamethasone (DECADRON) 4 MG tablet Take 1 tablet by mouth in the morning with meals, for 3-5 days after chemo    LORazepam  (ATIVAN ) 0.5 MG tablet Take 1-2 tablets (0.5-1 mg total) by mouth every 8 (eight) hours as needed for anxiety.    promethazine (PHENERGAN) 25 MG tablet Take 1 tablet (25 mg total) by mouth every 6 (six) hours as needed for nausea or vomiting.    acetaminophen  (TYLENOL ) 500 MG tablet Take 1,000 mg by mouth every 4 (four) hours as needed for moderate pain.    albuterol  (VENTOLIN  HFA) 108 (90 Base) MCG/ACT inhaler Inhale 2 puffs into the lungs every 6 (six) hours as needed for wheezing or shortness of breath.    ALBUTEROL  IN Albuterol  inhaler    atorvastatin  (LIPITOR) 20 MG tablet Take 20 mg by mouth daily.  11/27/2014: Received from: External Pharmacy Received Sig: Take 20 mg by mouth daily.   baclofen  (LIORESAL ) 10 MG tablet TAKE 1 TABLET THREE TIMES DAILY    buPROPion  (WELLBUTRIN  XL) 150 MG 24 hr tablet Take 300 mg by mouth every morning.     diazepam (VALIUM) 5 MG tablet Take 1 tablet by mouth in the  morning and at bedtime.    Docusate Sodium 100 MG capsule Take 100 mg by mouth.    fluticasone  (FLONASE) 50 MCG/ACT nasal spray Place 2 sprays into both nostrils.    Fluticasone -Umeclidin-Vilant (TRELEGY ELLIPTA ) 100-62.5-25 MCG/ACT AEPB Inhale 1 puff into the lungs daily.    gabapentin  (NEURONTIN ) 600 MG tablet Take 300-900 mg by mouth 2 (two) times daily. Take 0.5 tablet (300 mg) in the morning and Take 1.5 (900 mg) in the evening    guaiFENesin  (MUCINEX ) 600 MG 12 hr tablet Take 600 mg by mouth 2 (two) times daily as needed for cough.    ipratropium-albuterol  (DUONEB) 0.5-2.5 (3) MG/3ML SOLN Take 3 mLs by nebulization every 4 (four) hours as needed.    lamoTRIgine  (LAMICTAL ) 200 MG tablet Take 200 mg by  mouth at bedtime.     levothyroxine  (SYNTHROID , LEVOTHROID) 75 MCG tablet Take 0.75 mcg by mouth daily.    loratadine (CLARITIN) 10 MG tablet Take 10 mg by mouth daily.    magic mouthwash (nystatin, lidocaine , diphenhydrAMINE, alum & mag hydroxide) suspension Swish and swallow 5 mLs by mouth 3 (three) times daily as needed for mouth pain.    montelukast  (SINGULAIR ) 10 MG tablet Take 1 tablet (10 mg total) by mouth at bedtime.    Multiple Vitamins-Minerals (CENTRUM ADULT PO) Take by mouth.    naltrexone (DEPADE) 50 MG tablet Take 50 mg by mouth.    ondansetron  (ZOFRAN ) 8 MG tablet Take 1 tablet (8 mg total) by mouth every 8 (eight) hours as needed for nausea or vomiting. Do not use for 72 hours following chemotherapy.    polyethylene glycol (MIRALAX) 17 g packet MiraLax    Specialty Vitamins Products (HAIR NOURISHING SUPPLEMENT PO) Nutrafol hair supplement.  4 tabs daily.    valACYclovir (VALTREX) 500 MG tablet Take 500 mg by mouth daily.    zolpidem  (AMBIEN ) 10 MG tablet Take 10 mg by mouth daily.    [DISCONTINUED] prochlorperazine  (COMPAZINE ) 10 MG tablet Take 1 tablet (10 mg total) by mouth every 6 (six) hours as needed for nausea or vomiting.    No facility-administered encounter medications on file as of 09/22/2023.     Today's Vitals   09/22/23 1149  BP: 124/68  Pulse: 76  Resp: 17  Temp: 97.6 F (36.4 C)  SpO2: 97%  Weight: 151 lb 1.6 oz (68.5 kg)   Body mass index is 26.77 kg/m.   ECOG PERFORMANCE STATUS: 2 - Symptomatic, <50% confined to bed  PHYSICAL EXAM GENERAL:alert, no distress and comfortable SKIN: no rash  HEENT: sclera clear. Mucositis/several ulcers on lower lip on tongue LUNGS:  normal breathing effort HEART: no lower extremity edema NEURO: alert & oriented x 3 with fluent speech, no focal motor/sensory deficits   CBC    Latest Ref Rng & Units 09/22/2023    9:58 AM 09/15/2023    9:56 AM 08/26/2023    1:11 PM  CBC  WBC 4.0 - 10.5 K/uL 5.4  7.6  18.6    Hemoglobin 12.0 - 15.0 g/dL 88.9  88.2  87.7   Hematocrit 36.0 - 46.0 % 33.7  36.1  37.3   Platelets 150 - 400 K/uL 224  309  377       CMP     Latest Ref Rng & Units 09/22/2023    9:58 AM 09/15/2023    9:56 AM 08/26/2023    1:11 PM  CMP  Glucose 70 - 99 mg/dL 62  93  97   BUN 8 -  23 mg/dL 16  19  14    Creatinine 0.44 - 1.00 mg/dL 8.91  9.02  8.83   Sodium 135 - 145 mmol/L 140  139  136   Potassium 3.5 - 5.1 mmol/L 3.7  4.4  3.9   Chloride 98 - 111 mmol/L 107  107  102   CO2 22 - 32 mmol/L 27  25  26    Calcium  8.9 - 10.3 mg/dL 9.4  89.9  9.9   Total Protein 6.5 - 8.1 g/dL 6.8  7.5  8.2   Total Bilirubin 0.0 - 1.2 mg/dL 0.4  0.4  0.3   Alkaline Phos 38 - 126 U/L 70  85  102   AST 15 - 41 U/L 15  18  13    ALT 0 - 44 U/L 12  17  9        ASSESSMENT & PLAN:  Squamous cell carcinoma of anus, cT3N1M0, stage IIIC -Presented with rectal pain and colonoscopy showed a 3 cm mass in the anus.  Biopsy confirmed squamous cell carcinoma. - Staging PET scan showed intense of hypermetabolic activity extending from the anus into the rectum, and the small bilateral inguinal lymph nodes with low-level activity.  No evidence of distant metastasis. -Started concurrent chemoradiation with mitomycin  and 5-FU on September 15, 2023. - She tolerated the first week poorly with uncontrolled nausea and vomiting, not well-managed at home, required IVF in clinic.  She has developed mucositis. - Reviewed symptom management, she will continue Magic mouthwash and salt water rinses.  - Will replace Compazine  with Phenergan and alternate with Zofran , add Ativan  (replace valium) for refractory N/V, and prescribe Decadron daily for a few days to help appetite and N/V.  Will take it daily for 3 days starting now, then again after week 5 chemo. Discussed possible dose reduction if she does not recover well -She declined IVF but will let us  know if she gets dehydrated  -Labs reviewed, trending down but still within normal.  Will monitor. Reviewed infection risk/precautions  -Continue weekly lab and follow up on treatment    PLAN: -Labs reviewed -Symptom management for mucositis and N/V (magic mouthwash and salt water rinse; replace Compazine  with Phenergan and alternate with Zofran , replace Valium with Ativan  for refractory N/V and anxiety - knows not to take together or with Ambien - and prescribe Decadron daily for a few days to help appetite and N/V.  Will take it daily for 3 days starting now, then again after next chemo -Declined IVF for now -Weekly lab and f/up on chemoRT -Will add Emend, dex, and aloxi premeds with next chemo   All questions were answered. The patient knows to call the clinic with any problems, questions or concerns. No barriers to learning were detected. I spent 30 minutes counseling the patient face to face. The total time spent in the appointment was 30 minutes and more than 50% was on counseling, review of test results, and coordination of care.   Sabrinna Yearwood K Lalia Loudon, NP 09/22/2023

## 2023-09-23 ENCOUNTER — Other Ambulatory Visit (HOSPITAL_COMMUNITY): Payer: Self-pay

## 2023-09-23 ENCOUNTER — Other Ambulatory Visit: Payer: Self-pay

## 2023-09-23 ENCOUNTER — Ambulatory Visit
Admission: RE | Admit: 2023-09-23 | Discharge: 2023-09-23 | Disposition: A | Discharge status: Home / Self Care | Payer: Medicare (Managed Care) | Source: Ambulatory Visit | Attending: Radiation Oncology | Admitting: Radiation Oncology

## 2023-09-23 DIAGNOSIS — Z510 Encounter for antineoplastic radiation therapy: Secondary | ICD-10-CM | POA: Diagnosis present

## 2023-09-23 DIAGNOSIS — C210 Malignant neoplasm of anus, unspecified: Secondary | ICD-10-CM | POA: Insufficient documentation

## 2023-09-23 LAB — RAD ONC ARIA SESSION SUMMARY
Course Elapsed Days: 8
Plan Fractions Treated to Date: 7
Plan Prescribed Dose Per Fraction: 1.8 Gy
Plan Total Fractions Prescribed: 30
Plan Total Prescribed Dose: 54 Gy
Reference Point Dosage Given to Date: 12.6 Gy
Reference Point Session Dosage Given: 1.8 Gy
Session Number: 7

## 2023-09-24 ENCOUNTER — Telehealth: Payer: Self-pay

## 2023-09-24 ENCOUNTER — Other Ambulatory Visit: Payer: Self-pay

## 2023-09-24 ENCOUNTER — Telehealth: Payer: Self-pay | Admitting: Nurse Practitioner

## 2023-09-24 ENCOUNTER — Ambulatory Visit
Admission: RE | Admit: 2023-09-24 | Discharge: 2023-09-24 | Disposition: A | Discharge status: Home / Self Care | Payer: Medicare (Managed Care) | Source: Ambulatory Visit | Attending: Radiation Oncology | Admitting: Radiation Oncology

## 2023-09-24 DIAGNOSIS — Z510 Encounter for antineoplastic radiation therapy: Secondary | ICD-10-CM | POA: Diagnosis not present

## 2023-09-24 LAB — RAD ONC ARIA SESSION SUMMARY
Course Elapsed Days: 9
Plan Fractions Treated to Date: 8
Plan Prescribed Dose Per Fraction: 1.8 Gy
Plan Total Fractions Prescribed: 30
Plan Total Prescribed Dose: 54 Gy
Reference Point Dosage Given to Date: 14.4 Gy
Reference Point Session Dosage Given: 1.8 Gy
Session Number: 8

## 2023-09-24 NOTE — Telephone Encounter (Signed)
 Notified Patient and Pharmacy of prior authorization approvals for Lorazepam  0.5 mg Tablets and Promethazine 25 mg Tablets. Medications are approvd through 09/23/2024. No other needs or concerns noted at this time.

## 2023-09-24 NOTE — Telephone Encounter (Signed)
 Scheduled appointments per 7/1 los. Talked with the patient and she is aware of the made appointment.

## 2023-09-25 ENCOUNTER — Inpatient Hospital Stay: Payer: Medicare (Managed Care) | Attending: Nurse Practitioner | Admitting: Dietician

## 2023-09-25 ENCOUNTER — Other Ambulatory Visit: Payer: Self-pay

## 2023-09-25 ENCOUNTER — Other Ambulatory Visit (HOSPITAL_COMMUNITY): Payer: Self-pay

## 2023-09-25 ENCOUNTER — Ambulatory Visit
Admission: RE | Admit: 2023-09-25 | Discharge: 2023-09-25 | Disposition: A | Discharge status: Home / Self Care | Payer: Medicare (Managed Care) | Source: Ambulatory Visit | Attending: Radiation Oncology | Admitting: Radiation Oncology

## 2023-09-25 DIAGNOSIS — Z510 Encounter for antineoplastic radiation therapy: Secondary | ICD-10-CM | POA: Diagnosis not present

## 2023-09-25 DIAGNOSIS — E860 Dehydration: Secondary | ICD-10-CM | POA: Insufficient documentation

## 2023-09-25 DIAGNOSIS — C210 Malignant neoplasm of anus, unspecified: Secondary | ICD-10-CM | POA: Insufficient documentation

## 2023-09-25 DIAGNOSIS — Z5111 Encounter for antineoplastic chemotherapy: Secondary | ICD-10-CM | POA: Insufficient documentation

## 2023-09-25 DIAGNOSIS — D649 Anemia, unspecified: Secondary | ICD-10-CM | POA: Insufficient documentation

## 2023-09-25 DIAGNOSIS — E039 Hypothyroidism, unspecified: Secondary | ICD-10-CM | POA: Insufficient documentation

## 2023-09-25 DIAGNOSIS — Z79624 Long term (current) use of inhibitors of nucleotide synthesis: Secondary | ICD-10-CM | POA: Insufficient documentation

## 2023-09-25 DIAGNOSIS — Z79899 Other long term (current) drug therapy: Secondary | ICD-10-CM | POA: Insufficient documentation

## 2023-09-25 LAB — RAD ONC ARIA SESSION SUMMARY
Course Elapsed Days: 10
Plan Fractions Treated to Date: 9
Plan Prescribed Dose Per Fraction: 1.8 Gy
Plan Total Fractions Prescribed: 30
Plan Total Prescribed Dose: 54 Gy
Reference Point Dosage Given to Date: 16.2 Gy
Reference Point Session Dosage Given: 1.8 Gy
Session Number: 9

## 2023-09-25 MED ORDER — NYSTATIN 100000 UNIT/ML MT SUSP
5.0000 mL | Freq: Three times a day (TID) | OROMUCOSAL | 1 refills | Status: DC | PRN
  Filled 2023-09-25: qty 140, 10d supply, fill #0
  Filled 2023-10-16: qty 140, 10d supply, fill #1

## 2023-09-25 NOTE — Progress Notes (Signed)
 Nutrition Follow-up:  Pt with stage III SCC of anus. She is receiving concurrent chemoradiation with mitomycin  q32d. Pt is under the care of Dr. Dewey and Dr. Lanny.   Met with patient and friend in office following radiation. Patient reports radiation induced diarrhea started last night. Reports 3 watery stools. Appetite is improving, although intake is limited due to mouth sores. Tolerating smooth textures and liquids via straw. Patient is taking MMW as prescribed. She is almost out of this and asking to have this refilled. Nausea has resolved. Patient tried fairlife milk and likes this, especially the chocolate. She is able to tolerate Boost again. Tries to drink 2/day. Yesterday recalls 2 Boost, 1 premier, caramel flan, watermelon, 1/2 [place] sandwich, vanilla/orange ice cream.    Medications: MMW, phenergan, ativan  (6/30)  Labs: 6/30 - glucose 62, Cr 1.08  Anthropometrics: Wt 153 lb today per pt friend redia) - stable  6/24 - 153 lb    NUTRITION DIAGNOSIS: Food and nutrition related knowledge deficit improving    INTERVENTION:  Continue small frequent meals/snacks, bites/sips every few hours Reviewed foods with protein, recommend protein source at every meal Continue 2 Boost Plus and fairlife milk Pt will try Banatrol samples Reviewed foods with pectin to aid with bulking of stool  Continue MMW - secure chat to PA-C with refill request   MONITORING, EVALUATION, GOAL: wt trends, intake   NEXT VISIT: Monday July 21 during infusion with Joli

## 2023-09-29 ENCOUNTER — Ambulatory Visit
Admission: RE | Admit: 2023-09-29 | Discharge: 2023-09-29 | Disposition: A | Discharge status: Home / Self Care | Payer: Medicare (Managed Care) | Source: Ambulatory Visit | Attending: Radiation Oncology | Admitting: Radiation Oncology

## 2023-09-29 ENCOUNTER — Other Ambulatory Visit: Payer: Self-pay

## 2023-09-29 DIAGNOSIS — Z510 Encounter for antineoplastic radiation therapy: Secondary | ICD-10-CM | POA: Diagnosis not present

## 2023-09-29 LAB — RAD ONC ARIA SESSION SUMMARY
Course Elapsed Days: 14
Plan Fractions Treated to Date: 10
Plan Prescribed Dose Per Fraction: 1.8 Gy
Plan Total Fractions Prescribed: 30
Plan Total Prescribed Dose: 54 Gy
Reference Point Dosage Given to Date: 18 Gy
Reference Point Session Dosage Given: 1.8 Gy
Session Number: 10

## 2023-09-29 NOTE — Progress Notes (Unsigned)
 [facility]   Telephone:(336) [phone] Fax:(336) [phone]    Patient Care Team: Beam, [name], MD as PCP - General (Family Medicine) Ardis, [name], RN as Oncology Nurse Navigator [name] MATSU, NP as Nurse Practitioner (Psychology) [name], MD as Consulting Physician (Hematology and Oncology)   CHIEF COMPLAINT: Follow up anal cancer   Oncology History  Squamous cell carcinoma of anus (HCC)  08/26/2023 Initial Diagnosis   Squamous cell carcinoma of anus (HCC)   09/09/2023 Cancer Staging   Staging form: Anus, AJCC 8th Edition - Clinical: Stage IIIC (cT3, cN1a, cM0) - Signed by [name], MD on 09/09/2023   09/15/2023 -  Chemotherapy   Patient is on Treatment Plan : ANUS Mitomycin  D1,28 + 5FU D1-4, 28-31 q32d        CURRENT THERAPY: Concurrent chemoRT with mitomycin /5FU starting 09/15/23   INTERVAL HISTORY Misty [name] returns for follow up as scheduled. Last seen by me 09/22/23  ROS   Past Medical History:  Diagnosis Date   Anxiety    Bipolar disorder (HCC)    Dr. Vincente   COPD (chronic obstructive pulmonary disease) (HCC) 2006   Depression    Hypothyroidism    Rheumatic fever    as a child, age 78yo   Urinary tract infection    hx/o   Wears glasses      Past Surgical History:  Procedure Laterality Date   ABDOMINAL HYSTERECTOMY     APPENDECTOMY     CESAREAN SECTION     UTERINE FIBROID SURGERY       Outpatient Encounter Medications as of 09/30/2023  Medication Sig Note   acetaminophen  (TYLENOL ) 500 MG tablet Take 1,000 mg by mouth every 4 (four) hours as needed for moderate pain.    albuterol  (VENTOLIN  HFA) 108 (90 Base) MCG/ACT inhaler Inhale 2 puffs into the lungs every 6 (six) hours as needed for wheezing or shortness of breath.    ALBUTEROL  IN Albuterol  inhaler    atorvastatin  (LIPITOR) 20 MG tablet Take 20 mg by mouth daily.  11/27/2014: Received from: External Pharmacy Received Sig: Take 20 mg by mouth daily.   baclofen   (LIORESAL ) 10 MG tablet TAKE 1 TABLET THREE TIMES DAILY    buPROPion  (WELLBUTRIN  XL) 150 MG 24 hr tablet Take 300 mg by mouth every morning.     dexamethasone  (DECADRON ) 4 MG tablet Take 1 tablet by mouth in the morning with meals, for 3-5 days after chemo    diazepam (VALIUM) 5 MG tablet Take 1 tablet by mouth in the morning and at bedtime.    Docusate Sodium 100 MG capsule Take 100 mg by mouth.    fluticasone  (FLONASE) 50 MCG/ACT nasal spray Place 2 sprays into both nostrils.    Fluticasone -Umeclidin-Vilant (TRELEGY ELLIPTA ) 100-62.5-25 MCG/ACT AEPB Inhale 1 puff into the lungs daily.    gabapentin  (NEURONTIN ) 600 MG tablet Take 300-900 mg by mouth 2 (two) times daily. Take 0.5 tablet (300 mg) in the morning and Take 1.5 (900 mg) in the evening    guaiFENesin  (MUCINEX ) 600 MG 12 hr tablet Take 600 mg by mouth 2 (two) times daily as needed for cough.    ipratropium-albuterol  (DUONEB) 0.5-2.5 (3) MG/3ML SOLN Take 3 mLs by nebulization every 4 (four) hours as needed.    lamoTRIgine  (LAMICTAL ) 200 MG tablet Take 200 mg by mouth at bedtime.     levothyroxine  (SYNTHROID , LEVOTHROID) 75 MCG tablet Take 0.75 mcg by mouth daily.    loratadine (CLARITIN)  10 MG tablet Take 10 mg by mouth daily.    LORazepam  (ATIVAN ) 0.5 MG tablet Take 1-2 tablets (0.5-1 mg total) by mouth every 8 (eight) hours as needed for anxiety.    magic mouthwash (nystatin , lidocaine , diphenhydrAMINE, alum & mag hydroxide) suspension Swish and swallow 5 mLs by mouth 3 (three) times daily as needed for mouth pain.    montelukast  (SINGULAIR ) 10 MG tablet Take 1 tablet (10 mg total) by mouth at bedtime.    Multiple Vitamins-Minerals (CENTRUM ADULT PO) Take by mouth.    naltrexone (DEPADE) 50 MG tablet Take 50 mg by mouth.    ondansetron  (ZOFRAN ) 8 MG tablet Take 1 tablet (8 mg total) by mouth every 8 (eight) hours as needed for nausea or vomiting. Do not use for 72 hours following chemotherapy.    polyethylene glycol (MIRALAX) 17 g  packet MiraLax    promethazine  (PHENERGAN ) 25 MG tablet Take 1 tablet (25 mg total) by mouth every 6 (six) hours as needed for nausea or vomiting.    Specialty Vitamins Products (HAIR NOURISHING SUPPLEMENT PO) Nutrafol hair supplement.  4 tabs daily.    valACYclovir (VALTREX) 500 MG tablet Take 500 mg by mouth daily.    zolpidem  (AMBIEN ) 10 MG tablet Take 10 mg by mouth daily.    No facility-administered encounter medications on file as of 09/30/2023.     There were no vitals filed for this visit. There is no height or weight on file to calculate BMI.   ECOG PERFORMANCE STATUS: {CHL ONC ECOG [id number]}  PHYSICAL EXAM GENERAL:alert, no distress and comfortable SKIN: no rash  EYES: sclera clear NECK: without mass LYMPH:  no palpable cervical or supraclavicular lymphadenopathy  LUNGS: clear with normal breathing effort HEART: regular rate & rhythm, no lower extremity edema ABDOMEN: abdomen soft, non-tender and normal bowel sounds NEURO: alert & oriented x 3 with fluent speech, no focal motor/sensory deficits Breast exam:  PAC without erythema    CBC    Latest Ref Rng & Units 09/22/2023    9:58 AM 09/15/2023    9:56 AM 08/26/2023    1:11 PM  CBC  WBC 4.0 - 10.5 K/uL 5.4  7.6  18.6   Hemoglobin 12.0 - 15.0 g/dL 88.9  88.2  87.7   Hematocrit 36.0 - 46.0 % 33.7  36.1  37.3   Platelets 150 - 400 K/uL 224  309  377       CMP     Latest Ref Rng & Units 09/22/2023    9:58 AM 09/15/2023    9:56 AM 08/26/2023    1:11 PM  CMP  Glucose 70 - 99 mg/dL 62  93  97   BUN 8 - 23 mg/dL 16  19  14    Creatinine 0.44 - 1.00 mg/dL 8.91  9.02  8.83   Sodium 135 - 145 mmol/L 140  139  136   Potassium 3.5 - 5.1 mmol/L 3.7  4.4  3.9   Chloride 98 - 111 mmol/L 107  107  102   CO2 22 - 32 mmol/L 27  25  26    Calcium  8.9 - 10.3 mg/dL 9.4  89.9  9.9   Total Protein 6.5 - 8.1 g/dL 6.8  7.5  8.2   Total Bilirubin 0.0 - 1.2 mg/dL 0.4  0.4  0.3   Alkaline Phos 38 - 126 U/L 70  85  102   AST 15 - 41  U/L 15  18  13    ALT 0 -  44 U/L 12  17  9        ASSESSMENT & PLAN: Squamous cell carcinoma of anus, cT3N1M0, stage IIIC -Presented with rectal pain and colonoscopy showed a 3 cm mass in the anus.  Biopsy confirmed squamous cell carcinoma. - Staging PET scan showed intense of hypermetabolic activity extending from the anus into the rectum, and the small bilateral inguinal lymph nodes with low-level activity.  No evidence of distant metastasis. -Started concurrent chemoradiation with mitomycin  and 5-FU on September 15, 2023. - She tolerated the first week poorly with uncontrolled nausea and vomiting, not well-managed at home, required IVF in clinic.  She has developed mucositis.  PLAN:  No orders of the defined types were placed in this encounter.     All questions were answered. The patient knows to call the clinic with any problems, questions or concerns. No barriers to learning were detected. I spent *** counseling the patient face to face. The total time spent in the appointment was *** and more than 50% was on counseling, review of test results, and coordination of care.   Curlie Macken K Dennice Tindol, NP 09/29/2023 8:22 PM

## 2023-09-30 ENCOUNTER — Inpatient Hospital Stay: Payer: Medicare (Managed Care)

## 2023-09-30 ENCOUNTER — Encounter: Payer: Self-pay | Admitting: Nurse Practitioner

## 2023-09-30 ENCOUNTER — Other Ambulatory Visit: Payer: Self-pay

## 2023-09-30 ENCOUNTER — Inpatient Hospital Stay (HOSPITAL_BASED_OUTPATIENT_CLINIC_OR_DEPARTMENT_OTHER): Payer: Medicare (Managed Care) | Admitting: Nurse Practitioner

## 2023-09-30 ENCOUNTER — Ambulatory Visit
Admission: RE | Admit: 2023-09-30 | Discharge: 2023-09-30 | Disposition: A | Discharge status: Home / Self Care | Payer: Medicare (Managed Care) | Source: Ambulatory Visit | Attending: Radiation Oncology | Admitting: Radiation Oncology

## 2023-09-30 VITALS — BP 120/58 | HR 95 | Temp 98.0°F | Resp 17 | Wt 151.2 lb

## 2023-09-30 DIAGNOSIS — E860 Dehydration: Secondary | ICD-10-CM | POA: Diagnosis not present

## 2023-09-30 DIAGNOSIS — Z79624 Long term (current) use of inhibitors of nucleotide synthesis: Secondary | ICD-10-CM | POA: Diagnosis not present

## 2023-09-30 DIAGNOSIS — C210 Malignant neoplasm of anus, unspecified: Secondary | ICD-10-CM | POA: Diagnosis present

## 2023-09-30 DIAGNOSIS — E039 Hypothyroidism, unspecified: Secondary | ICD-10-CM | POA: Diagnosis not present

## 2023-09-30 DIAGNOSIS — Z510 Encounter for antineoplastic radiation therapy: Secondary | ICD-10-CM | POA: Diagnosis not present

## 2023-09-30 DIAGNOSIS — Z79899 Other long term (current) drug therapy: Secondary | ICD-10-CM | POA: Diagnosis not present

## 2023-09-30 DIAGNOSIS — Z5111 Encounter for antineoplastic chemotherapy: Secondary | ICD-10-CM | POA: Diagnosis present

## 2023-09-30 DIAGNOSIS — D649 Anemia, unspecified: Secondary | ICD-10-CM | POA: Diagnosis not present

## 2023-09-30 LAB — CBC WITH DIFFERENTIAL (CANCER CENTER ONLY)
Abs Immature Granulocytes: 0.01 K/uL (ref 0.00–0.07)
Basophils Absolute: 0 K/uL (ref 0.0–0.1)
Basophils Relative: 0 %
Eosinophils Absolute: 0.7 K/uL — ABNORMAL HIGH (ref 0.0–0.5)
Eosinophils Relative: 13 %
HCT: 32.2 % — ABNORMAL LOW (ref 36.0–46.0)
Hemoglobin: 10.6 g/dL — ABNORMAL LOW (ref 12.0–15.0)
Immature Granulocytes: 0 %
Lymphocytes Relative: 5 %
Lymphs Abs: 0.2 K/uL — ABNORMAL LOW (ref 0.7–4.0)
MCH: 30.5 pg (ref 26.0–34.0)
MCHC: 32.9 g/dL (ref 30.0–36.0)
MCV: 92.5 fL (ref 80.0–100.0)
Monocytes Absolute: 0.7 K/uL (ref 0.1–1.0)
Monocytes Relative: 14 %
Neutro Abs: 3.4 K/uL (ref 1.7–7.7)
Neutrophils Relative %: 68 %
Platelet Count: 89 K/uL — ABNORMAL LOW (ref 150–400)
RBC: 3.48 MIL/uL — ABNORMAL LOW (ref 3.87–5.11)
RDW: 13.7 % (ref 11.5–15.5)
WBC Count: 5 K/uL (ref 4.0–10.5)
nRBC: 0 % (ref 0.0–0.2)

## 2023-09-30 LAB — CMP (CANCER CENTER ONLY)
ALT: 32 U/L (ref 0–44)
AST: 22 U/L (ref 15–41)
Albumin: 3.8 g/dL (ref 3.5–5.0)
Alkaline Phosphatase: 92 U/L (ref 38–126)
Anion gap: 5 (ref 5–15)
BUN: 18 mg/dL (ref 8–23)
CO2: 26 mmol/L (ref 22–32)
Calcium: 9.3 mg/dL (ref 8.9–10.3)
Chloride: 106 mmol/L (ref 98–111)
Creatinine: 1.14 mg/dL — ABNORMAL HIGH (ref 0.44–1.00)
GFR, Estimated: 53 mL/min — ABNORMAL LOW (ref >60)
Glucose, Bld: 107 mg/dL — ABNORMAL HIGH (ref 70–99)
Potassium: 4.3 mmol/L (ref 3.5–5.1)
Sodium: 137 mmol/L (ref 135–145)
Total Bilirubin: 0.4 mg/dL (ref 0.0–1.2)
Total Protein: 6.7 g/dL (ref 6.5–8.1)

## 2023-09-30 LAB — RAD ONC ARIA SESSION SUMMARY
Course Elapsed Days: 15
Plan Fractions Treated to Date: 11
Plan Prescribed Dose Per Fraction: 1.8 Gy
Plan Total Fractions Prescribed: 30
Plan Total Prescribed Dose: 54 Gy
Reference Point Dosage Given to Date: 19.8 Gy
Reference Point Session Dosage Given: 1.8 Gy
Session Number: 11

## 2023-10-01 ENCOUNTER — Ambulatory Visit
Admission: RE | Admit: 2023-10-01 | Discharge: 2023-10-01 | Disposition: A | Discharge status: Home / Self Care | Payer: Medicare (Managed Care) | Source: Ambulatory Visit | Attending: Radiation Oncology | Admitting: Radiation Oncology

## 2023-10-01 ENCOUNTER — Other Ambulatory Visit: Payer: Self-pay

## 2023-10-01 DIAGNOSIS — Z510 Encounter for antineoplastic radiation therapy: Secondary | ICD-10-CM | POA: Diagnosis not present

## 2023-10-01 LAB — RAD ONC ARIA SESSION SUMMARY
Course Elapsed Days: 16
Plan Fractions Treated to Date: 12
Plan Prescribed Dose Per Fraction: 1.8 Gy
Plan Total Fractions Prescribed: 30
Plan Total Prescribed Dose: 54 Gy
Reference Point Dosage Given to Date: 21.6 Gy
Reference Point Session Dosage Given: 1.8 Gy
Session Number: 12

## 2023-10-02 ENCOUNTER — Inpatient Hospital Stay: Payer: Medicare (Managed Care) | Admitting: Licensed Clinical Social Worker

## 2023-10-02 ENCOUNTER — Other Ambulatory Visit: Payer: Self-pay

## 2023-10-02 ENCOUNTER — Ambulatory Visit
Admission: RE | Admit: 2023-10-02 | Discharge: 2023-10-02 | Disposition: A | Discharge status: Home / Self Care | Payer: Medicare (Managed Care) | Source: Ambulatory Visit | Attending: Radiation Oncology | Admitting: Radiation Oncology

## 2023-10-02 DIAGNOSIS — Z510 Encounter for antineoplastic radiation therapy: Secondary | ICD-10-CM | POA: Diagnosis not present

## 2023-10-02 DIAGNOSIS — C210 Malignant neoplasm of anus, unspecified: Secondary | ICD-10-CM

## 2023-10-02 LAB — RAD ONC ARIA SESSION SUMMARY
Course Elapsed Days: 17
Plan Fractions Treated to Date: 13
Plan Prescribed Dose Per Fraction: 1.8 Gy
Plan Total Fractions Prescribed: 30
Plan Total Prescribed Dose: 54 Gy
Reference Point Dosage Given to Date: 23.4 Gy
Reference Point Session Dosage Given: 1.8 Gy
Session Number: 13

## 2023-10-02 NOTE — Progress Notes (Signed)
 [facility] CSW Progress Note  Visual merchandiser met with patient to follow-up on financial concerns.    Interventions: CSW received the check from AP from the [employer] for pt's rent.  Check given to pt.     Misty JONELLE Manna, LCSW Clinical Social Worker [facility]

## 2023-10-03 ENCOUNTER — Ambulatory Visit
Admission: RE | Admit: 2023-10-03 | Discharge: 2023-10-03 | Disposition: A | Discharge status: Home / Self Care | Payer: Medicare (Managed Care) | Source: Ambulatory Visit | Attending: Radiation Oncology

## 2023-10-03 ENCOUNTER — Other Ambulatory Visit: Payer: Self-pay | Admitting: Radiation Oncology

## 2023-10-03 ENCOUNTER — Ambulatory Visit
Admission: RE | Admit: 2023-10-03 | Discharge: 2023-10-03 | Disposition: A | Discharge status: Home / Self Care | Payer: Medicare (Managed Care) | Source: Ambulatory Visit | Attending: Radiation Oncology | Admitting: Radiation Oncology

## 2023-10-03 ENCOUNTER — Other Ambulatory Visit: Payer: Self-pay

## 2023-10-03 DIAGNOSIS — Z510 Encounter for antineoplastic radiation therapy: Secondary | ICD-10-CM | POA: Diagnosis not present

## 2023-10-03 LAB — RAD ONC ARIA SESSION SUMMARY
Course Elapsed Days: 18
Plan Fractions Treated to Date: 14
Plan Prescribed Dose Per Fraction: 1.8 Gy
Plan Total Fractions Prescribed: 30
Plan Total Prescribed Dose: 54 Gy
Reference Point Dosage Given to Date: 25.2 Gy
Reference Point Session Dosage Given: 1.8 Gy
Session Number: 14

## 2023-10-03 MED ORDER — HYDROMORPHONE HCL 2 MG PO TABS
2.0000 mg | ORAL_TABLET | ORAL | 0 refills | Status: DC | PRN
Start: 2023-10-03 — End: 2023-11-03

## 2023-10-05 NOTE — Progress Notes (Unsigned)
 [facility]   Telephone:(336) [phone] Fax:([phone]    Patient Care Team: Beam, [name], MD as PCP - General (Family Medicine) Ardis, [name], RN as Oncology Nurse Navigator [name] MATSU, NP as Nurse Practitioner (Psychology) [name], MD as Consulting Physician (Hematology and Oncology)   CHIEF COMPLAINT: Follow up anal cancer   Oncology History  Squamous cell carcinoma of anus (HCC)  08/26/2023 Initial Diagnosis   Squamous cell carcinoma of anus (HCC)   09/09/2023 Cancer Staging   Staging form: Anus, AJCC 8th Edition - Clinical: Stage IIIC (cT3, cN1a, cM0) - Signed by [name], MD on 09/09/2023   09/15/2023 -  Chemotherapy   Patient is on Treatment Plan : ANUS Mitomycin  D1,28 + 5FU D1-4, 28-31 q32d        CURRENT THERAPY: Concurrent chemoRT with 5FU/mitomycin   INTERVAL HISTORY Ms. Trafton returns for follow up as scheduled beginning week 4 chemoRT.  Doing well overall.  She is eating and drinking, takes Zofran  prophylactically.  Mucositis resolved.  Managing mild constipation with MiraLAX.  She has rectal itching and discomfort managed with Tylenol , although Dr. Dewey did give her Dilaudid  she has not required yet.  Getting anxious for chemo next week.  ROS  All other systems reviewed and negative  Past Medical History:  Diagnosis Date   Anxiety    Bipolar disorder (HCC)    Dr. Vincente   COPD (chronic obstructive pulmonary disease) (HCC) 2006   Depression    Hypothyroidism    Rheumatic fever    as a child, age 41yo   Urinary tract infection    hx/o   Wears glasses      Past Surgical History:  Procedure Laterality Date   ABDOMINAL HYSTERECTOMY     APPENDECTOMY     CESAREAN SECTION     UTERINE FIBROID SURGERY       Outpatient Encounter Medications as of 10/06/2023  Medication Sig Note   acetaminophen  (TYLENOL ) 500 MG tablet Take 1,000 mg by mouth every 4 (four) hours as needed for moderate pain.    albuterol  (VENTOLIN   HFA) 108 (90 Base) MCG/ACT inhaler Inhale 2 puffs into the lungs every 6 (six) hours as needed for wheezing or shortness of breath.    ALBUTEROL  IN Albuterol  inhaler    atorvastatin  (LIPITOR) 20 MG tablet Take 20 mg by mouth daily.  11/27/2014: Received from: External Pharmacy Received Sig: Take 20 mg by mouth daily.   baclofen  (LIORESAL ) 10 MG tablet TAKE 1 TABLET THREE TIMES DAILY    buPROPion  (WELLBUTRIN  XL) 150 MG 24 hr tablet Take 300 mg by mouth every morning.     dexamethasone  (DECADRON ) 4 MG tablet Take 1 tablet by mouth in the morning with meals, for 3-5 days after chemo    diazepam (VALIUM) 5 MG tablet Take 1 tablet by mouth in the morning and at bedtime.    Docusate Sodium 100 MG capsule Take 100 mg by mouth.    fluticasone  (FLONASE) 50 MCG/ACT nasal spray Place 2 sprays into both nostrils.    Fluticasone -Umeclidin-Vilant (TRELEGY ELLIPTA ) 100-62.5-25 MCG/ACT AEPB Inhale 1 puff into the lungs daily.    gabapentin  (NEURONTIN ) 600 MG tablet Take 300-900 mg by mouth 2 (two) times daily. Take 0.5 tablet (300 mg) in the morning and Take 1.5 (900 mg) in the evening    guaiFENesin  (MUCINEX ) 600 MG 12 hr tablet Take 600 mg by mouth 2 (two) times daily as needed for cough.  HYDROmorphone  (DILAUDID ) 2 MG tablet Take 1 tablet (2 mg total) by mouth every 4 (four) hours as needed for severe pain (pain score 7-10).    ipratropium-albuterol  (DUONEB) 0.5-2.5 (3) MG/3ML SOLN Take 3 mLs by nebulization every 4 (four) hours as needed.    lamoTRIgine  (LAMICTAL ) 200 MG tablet Take 200 mg by mouth at bedtime.     levothyroxine  (SYNTHROID , LEVOTHROID) 75 MCG tablet Take 0.75 mcg by mouth daily.    loratadine (CLARITIN) 10 MG tablet Take 10 mg by mouth daily.    LORazepam  (ATIVAN ) 0.5 MG tablet Take 1-2 tablets (0.5-1 mg total) by mouth every 8 (eight) hours as needed for anxiety.    magic mouthwash (nystatin , lidocaine , diphenhydrAMINE, alum & mag hydroxide) suspension Swish and swallow 5 mLs by mouth 3  (three) times daily as needed for mouth pain.    montelukast  (SINGULAIR ) 10 MG tablet Take 1 tablet (10 mg total) by mouth at bedtime.    Multiple Vitamins-Minerals (CENTRUM ADULT PO) Take by mouth.    naltrexone (DEPADE) 50 MG tablet Take 50 mg by mouth.    ondansetron  (ZOFRAN ) 8 MG tablet Take 1 tablet (8 mg total) by mouth every 8 (eight) hours as needed for nausea or vomiting. Do not use for 72 hours following chemotherapy.    polyethylene glycol (MIRALAX) 17 g packet MiraLax    promethazine  (PHENERGAN ) 25 MG tablet Take 1 tablet (25 mg total) by mouth every 6 (six) hours as needed for nausea or vomiting.    Specialty Vitamins Products (HAIR NOURISHING SUPPLEMENT PO) Nutrafol hair supplement.  4 tabs daily.    valACYclovir (VALTREX) 500 MG tablet Take 500 mg by mouth daily.    zolpidem  (AMBIEN ) 10 MG tablet Take 10 mg by mouth daily.    [DISCONTINUED] ondansetron  (ZOFRAN ) 8 MG tablet Take 1 tablet (8 mg total) by mouth every 8 (eight) hours as needed for nausea or vomiting. Do not use for 72 hours following chemotherapy.    No facility-administered encounter medications on file as of 10/06/2023.     Today's Vitals   10/06/23 1015  BP: 136/64  Pulse: 87  Resp: 17  Temp: 98.3 F (36.8 C)  SpO2: 98%  Weight: 152 lb 8 oz (69.2 kg)   Body mass index is 27.01 kg/m.   ECOG PERFORMANCE STATUS: 1 - Symptomatic but completely ambulatory  PHYSICAL EXAM GENERAL:alert, no distress and comfortable SKIN: no rash  EYES: sclera clear LUNGS:  normal breathing effort NEURO: alert & oriented x 3 with fluent speech   CBC    Latest Ref Rng & Units 10/06/2023    9:46 AM 09/30/2023   10:35 AM 09/22/2023    9:58 AM  CBC  WBC 4.0 - 10.5 K/uL 4.4  5.0  5.4   Hemoglobin 12.0 - 15.0 g/dL 89.3  89.3  88.9   Hematocrit 36.0 - 46.0 % 32.3  32.2  33.7   Platelets 150 - 400 K/uL 191  89  224       CMP     Latest Ref Rng & Units 10/06/2023    9:46 AM 09/30/2023   10:35 AM 09/22/2023    9:58 AM  CMP   Glucose 70 - 99 mg/dL 93  892  62   BUN 8 - 23 mg/dL 16  18  16    Creatinine 0.44 - 1.00 mg/dL 8.85  8.85  8.91   Sodium 135 - 145 mmol/L 138  137  140   Potassium 3.5 - 5.1 mmol/L 4.2  4.3  3.7   Chloride 98 - 111 mmol/L 105  106  107   CO2 22 - 32 mmol/L 27  26  27    Calcium  8.9 - 10.3 mg/dL 9.6  9.3  9.4   Total Protein 6.5 - 8.1 g/dL 6.9  6.7  6.8   Total Bilirubin 0.0 - 1.2 mg/dL 0.3  0.4  0.4   Alkaline Phos 38 - 126 U/L 88  92  70   AST 15 - 41 U/L 20  22  15    ALT 0 - 44 U/L 21  32  12       ASSESSMENT & PLAN:68 year old female    Squamous cell carcinoma of anus, cT3N1M0, stage IIIC -Presented with rectal pain and colonoscopy showed a 3 cm mass in the anus.  Biopsy confirmed squamous cell carcinoma. - Staging PET scan showed intense of hypermetabolic activity extending from the anus into the rectum, and the small bilateral inguinal lymph nodes with low-level activity.  No evidence of distant metastasis. -Started concurrent chemoradiation with mitomycin  and 5-FU on September 15, 2023. - She tolerated the first week poorly with mucositis and uncontrolled nausea and vomiting, not well-managed at home, required IVF in clinic. SEs improved by week 2 -3 with aggressive supportive care -Ms. Suniga appears well, tolerating chemoRT with mild constipation and local effects from radiation. She continues aggressive supportive care. We discussed the regimen for chemo next week.  -Switch valium back to ativan , alternate zofran  and phenergan , add decadron , and continue holding ambien . She will receive Emend and Aloxi on D28 and will add IVF on Wed/Fri -Reviewed pain management and to avoid opioids with benzos, no driving, no alcohol etc -Labs reviewed, she has recovered for chemo next week -F/up next week       PLAN: Labs reviewed Continue chemoRT and supportive care PICC placement, lab, f/up and D28 chemo 7/21 IVF 7/23 and 7/25 with pump/picc d/c   All questions were answered. The  patient knows to call the clinic with any problems, questions or concerns. No barriers to learning were detected. I spent 20 minutes counseling the patient face to face. The total time spent in the appointment was 30 minutes and more than 50% was on counseling, review of test results, and coordination of care.   Amyra Vantuyl K Sabiha Sura, NP 10/06/2023

## 2023-10-06 ENCOUNTER — Ambulatory Visit
Admission: RE | Admit: 2023-10-06 | Discharge: 2023-10-06 | Disposition: A | Discharge status: Home / Self Care | Payer: Medicare (Managed Care) | Source: Ambulatory Visit | Attending: Radiation Oncology | Admitting: Radiation Oncology

## 2023-10-06 ENCOUNTER — Inpatient Hospital Stay: Payer: Medicare (Managed Care)

## 2023-10-06 ENCOUNTER — Inpatient Hospital Stay (HOSPITAL_BASED_OUTPATIENT_CLINIC_OR_DEPARTMENT_OTHER): Payer: Medicare (Managed Care) | Admitting: Nurse Practitioner

## 2023-10-06 ENCOUNTER — Encounter: Payer: Self-pay | Admitting: Nurse Practitioner

## 2023-10-06 ENCOUNTER — Other Ambulatory Visit: Payer: Self-pay

## 2023-10-06 VITALS — BP 136/64 | HR 87 | Temp 98.3°F | Resp 17 | Wt 152.5 lb

## 2023-10-06 DIAGNOSIS — C210 Malignant neoplasm of anus, unspecified: Secondary | ICD-10-CM

## 2023-10-06 DIAGNOSIS — Z510 Encounter for antineoplastic radiation therapy: Secondary | ICD-10-CM | POA: Diagnosis not present

## 2023-10-06 DIAGNOSIS — Z5111 Encounter for antineoplastic chemotherapy: Secondary | ICD-10-CM | POA: Diagnosis not present

## 2023-10-06 LAB — CBC WITH DIFFERENTIAL (CANCER CENTER ONLY)
Abs Immature Granulocytes: 0.21 K/uL — ABNORMAL HIGH (ref 0.00–0.07)
Basophils Absolute: 0 K/uL (ref 0.0–0.1)
Basophils Relative: 1 %
Eosinophils Absolute: 0.3 K/uL (ref 0.0–0.5)
Eosinophils Relative: 7 %
HCT: 32.3 % — ABNORMAL LOW (ref 36.0–46.0)
Hemoglobin: 10.6 g/dL — ABNORMAL LOW (ref 12.0–15.0)
Immature Granulocytes: 5 %
Lymphocytes Relative: 4 %
Lymphs Abs: 0.2 K/uL — ABNORMAL LOW (ref 0.7–4.0)
MCH: 30.7 pg (ref 26.0–34.0)
MCHC: 32.8 g/dL (ref 30.0–36.0)
MCV: 93.6 fL (ref 80.0–100.0)
Monocytes Absolute: 0.5 K/uL (ref 0.1–1.0)
Monocytes Relative: 12 %
Neutro Abs: 3.2 K/uL (ref 1.7–7.7)
Neutrophils Relative %: 71 %
Platelet Count: 191 K/uL (ref 150–400)
RBC: 3.45 MIL/uL — ABNORMAL LOW (ref 3.87–5.11)
RDW: 14.6 % (ref 11.5–15.5)
WBC Count: 4.4 K/uL (ref 4.0–10.5)
nRBC: 0 % (ref 0.0–0.2)

## 2023-10-06 LAB — RAD ONC ARIA SESSION SUMMARY
Course Elapsed Days: 21
Plan Fractions Treated to Date: 15
Plan Prescribed Dose Per Fraction: 1.8 Gy
Plan Total Fractions Prescribed: 30
Plan Total Prescribed Dose: 54 Gy
Reference Point Dosage Given to Date: 27 Gy
Reference Point Session Dosage Given: 1.8 Gy
Session Number: 15

## 2023-10-06 LAB — CMP (CANCER CENTER ONLY)
ALT: 21 U/L (ref 0–44)
AST: 20 U/L (ref 15–41)
Albumin: 3.8 g/dL (ref 3.5–5.0)
Alkaline Phosphatase: 88 U/L (ref 38–126)
Anion gap: 6 (ref 5–15)
BUN: 16 mg/dL (ref 8–23)
CO2: 27 mmol/L (ref 22–32)
Calcium: 9.6 mg/dL (ref 8.9–10.3)
Chloride: 105 mmol/L (ref 98–111)
Creatinine: 1.14 mg/dL — ABNORMAL HIGH (ref 0.44–1.00)
GFR, Estimated: 53 mL/min — ABNORMAL LOW (ref >60)
Glucose, Bld: 93 mg/dL (ref 70–99)
Potassium: 4.2 mmol/L (ref 3.5–5.1)
Sodium: 138 mmol/L (ref 135–145)
Total Bilirubin: 0.3 mg/dL (ref 0.0–1.2)
Total Protein: 6.9 g/dL (ref 6.5–8.1)

## 2023-10-06 MED ORDER — ONDANSETRON HCL 8 MG PO TABS: 8.0000 mg | ORAL_TABLET | Freq: Three times a day (TID) | ORAL | 1 refills | Status: DC | PRN

## 2023-10-07 ENCOUNTER — Ambulatory Visit
Admission: RE | Admit: 2023-10-07 | Discharge: 2023-10-07 | Disposition: A | Discharge status: Home / Self Care | Payer: Medicare (Managed Care) | Source: Ambulatory Visit | Attending: Radiation Oncology | Admitting: Radiation Oncology

## 2023-10-07 ENCOUNTER — Other Ambulatory Visit: Payer: Self-pay

## 2023-10-07 DIAGNOSIS — Z510 Encounter for antineoplastic radiation therapy: Secondary | ICD-10-CM | POA: Diagnosis not present

## 2023-10-07 LAB — RAD ONC ARIA SESSION SUMMARY
Course Elapsed Days: 22
Plan Fractions Treated to Date: 16
Plan Prescribed Dose Per Fraction: 1.8 Gy
Plan Total Fractions Prescribed: 30
Plan Total Prescribed Dose: 54 Gy
Reference Point Dosage Given to Date: 28.8 Gy
Reference Point Session Dosage Given: 1.8 Gy
Session Number: 16

## 2023-10-08 ENCOUNTER — Other Ambulatory Visit: Payer: Self-pay

## 2023-10-08 ENCOUNTER — Ambulatory Visit
Admission: RE | Admit: 2023-10-08 | Discharge: 2023-10-08 | Disposition: A | Discharge status: Home / Self Care | Payer: Medicare (Managed Care) | Source: Ambulatory Visit | Attending: Radiation Oncology | Admitting: Radiation Oncology

## 2023-10-08 DIAGNOSIS — Z510 Encounter for antineoplastic radiation therapy: Secondary | ICD-10-CM | POA: Diagnosis not present

## 2023-10-08 LAB — RAD ONC ARIA SESSION SUMMARY
Course Elapsed Days: 23
Plan Fractions Treated to Date: 17
Plan Prescribed Dose Per Fraction: 1.8 Gy
Plan Total Fractions Prescribed: 30
Plan Total Prescribed Dose: 54 Gy
Reference Point Dosage Given to Date: 30.6 Gy
Reference Point Session Dosage Given: 1.8 Gy
Session Number: 17

## 2023-10-09 ENCOUNTER — Other Ambulatory Visit: Payer: Self-pay

## 2023-10-09 ENCOUNTER — Ambulatory Visit
Admission: RE | Admit: 2023-10-09 | Discharge: 2023-10-09 | Disposition: A | Discharge status: Home / Self Care | Payer: Medicare (Managed Care) | Source: Ambulatory Visit | Attending: Radiation Oncology | Admitting: Radiation Oncology

## 2023-10-09 DIAGNOSIS — Z510 Encounter for antineoplastic radiation therapy: Secondary | ICD-10-CM | POA: Diagnosis not present

## 2023-10-09 LAB — RAD ONC ARIA SESSION SUMMARY
Course Elapsed Days: 24
Plan Fractions Treated to Date: 18
Plan Prescribed Dose Per Fraction: 1.8 Gy
Plan Total Fractions Prescribed: 30
Plan Total Prescribed Dose: 54 Gy
Reference Point Dosage Given to Date: 32.4 Gy
Reference Point Session Dosage Given: 1.8 Gy
Session Number: 18

## 2023-10-10 ENCOUNTER — Other Ambulatory Visit: Payer: Self-pay

## 2023-10-10 ENCOUNTER — Ambulatory Visit
Admission: RE | Admit: 2023-10-10 | Discharge: 2023-10-10 | Disposition: A | Discharge status: Home / Self Care | Payer: Medicare (Managed Care) | Source: Ambulatory Visit | Attending: Radiation Oncology | Admitting: Radiation Oncology

## 2023-10-10 DIAGNOSIS — Z510 Encounter for antineoplastic radiation therapy: Secondary | ICD-10-CM | POA: Diagnosis not present

## 2023-10-10 LAB — RAD ONC ARIA SESSION SUMMARY
Course Elapsed Days: 25
Plan Fractions Treated to Date: 19
Plan Prescribed Dose Per Fraction: 1.8 Gy
Plan Total Fractions Prescribed: 30
Plan Total Prescribed Dose: 54 Gy
Reference Point Dosage Given to Date: 34.2 Gy
Reference Point Session Dosage Given: 1.8 Gy
Session Number: 19

## 2023-10-10 MED FILL — Fosaprepitant Dimeglumine For IV Infusion 150 MG (Base Eq): INTRAVENOUS | Qty: 5 | Status: AC

## 2023-10-12 NOTE — Assessment & Plan Note (Signed)
 cT3N1M0, stage IIIC -Presented with rectal pain and colonoscopy showed a 3 cm mass in the anus.  Biopsy confirmed squamous cell carcinoma. - Staging PET scan showed intense of hypermetabolic activity extending from the anus into the rectum, and the small bilateral inguinal lymph nodes with low-level activity.  No evidence of distant metastasis. -She will start concurrent chemoradiation with mitomycin and 5-FU on September 15, 2023.

## 2023-10-13 ENCOUNTER — Other Ambulatory Visit: Payer: Self-pay

## 2023-10-13 ENCOUNTER — Ambulatory Visit
Admission: RE | Admit: 2023-10-13 | Discharge: 2023-10-13 | Disposition: A | Discharge status: Home / Self Care | Payer: Medicare (Managed Care) | Source: Ambulatory Visit | Attending: Radiation Oncology | Admitting: Radiation Oncology

## 2023-10-13 ENCOUNTER — Inpatient Hospital Stay: Payer: Medicare (Managed Care)

## 2023-10-13 ENCOUNTER — Other Ambulatory Visit: Payer: Self-pay | Admitting: Hematology

## 2023-10-13 ENCOUNTER — Inpatient Hospital Stay (HOSPITAL_BASED_OUTPATIENT_CLINIC_OR_DEPARTMENT_OTHER): Payer: Medicare (Managed Care) | Admitting: Hematology

## 2023-10-13 ENCOUNTER — Ambulatory Visit (HOSPITAL_COMMUNITY)
Admission: RE | Admit: 2023-10-13 | Discharge: 2023-10-13 | Disposition: A | Discharge status: Home / Self Care | Payer: Medicare (Managed Care) | Source: Ambulatory Visit | Attending: Hematology

## 2023-10-13 VITALS — BP 112/58 | HR 94 | Temp 97.9°F | Resp 16 | Ht 63.0 in | Wt 154.5 lb

## 2023-10-13 DIAGNOSIS — E860 Dehydration: Secondary | ICD-10-CM

## 2023-10-13 DIAGNOSIS — C210 Malignant neoplasm of anus, unspecified: Secondary | ICD-10-CM | POA: Diagnosis present

## 2023-10-13 DIAGNOSIS — R112 Nausea with vomiting, unspecified: Secondary | ICD-10-CM | POA: Diagnosis not present

## 2023-10-13 DIAGNOSIS — Z95828 Presence of other vascular implants and grafts: Secondary | ICD-10-CM

## 2023-10-13 DIAGNOSIS — Z5111 Encounter for antineoplastic chemotherapy: Secondary | ICD-10-CM | POA: Diagnosis not present

## 2023-10-13 DIAGNOSIS — Z510 Encounter for antineoplastic radiation therapy: Secondary | ICD-10-CM | POA: Diagnosis not present

## 2023-10-13 LAB — RAD ONC ARIA SESSION SUMMARY
Course Elapsed Days: 28
Plan Fractions Treated to Date: 20
Plan Prescribed Dose Per Fraction: 1.8 Gy
Plan Total Fractions Prescribed: 30
Plan Total Prescribed Dose: 54 Gy
Reference Point Dosage Given to Date: 36 Gy
Reference Point Session Dosage Given: 1.8 Gy
Session Number: 20

## 2023-10-13 LAB — CBC WITH DIFFERENTIAL (CANCER CENTER ONLY)
Abs Immature Granulocytes: 0.05 K/uL (ref 0.00–0.07)
Basophils Absolute: 0 K/uL (ref 0.0–0.1)
Basophils Relative: 0 %
Eosinophils Absolute: 0.4 K/uL (ref 0.0–0.5)
Eosinophils Relative: 5 %
HCT: 29.5 % — ABNORMAL LOW (ref 36.0–46.0)
Hemoglobin: 9.7 g/dL — ABNORMAL LOW (ref 12.0–15.0)
Immature Granulocytes: 1 %
Lymphocytes Relative: 4 %
Lymphs Abs: 0.2 K/uL — ABNORMAL LOW (ref 0.7–4.0)
MCH: 30.5 pg (ref 26.0–34.0)
MCHC: 32.9 g/dL (ref 30.0–36.0)
MCV: 92.8 fL (ref 80.0–100.0)
Monocytes Absolute: 0.6 K/uL (ref 0.1–1.0)
Monocytes Relative: 9 %
Neutro Abs: 5.6 K/uL (ref 1.7–7.7)
Neutrophils Relative %: 81 %
Platelet Count: 258 K/uL (ref 150–400)
RBC: 3.18 MIL/uL — ABNORMAL LOW (ref 3.87–5.11)
RDW: 15.6 % — ABNORMAL HIGH (ref 11.5–15.5)
WBC Count: 6.8 K/uL (ref 4.0–10.5)
nRBC: 0 % (ref 0.0–0.2)

## 2023-10-13 LAB — CMP (CANCER CENTER ONLY)
ALT: 23 U/L (ref 0–44)
AST: 19 U/L (ref 15–41)
Albumin: 3.8 g/dL (ref 3.5–5.0)
Alkaline Phosphatase: 96 U/L (ref 38–126)
Anion gap: 6 (ref 5–15)
BUN: 18 mg/dL (ref 8–23)
CO2: 26 mmol/L (ref 22–32)
Calcium: 9.3 mg/dL (ref 8.9–10.3)
Chloride: 104 mmol/L (ref 98–111)
Creatinine: 0.9 mg/dL (ref 0.44–1.00)
GFR, Estimated: >60 mL/min (ref >60)
Glucose, Bld: 121 mg/dL — ABNORMAL HIGH (ref 70–99)
Potassium: 4.2 mmol/L (ref 3.5–5.1)
Sodium: 136 mmol/L (ref 135–145)
Total Bilirubin: 0.2 mg/dL (ref 0.0–1.2)
Total Protein: 6.8 g/dL (ref 6.5–8.1)

## 2023-10-13 MED ORDER — SODIUM CHLORIDE 0.9 % IV SOLN
INTRAVENOUS | Status: DC
Start: 2023-10-13 — End: 2023-10-13

## 2023-10-13 MED ORDER — MITOMYCIN CHEMO IV INJECTION 20 MG
10.0000 mg/m2 | Freq: Once | INTRAVENOUS | Status: AC
  Administered 2023-10-13: 18 mg via INTRAVENOUS
  Filled 2023-10-13: qty 36

## 2023-10-13 MED ORDER — SODIUM CHLORIDE 0.9 % IV SOLN
1000.0000 mg/m2/d | INTRAVENOUS | Status: DC
  Administered 2023-10-13: 7000 mg via INTRAVENOUS
  Filled 2023-10-13: qty 140

## 2023-10-13 MED ORDER — LIDOCAINE HCL 1 % IJ SOLN
INTRAMUSCULAR | Status: AC
  Filled 2023-10-13: qty 20

## 2023-10-13 MED ORDER — SODIUM CHLORIDE 0.9 % IV SOLN
150.0000 mg | Freq: Once | INTRAVENOUS | Status: AC
  Administered 2023-10-13: 150 mg via INTRAVENOUS
  Filled 2023-10-13: qty 150

## 2023-10-13 MED ORDER — DEXAMETHASONE SODIUM PHOSPHATE 10 MG/ML IJ SOLN
10.0000 mg | Freq: Once | INTRAMUSCULAR | Status: AC
  Administered 2023-10-13: 10 mg via INTRAVENOUS
  Filled 2023-10-13: qty 1

## 2023-10-13 MED ORDER — PROMETHAZINE HCL 25 MG PO TABS: 25.0000 mg | ORAL_TABLET | Freq: Four times a day (QID) | ORAL | 1 refills | Status: DC | PRN

## 2023-10-13 MED ORDER — SODIUM CHLORIDE 0.9% FLUSH
10.0000 mL | Freq: Once | INTRAVENOUS | Status: AC
Start: 2023-10-13 — End: 2023-10-13
  Administered 2023-10-13: 10 mL

## 2023-10-13 MED ORDER — LORAZEPAM 0.5 MG PO TABS: 0.5000 mg | ORAL_TABLET | Freq: Three times a day (TID) | ORAL | 0 refills | Status: DC | PRN

## 2023-10-13 MED ORDER — PALONOSETRON HCL INJECTION 0.25 MG/5ML
0.2500 mg | Freq: Once | INTRAVENOUS | Status: AC
  Administered 2023-10-13: 0.25 mg via INTRAVENOUS
  Filled 2023-10-13: qty 5

## 2023-10-13 NOTE — Progress Notes (Signed)
 Nutrition Follow-up:  Patient with stage III SCC of anus.  Patient is receiving concurrent chemoradiation with mitomycin .  Patient is followed by Dr Lanny and Dewey.  Met with patient and daughter during infusion.  Reports that her appetite has been good for the last few weeks. She has not had diarrhea for 4 weeks except last night and took imodium.  Has not tried banatrol yet.  Has ben eating sandwichs ([place] and cheese, tuna salad, egg, peanut butter), snacks on cheese, crackers and olives.  Has ice cream, shakes and yogurt. Drinks Fairlife milk or almond milk.  Has a metallic taste in her mouth.     Medications: reviewed  Labs: reviewed  Anthropometrics:   Weight 154 lb 8 oz today  153 lb (6/24)  NUTRITION DIAGNOSIS: Food and nutrition related knowledge deficit improved   INTERVENTION:  Continue oral nutrition supplements Continue good sources of protein and small frequent meals Contact information provided and patient will contact RD if has further questions or concerns     NEXT VISIT: no follow-up RD available if needed  Mahina Salatino B. Dasie SOLON, CSO, LDN Registered Dietitian [phone]

## 2023-10-13 NOTE — Patient Instructions (Signed)
 [facility] - A DEPT OF [place]  Discharge Instructions: Thank you for choosing [facility] to provide your oncology and hematology care.   If you have a lab appointment with the Cancer Center, please go directly to the Cancer Center and check in at the registration area.   Wear comfortable clothing and clothing appropriate for easy access to any Portacath or PICC line.   We strive to give you quality time with your provider. You may need to reschedule your appointment if you arrive late (15 or more minutes).  Arriving late affects you and other patients whose appointments are after yours.  Also, if you miss three or more appointments without notifying the office, you may be dismissed from the clinic at the provider's discretion.      For prescription refill requests, have your pharmacy contact our office and allow 72 hours for refills to be completed.    Today you received the following chemotherapy and/or immunotherapy agents mitomycin and adrucil       To help prevent nausea and vomiting after your treatment, we encourage you to take your nausea medication as directed.  BELOW ARE SYMPTOMS THAT SHOULD BE REPORTED IMMEDIATELY: *FEVER GREATER THAN 100.4 F (38 C) OR HIGHER *CHILLS OR SWEATING *NAUSEA AND VOMITING THAT IS NOT CONTROLLED WITH YOUR NAUSEA MEDICATION *UNUSUAL SHORTNESS OF BREATH *UNUSUAL BRUISING OR BLEEDING *URINARY PROBLEMS (pain or burning when urinating, or frequent urination) *BOWEL PROBLEMS (unusual diarrhea, constipation, pain near the anus) TENDERNESS IN MOUTH AND THROAT WITH OR WITHOUT PRESENCE OF ULCERS (sore throat, sores in mouth, or a toothache) UNUSUAL RASH, SWELLING OR PAIN  UNUSUAL VAGINAL DISCHARGE OR ITCHING   Items with * indicate a potential emergency and should be followed up as soon as possible or go to the Emergency Department if any problems should occur.  Please show the CHEMOTHERAPY ALERT CARD or  IMMUNOTHERAPY ALERT CARD at check-in to the Emergency Department and triage nurse.  Should you have questions after your visit or need to cancel or reschedule your appointment, please contact [facility] - A DEPT OF [facility]  Dept: [phone]  and follow the prompts.  Office hours are 8:00 a.m. to 4:30 p.m. Monday - Friday. Please note that voicemails left after 4:00 p.m. may not be returned until the following business day.  We are closed weekends and major holidays. You have access to a nurse at all times for urgent questions. Please call the main number to the clinic Dept: [phone] and follow the prompts.   For any non-urgent questions, you may also contact your provider using MyChart. We now offer e-Visits for anyone 67 and older to request care online for non-urgent symptoms. For details visit [email or web address].   Also download the MyChart app! Go to the app store, search MyChart, open the app, select [place], and log in with your MyChart username and password.

## 2023-10-13 NOTE — Procedures (Signed)
 Successful placement of dual lumen PICC line to right basilic vein. Length 34cm Tip at lower SVC/RA PICC capped No complications Ready for use.  EBL < 5 mL   Solmon Selmer Ku PA-C 10/13/2023 11:13 AM

## 2023-10-13 NOTE — Progress Notes (Signed)
 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  Biopsy confirmed squamous cell carcinoma. - Staging PET scan showed intense of hypermetabolic activity extending from the anus into the rectum, and the small bilateral inguinal lymph nodes with low-level activity.  No evidence of distant metastasis. -She will start concurrent chemoradiation with mitomycin  and 5-FU on September 15, 2023.  Assessment & Plan Anal cancer undergoing treatment Currently in the fifth week of concurrent chemoradiation. Counts have recovered well, with only a slight drop in platelet count, which has since rebounded. No need for dose reduction. Potential for side effects to worsen in the second round of treatment. Effects of chemoradiation continue for up to three months post-treatment. Follow-up process includes PET scan and potential need for surgery if residual tumor is present. Surgery may be considered after three months if chemoradiation does not cure the cancer. - Continue current dose of chemoradiation. - Order PET scan four to six weeks after completion of radiation. - Schedule follow-up with Dr. [name] one month after radiation completion. - Discuss potential surgery if residual tumor is present after three months.  Nausea and vomiting due to chemotherapy Nausea and vomiting were significant during the first cycle, lasting three weeks. Currently  managed with Zofran  and Phenergan . Discussed the use of dexamethasone  and Ativan  for additional control of nausea. Emphasized the importance of preemptive management of nausea in the upcoming weeks. - Take Zofran  three times daily, 30 minutes before meals. - Use Phenergan  as needed between meals, up to four times daily. - Start dexamethasone  in the morning after breakfast for three to five days as needed. - Continue Ativan  daily for anxiety and as needed for nausea, up to three times daily. - Refill prescriptions for Ativan  and Phenergan .  Skin irritation due to radiation Experiencing skin irritation in the groin area and redness without ulceration. Skin breakdown observed in the front area. Importance of maintaining skin hygiene to promote healing discussed. - Use a bidet or baby wipes to clean the area regularly, ensuring it is dried thoroughly. - Adjust bidet temperature to avoid irritation.  Diarrhea Recent onset of diarrhea, with previous constipation managed by MiraLAX. Importance of hydration, especially with loose bowel movements, discussed. - Increase oral fluid intake, including Gatorade, Pedialyte, or liquid IV. - Schedule IV fluids on Wednesday and Friday.  Anxiety Anxiety is being managed with daily Ativan , which also aids in controlling nausea. Discussed the dual purpose of Ativan  for both anxiety and nausea management. - Continue Ativan  daily for anxiety. - Use Ativan  as needed for nausea, up to three times daily.  Plan - Lab reviewed, adequate for treatment, will proceed with day 29 chemotherapy mitomycin  and 5-FU at the same dose -Continue radiation - lab and follow-up weekly for next 2 weeks   SUMMARY OF ONCOLOGIC HISTORY: Oncology History  Squamous cell carcinoma of anus (HCC)  08/26/2023 Initial Diagnosis   Squamous cell carcinoma of anus (HCC)   09/09/2023 Cancer Staging  Staging form: Anus, AJCC 8th Edition - Clinical: Stage IIIC (cT3, cN1a, cM0) - Signed by  [name], MD on 09/09/2023   09/15/2023 -  Chemotherapy   Patient is on Treatment Plan : ANUS Mitomycin  D1,28 + 5FU D1-4, 28-31 q32d        Discussed the use of AI scribe software for clinical note transcription with the patient, who gave verbal consent to proceed.  History of Present Illness Misty [name] is a 68 year old female with anal cancer who presents for follow-up during concurrent chemoradiation therapy.  She is in the fifth week of concurrent chemoradiation therapy for anal cancer, experiencing significant nausea and vomiting, managed with Zofran  and Phenergan . Mouth sores occur, lasting five to six days per cycle, managed with Magic Mouthwash. Despite these side effects, she has maintained her weight, gaining a pound recently.  She experienced a recent episode of diarrhea, the first occurrence, and previously had constipation managed with MiraLAX. Initial bleeding has resolved.  Skin issues include peeling in the groin area and swelling and redness in the back, causing discomfort. She uses a bidet for symptom management.  She takes Ativan  daily for anxiety, which also helps with nausea, having replaced Valium with Ativan . She does not drive due to her treatment and is chauffeured by her caregiver.     All other systems were reviewed with the patient and are negative.  MEDICAL HISTORY:  Past Medical History:  Diagnosis Date   Anxiety    Bipolar disorder (HCC)    Dr. Vincente   COPD (chronic obstructive pulmonary disease) (HCC) 2006   Depression    Hypothyroidism    Rheumatic fever    as a child, age 74yo   Urinary tract infection    hx/o   Wears glasses     SURGICAL HISTORY: Past Surgical History:  Procedure Laterality Date   ABDOMINAL HYSTERECTOMY     APPENDECTOMY     CESAREAN SECTION     UTERINE FIBROID SURGERY      I have reviewed the social history and family history with the patient and they are unchanged from previous note.  ALLERGIES:  is  allergic to codeine.  MEDICATIONS:  Current Outpatient Medications  Medication Sig Dispense Refill   acetaminophen  (TYLENOL ) 500 MG tablet Take 1,000 mg by mouth every 4 (four) hours as needed for moderate pain.     albuterol  (VENTOLIN  HFA) 108 (90 Base) MCG/ACT inhaler Inhale 2 puffs into the lungs every 6 (six) hours as needed for wheezing or shortness of breath. 1 each 3   ALBUTEROL  IN Albuterol  inhaler     atorvastatin  (LIPITOR) 20 MG tablet Take 20 mg by mouth daily.   3   baclofen  (LIORESAL ) 10 MG tablet TAKE 1 TABLET THREE TIMES DAILY     buPROPion  (WELLBUTRIN  XL) 150 MG 24 hr tablet Take 300 mg by mouth every morning.      dexamethasone  (DECADRON ) 4 MG tablet Take 1 tablet by mouth in the morning with meals, for 3-5 days after chemo 10 tablet 0   diazepam (VALIUM) 5 MG tablet Take 1 tablet by mouth in the morning and at bedtime.     Docusate Sodium 100 MG capsule Take 100 mg by mouth.     fluticasone  (FLONASE) 50 MCG/ACT nasal spray Place 2 sprays into both nostrils.     Fluticasone -Umeclidin-Vilant (TRELEGY ELLIPTA ) 100-62.5-25 MCG/ACT AEPB Inhale 1 puff into the lungs daily. 180 each 0   gabapentin  (NEURONTIN ) 600 MG tablet Take 300-900 mg  by mouth 2 (two) times daily. Take 0.5 tablet (300 mg) in the morning and Take 1.5 (900 mg) in the evening     guaiFENesin  (MUCINEX ) 600 MG 12 hr tablet Take 600 mg by mouth 2 (two) times daily as needed for cough.     HYDROmorphone  (DILAUDID ) 2 MG tablet Take 1 tablet (2 mg total) by mouth every 4 (four) hours as needed for severe pain (pain score 7-10). 40 tablet 0   ipratropium-albuterol  (DUONEB) 0.5-2.5 (3) MG/3ML SOLN Take 3 mLs by nebulization every 4 (four) hours as needed. 360 mL 1   lamoTRIgine  (LAMICTAL ) 200 MG tablet Take 200 mg by mouth at bedtime.      levothyroxine  (SYNTHROID , LEVOTHROID) 75 MCG tablet Take 0.75 mcg by mouth daily.     loratadine (CLARITIN) 10 MG tablet Take 10 mg by mouth daily.     LORazepam  (ATIVAN ) 0.5 MG tablet  Take 1-2 tablets (0.5-1 mg total) by mouth every 8 (eight) hours as needed for anxiety (or nausea). 30 tablet 0   magic mouthwash (nystatin , lidocaine , diphenhydrAMINE, alum & mag hydroxide) suspension Swish and swallow 5 mLs by mouth 3 (three) times daily as needed for mouth pain. 140 mL 1   montelukast  (SINGULAIR ) 10 MG tablet Take 1 tablet (10 mg total) by mouth at bedtime. 90 tablet 3   Multiple Vitamins-Minerals (CENTRUM ADULT PO) Take by mouth.     naltrexone (DEPADE) 50 MG tablet Take 50 mg by mouth.     ondansetron  (ZOFRAN ) 8 MG tablet Take 1 tablet (8 mg total) by mouth every 8 (eight) hours as needed for nausea or vomiting. Do not use for 72 hours following chemotherapy. 30 tablet 1   polyethylene glycol (MIRALAX) 17 g packet MiraLax     promethazine  (PHENERGAN ) 25 MG tablet Take 1 tablet (25 mg total) by mouth every 6 (six) hours as needed for nausea or vomiting. 30 tablet 1   Specialty Vitamins Products (HAIR NOURISHING SUPPLEMENT PO) Nutrafol hair supplement.  4 tabs daily.     valACYclovir (VALTREX) 500 MG tablet Take 500 mg by mouth daily.     zolpidem  (AMBIEN ) 10 MG tablet Take 10 mg by mouth daily.     No current facility-administered medications for this visit.   Facility-Administered Medications Ordered in Other Visits  Medication Dose Route Frequency Provider Last Rate Last Admin   0.9 %  sodium chloride  infusion   Intravenous Continuous [name], MD   Stopped at 10/13/23 1424   fluorouracil  (ADRUCIL ) 7,000 mg in sodium chloride  0.9 % 110 mL chemo infusion  1,000 mg/m2/day (Order-Specific) Intravenous 4 days [name], MD   Infusion Verify at 10/13/23 1439    PHYSICAL EXAMINATION: ECOG PERFORMANCE STATUS: 1 - Symptomatic but completely ambulatory  Vitals:   10/13/23 1127  BP: (!) 112/58  Pulse: 94  Resp: 16  Temp: 97.9 F (36.6 C)  SpO2: 96%   Wt Readings from Last 3 Encounters:  10/13/23 154 lb 8 oz (70.1 kg)  10/06/23 152 lb 8 oz (69.2 kg)  09/30/23 151 lb  3.2 oz (68.6 kg)     GENERAL:alert, no distress and comfortable EYES: normal, Conjunctiva are pink and non-injected, sclera clear NECK: supple, thyroid normal size, non-tender, without nodularity LYMPH:  no palpable lymphadenopathy in the cervical, axillary  LUNGS: clear to auscultation and percussion with normal breathing effort HEART: regular rate & rhythm and no murmurs and no lower extremity edema ABDOMEN:abdomen soft, non-tender and normal bowel sounds Musculoskeletal:no cyanosis of digits and  no clubbing  NEURO: alert & oriented x 3 with fluent speech, no focal motor/sensory deficits RECTAL: Anus with redness, no skin breakdown, no blisters, no ulcers. (+) skin peeling in both groin area Physical Exam    LABORATORY DATA:  I have reviewed the data as listed    Latest Ref Rng & Units 10/13/2023   11:19 AM 10/06/2023    9:46 AM 09/30/2023   10:35 AM  CBC  WBC 4.0 - 10.5 K/uL 6.8  4.4  5.0   Hemoglobin 12.0 - 15.0 g/dL 9.7  89.3  89.3   Hematocrit 36.0 - 46.0 % 29.5  32.3  32.2   Platelets 150 - 400 K/uL 258  191  89         Latest Ref Rng & Units 10/13/2023   11:19 AM 10/06/2023    9:46 AM 09/30/2023   10:35 AM  CMP  Glucose 70 - 99 mg/dL 878  93  892   BUN 8 - 23 mg/dL 18  16  18    Creatinine 0.44 - 1.00 mg/dL 9.09  8.85  8.85   Sodium 135 - 145 mmol/L 136  138  137   Potassium 3.5 - 5.1 mmol/L 4.2  4.2  4.3   Chloride 98 - 111 mmol/L 104  105  106   CO2 22 - 32 mmol/L 26  27  26    Calcium  8.9 - 10.3 mg/dL 9.3  9.6  9.3   Total Protein 6.5 - 8.1 g/dL 6.8  6.9  6.7   Total Bilirubin 0.0 - 1.2 mg/dL 0.2  0.3  0.4   Alkaline Phos 38 - 126 U/L 96  88  92   AST 15 - 41 U/L 19  20  22    ALT 0 - 44 U/L 23  21  32       RADIOGRAPHIC STUDIES: I have personally reviewed the radiological images as listed and agreed with the findings in the report. IR PICC PLACEMENT RIGHT >5 YRS INC IMG GUIDE Result Date: 10/13/2023 INDICATION: 68 year old female with history of anal cancer  presents in need of durable venous access. Peripherally inserted central catheter requested. EXAM: PICC LINE PLACEMENT WITH ULTRASOUND AND FLUOROSCOPIC GUIDANCE MEDICATIONS: 5 mL 1% lidocaine  ANESTHESIA/SEDATION: None FLUOROSCOPY: Radiation Exposure Index (as provided by the fluoroscopic device): 1.0 mGy Kerma COMPLICATIONS: None immediate. PROCEDURE: The patient was advised of the possible risks and complications and agreed to undergo the procedure. The patient was then brought to the angiographic suite for the procedure. The right arm was prepped with chlorhexidine, draped in the usual sterile fashion using maximum barrier technique (cap and mask, sterile gown, sterile gloves, large sterile sheet, hand hygiene and cutaneous antisepsis) and infiltrated locally with 1% Lidocaine . Ultrasound demonstrated patency of the right basilic vein, and this was documented with an image. Under real-time ultrasound guidance, this vein was accessed with a 21 gauge micropuncture needle and image documentation was performed. A 0.018 wire was introduced in to the vein. Over this, a 5 [place] dual lumen power PICC was advanced to the lower SVC/right atrial junction. Fluoroscopy during the procedure and fluoro spot radiograph confirms appropriate catheter position. The catheter was flushed and covered with a sterile dressing. Catheter length: 34 IMPRESSION: Successful right arm Power, dual lumen PICC line placement with ultrasound and fluoroscopic guidance. The catheter is ready for use. Performed by: Kacie Matthews PA-C Electronically Signed   By: Cordella Banner   On: 10/13/2023 11:13      No  orders of the defined types were placed in this encounter.  All questions were answered. The patient knows to call the clinic with any problems, questions or concerns. No barriers to learning was detected. The total time spent in the appointment was 30 minutes, including review of chart and various tests results, discussions about plan  of care and coordination of care plan     Onita Mattock, MD 10/13/2023

## 2023-10-14 ENCOUNTER — Other Ambulatory Visit: Payer: Self-pay

## 2023-10-14 ENCOUNTER — Telehealth: Payer: Self-pay | Admitting: Hematology

## 2023-10-14 ENCOUNTER — Ambulatory Visit
Admission: RE | Admit: 2023-10-14 | Discharge: 2023-10-14 | Disposition: A | Discharge status: Home / Self Care | Payer: Medicare (Managed Care) | Source: Ambulatory Visit | Attending: Radiation Oncology | Admitting: Radiation Oncology

## 2023-10-14 DIAGNOSIS — Z510 Encounter for antineoplastic radiation therapy: Secondary | ICD-10-CM | POA: Diagnosis not present

## 2023-10-14 LAB — RAD ONC ARIA SESSION SUMMARY
Course Elapsed Days: 29
Plan Fractions Treated to Date: 21
Plan Prescribed Dose Per Fraction: 1.8 Gy
Plan Total Fractions Prescribed: 30
Plan Total Prescribed Dose: 54 Gy
Reference Point Dosage Given to Date: 37.8 Gy
Reference Point Session Dosage Given: 1.8 Gy
Session Number: 21

## 2023-10-14 NOTE — Telephone Encounter (Signed)
 Scheduled appointments per 7/21 los. Called and left a VM with appointment details for the patient.

## 2023-10-15 ENCOUNTER — Ambulatory Visit
Admission: RE | Admit: 2023-10-15 | Discharge: 2023-10-15 | Discharge status: Home / Self Care | Payer: Medicare (Managed Care) | Source: Ambulatory Visit | Attending: Radiation Oncology

## 2023-10-15 ENCOUNTER — Other Ambulatory Visit: Payer: Self-pay

## 2023-10-15 DIAGNOSIS — Z510 Encounter for antineoplastic radiation therapy: Secondary | ICD-10-CM | POA: Diagnosis not present

## 2023-10-15 LAB — RAD ONC ARIA SESSION SUMMARY
Course Elapsed Days: 30
Plan Fractions Treated to Date: 22
Plan Prescribed Dose Per Fraction: 1.8 Gy
Plan Total Fractions Prescribed: 30
Plan Total Prescribed Dose: 54 Gy
Reference Point Dosage Given to Date: 39.6 Gy
Reference Point Session Dosage Given: 1.8 Gy
Session Number: 22

## 2023-10-16 ENCOUNTER — Other Ambulatory Visit: Payer: Self-pay

## 2023-10-16 ENCOUNTER — Ambulatory Visit
Admission: RE | Admit: 2023-10-16 | Discharge: 2023-10-16 | Disposition: A | Discharge status: Home / Self Care | Payer: Medicare (Managed Care) | Source: Ambulatory Visit | Attending: Radiation Oncology | Admitting: Radiation Oncology

## 2023-10-16 DIAGNOSIS — Z510 Encounter for antineoplastic radiation therapy: Secondary | ICD-10-CM | POA: Diagnosis not present

## 2023-10-16 LAB — RAD ONC ARIA SESSION SUMMARY
Course Elapsed Days: 31
Plan Fractions Treated to Date: 23
Plan Prescribed Dose Per Fraction: 1.8 Gy
Plan Total Fractions Prescribed: 30
Plan Total Prescribed Dose: 54 Gy
Reference Point Dosage Given to Date: 41.4 Gy
Reference Point Session Dosage Given: 1.8 Gy
Session Number: 23

## 2023-10-17 ENCOUNTER — Ambulatory Visit
Admission: RE | Admit: 2023-10-17 | Discharge: 2023-10-17 | Disposition: A | Discharge status: Home / Self Care | Payer: Medicare (Managed Care) | Source: Ambulatory Visit | Attending: Radiation Oncology | Admitting: Radiation Oncology

## 2023-10-17 ENCOUNTER — Encounter: Payer: Self-pay | Admitting: Hematology

## 2023-10-17 ENCOUNTER — Inpatient Hospital Stay: Payer: Medicare (Managed Care)

## 2023-10-17 ENCOUNTER — Other Ambulatory Visit: Payer: Self-pay

## 2023-10-17 ENCOUNTER — Other Ambulatory Visit (HOSPITAL_COMMUNITY): Payer: Self-pay

## 2023-10-17 ENCOUNTER — Inpatient Hospital Stay: Payer: Medicare (Managed Care) | Admitting: Licensed Clinical Social Worker

## 2023-10-17 VITALS — BP 118/68 | HR 88 | Resp 17

## 2023-10-17 DIAGNOSIS — C210 Malignant neoplasm of anus, unspecified: Secondary | ICD-10-CM

## 2023-10-17 DIAGNOSIS — E860 Dehydration: Secondary | ICD-10-CM

## 2023-10-17 DIAGNOSIS — R112 Nausea with vomiting, unspecified: Secondary | ICD-10-CM

## 2023-10-17 DIAGNOSIS — Z5111 Encounter for antineoplastic chemotherapy: Secondary | ICD-10-CM | POA: Diagnosis not present

## 2023-10-17 DIAGNOSIS — Z95828 Presence of other vascular implants and grafts: Secondary | ICD-10-CM

## 2023-10-17 DIAGNOSIS — Z510 Encounter for antineoplastic radiation therapy: Secondary | ICD-10-CM | POA: Diagnosis not present

## 2023-10-17 LAB — RAD ONC ARIA SESSION SUMMARY
Course Elapsed Days: 32
Plan Fractions Treated to Date: 24
Plan Prescribed Dose Per Fraction: 1.8 Gy
Plan Total Fractions Prescribed: 30
Plan Total Prescribed Dose: 54 Gy
Reference Point Dosage Given to Date: 43.2 Gy
Reference Point Session Dosage Given: 1.8 Gy
Session Number: 24

## 2023-10-17 MED ORDER — SODIUM CHLORIDE 0.9% FLUSH: 10.0000 mL | INTRAVENOUS | Status: DC | PRN

## 2023-10-17 MED ORDER — SODIUM CHLORIDE 0.9 % IV SOLN: Freq: Once | INTRAVENOUS | Status: AC

## 2023-10-17 MED ORDER — HEPARIN SOD (PORK) LOCK FLUSH 100 UNIT/ML IV SOLN: 250.0000 [IU] | Freq: Once | INTRAVENOUS | Status: DC | PRN

## 2023-10-17 NOTE — Progress Notes (Signed)
 Pt presents to infusion today for pump d/c, IV fluids, and PICC line removal. IVF infused over 1 hr, tolerated well. PICC line of 34cm removed, pressure held for 5 min, dressing placed, pt observed for 30 min post removal. Discharge instructions reviewed, pt verbalized understanding. Discharged ambulatory to lobby with VSS.

## 2023-10-17 NOTE — Patient Instructions (Signed)
 PICC Removal, Adult, Care After The following information offers guidance on how to care for yourself after your procedure. Your health care provider may also give you more specific instructions. If you have problems or questions, contact your health care provider. What can I expect after the procedure? After the procedure, it is common to have: Tenderness or soreness. Redness, swelling, or a scab at the place where your PICC was removed (exit site). Follow these instructions at home: For the first 24 hours after the procedure: Keep the bandage (dressing) on your exit site clean and dry. Do not remove your dressing until your health care provider tells you to do so. Do not lift anything heavy or do activities that require great effort until your health care provider says it is okay. You should avoid: Lifting weights. Doing yard work. Doing any physical activity with repetitive arm movement. Watch closely for any signs of an air bubble in the vein (air embolism). This is a rare but serious complication. Signs of an air embolism include trouble breathing, wheezing, chest pain, or a fast pulse. If you have signs of an air embolism, call 911 right away and lie down on your left side to keep the air from moving into your lungs. After 24 hours have passed:  Remove your dressing as told by your health care provider. Wash your hands with soap and water for at least 20 seconds before and after you change your dressing. If soap and water are not available, use hand sanitizer. Return to your normal activities as told by your health care provider. A small scab may develop over the exit site. Do not pick at the scab. When bathing or showering, gently wash the exit site with soap and water. Pat it dry. Watch for signs of infection, such as: A fever or chills. Swollen glands under your arm. More redness, swelling, or soreness around your arm. Blood, fluid, or pus coming from your exit site. Warmth or a  bad smell coming from your exit site. A red streak spreading away from your exit site. General instructions Take over-the-counter and prescription medicines only as told by your health care provider. Do not take any new medicines without checking with your health care provider first. If you were given an antibiotic ointment, apply it as told by your health care provider. Keep all follow-up visits. This is important. Contact a health care provider if: You have a fever or chills. You have swelling at your exit site or swollen glands under your arm. You have signs of infection at your exit site. You have soreness, redness, or swelling in your arm that gets worse. Get help right away if: You have numbness or tingling in your fingers, hand, or arm. Your arm looks blue and feels cold. You have signs of an air embolism, such as trouble breathing, wheezing, chest pain, or a fast pulse. These symptoms may be an emergency. Get medical help right away. Call 911. Do not wait to see if the symptoms will go away. Do not drive yourself to the hospital. Summary After a PICC is removed, it is common to have tenderness or soreness, redness, swelling, or a scab at the exit site. Keep the bandage (dressing) over the exit site clean and dry. Do not remove the dressing until your health care provider tells you to do so. Do not lift anything heavy or do activities that require great effort until your health care provider says it is okay. Watch closely for any signs  of an air bubble (air embolism). If you have signs of an air embolism, call 911 right away and lie down on your left side. This information is not intended to replace advice given to you by your health care provider. Make sure you discuss any questions you have with your health care provider. Document Revised: 09/27/2020 Document Reviewed: 09/27/2020 Elsevier Patient Education  2024 [employer].

## 2023-10-19 NOTE — Progress Notes (Unsigned)
 [facility]   [phone] [phone]    Patient Care Team: Beam, [name], MD as PCP - General (Family Medicine) Ardis, [name], RN as Oncology Nurse Navigator [name] MATSU, NP as Nurse Practitioner (Psychology) [name], MD as Consulting Physician (Hematology and Oncology)   CHIEF COMPLAINT: Follow up anal cancer   Oncology History  Squamous cell carcinoma of anus (HCC)  08/26/2023 Initial Diagnosis   Squamous cell carcinoma of anus (HCC)   09/09/2023 Cancer Staging   Staging form: Anus, AJCC 8th Edition - Clinical: Stage IIIC (cT3, cN1a, cM0) - Signed by [name], MD on 09/09/2023   09/15/2023 -  Chemotherapy   Patient is on Treatment Plan : ANUS Mitomycin  D1,28 + 5FU D1-4, 28-31 q32d        CURRENT THERAPY: Concurrent chemoradiation 5FU/mitomycin   INTERVAL HISTORY Misty [name] returns for follow up as scheduled, completed week 5 chemo last week. The weekend was rough mainly with skin burning/peeling and pain. Dr. Dewey is adjusting dilaudid  which is helping, takes q6h. Bowels moving. N/v are managed. She is not taking ativan  due to dilaudid  so she is more anxious but OK. Mouth sores have started, she filled magic mouthwash. Able to eat and drink 4 bottles per day. Mouth is dry. She is lightheaded/dizzy especially when up at night.   ROS  All other systems reviewed and negative   Past Medical History:  Diagnosis Date   Anxiety    Bipolar disorder (HCC)    Dr. Vincente   COPD (chronic obstructive pulmonary disease) (HCC) 2006   Depression    Hypothyroidism    Rheumatic fever    as a child, age 22yo   Urinary tract infection    hx/o   Wears glasses      Past Surgical History:  Procedure Laterality Date   ABDOMINAL HYSTERECTOMY     APPENDECTOMY     CESAREAN SECTION     UTERINE FIBROID SURGERY       Outpatient Encounter Medications as of 10/20/2023  Medication Sig Note   acetaminophen  (TYLENOL ) 500 MG tablet  Take 1,000 mg by mouth every 4 (four) hours as needed for moderate pain.    albuterol  (VENTOLIN  HFA) 108 (90 Base) MCG/ACT inhaler Inhale 2 puffs into the lungs every 6 (six) hours as needed for wheezing or shortness of breath.    ALBUTEROL  IN Albuterol  inhaler    atorvastatin  (LIPITOR) 20 MG tablet Take 20 mg by mouth daily.  11/27/2014: Received from: External Pharmacy Received Sig: Take 20 mg by mouth daily.   baclofen  (LIORESAL ) 10 MG tablet TAKE 1 TABLET THREE TIMES DAILY    buPROPion  (WELLBUTRIN  XL) 150 MG 24 hr tablet Take 300 mg by mouth every morning.     dexamethasone  (DECADRON ) 4 MG tablet Take 1 tablet by mouth in the morning with meals, for 3-5 days after chemo    diazepam  (VALIUM ) 5 MG tablet Take 1 tablet by mouth in the morning and at bedtime.    Docusate Sodium 100 MG capsule Take 100 mg by mouth.    fluticasone  (FLONASE) 50 MCG/ACT nasal spray Place 2 sprays into both nostrils.    Fluticasone -Umeclidin-Vilant (TRELEGY ELLIPTA ) 100-62.5-25 MCG/ACT AEPB Inhale 1 puff into the lungs daily.    gabapentin  (NEURONTIN ) 600 MG tablet Take 300-900 mg by mouth 2 (two) times daily. Take 0.5 tablet (300 mg) in the morning and Take 1.5 (900 mg) in the evening    guaiFENesin  (MUCINEX ) 600  MG 12 hr tablet Take 600 mg by mouth 2 (two) times daily as needed for cough.    HYDROmorphone  (DILAUDID ) 2 MG tablet Take 1 tablet (2 mg total) by mouth every 4 (four) hours as needed for severe pain (pain score 7-10).    ipratropium-albuterol  (DUONEB) 0.5-2.5 (3) MG/3ML SOLN Take 3 mLs by nebulization every 4 (four) hours as needed.    lamoTRIgine  (LAMICTAL ) 200 MG tablet Take 200 mg by mouth at bedtime.     levothyroxine  (SYNTHROID , LEVOTHROID) 75 MCG tablet Take 0.75 mcg by mouth daily.    loratadine  (CLARITIN ) 10 MG tablet Take 10 mg by mouth daily.    LORazepam  (ATIVAN ) 0.5 MG tablet Take 1-2 tablets (0.5-1 mg total) by mouth every 8 (eight) hours as needed for anxiety (or nausea).    magic mouthwash  (nystatin , lidocaine , diphenhydrAMINE, alum & mag hydroxide) suspension Swish and swallow 5 mLs by mouth 3 (three) times daily as needed for mouth pain.    montelukast  (SINGULAIR ) 10 MG tablet Take 1 tablet (10 mg total) by mouth at bedtime.    Multiple Vitamins-Minerals (CENTRUM ADULT PO) Take by mouth.    naltrexone (DEPADE) 50 MG tablet Take 50 mg by mouth.    ondansetron  (ZOFRAN ) 8 MG tablet Take 1 tablet (8 mg total) by mouth every 8 (eight) hours as needed for nausea or vomiting. Do not use for 72 hours following chemotherapy.    polyethylene glycol (MIRALAX) 17 g packet MiraLax    promethazine  (PHENERGAN ) 25 MG tablet Take 1 tablet (25 mg total) by mouth every 6 (six) hours as needed for nausea or vomiting.    Specialty Vitamins Products (HAIR NOURISHING SUPPLEMENT PO) Nutrafol hair supplement.  4 tabs daily.    valACYclovir  (VALTREX ) 500 MG tablet Take 500 mg by mouth daily.    zolpidem  (AMBIEN ) 10 MG tablet Take 10 mg by mouth daily.    No facility-administered encounter medications on file as of 10/20/2023.     Today's Vitals   10/20/23 1027 10/20/23 1105 10/20/23 1153  BP: (!) 138/58    Pulse: 95    Resp: 20    Temp: 97.6 F (36.4 C)    SpO2: 93% 100%   Weight: 156 lb (70.8 kg)    PainSc:   0-No pain   Body mass index is 27.63 kg/m.   ECOG PERFORMANCE STATUS: 1 - Symptomatic but completely ambulatory  PHYSICAL EXAM GENERAL:alert, no distress and comfortable SKIN: no rash  HEENT: ulcers on the tongue. Sclera clear LUNGS: clear with normal breathing effort HEART: regular rate & rhythm NEURO: alert & oriented x 3 with fluent speech, no focal motor/sensory deficits RECTAL: external exam reveals peeling and ulceration/desquamation with moisture throughout the gluteal fold and perineum, extending to the groin   CBC    Latest Ref Rng & Units 10/20/2023    9:41 AM 10/13/2023   11:19 AM 10/06/2023    9:46 AM  CBC  WBC 4.0 - 10.5 K/uL 4.1  6.8  4.4   Hemoglobin 12.0 - 15.0  g/dL 8.8  9.7  89.3   Hematocrit 36.0 - 46.0 % 26.9  29.5  32.3   Platelets 150 - 400 K/uL 172  258  191       CMP     Latest Ref Rng & Units 10/20/2023    9:41 AM 10/13/2023   11:19 AM 10/06/2023    9:46 AM  CMP  Glucose 70 - 99 mg/dL 898  878  93   BUN  8 - 23 mg/dL 26  18  16    Creatinine 0.44 - 1.00 mg/dL 8.75  9.09  8.85   Sodium 135 - 145 mmol/L 138  136  138   Potassium 3.5 - 5.1 mmol/L 3.6  4.2  4.2   Chloride 98 - 111 mmol/L 103  104  105   CO2 22 - 32 mmol/L 29  26  27    Calcium  8.9 - 10.3 mg/dL 9.1  9.3  9.6   Total Protein 6.5 - 8.1 g/dL 6.5  6.8  6.9   Total Bilirubin 0.0 - 1.2 mg/dL 0.4  0.2  0.3   Alkaline Phos 38 - 126 U/L 70  96  88   AST 15 - 41 U/L 11  19  20    ALT 0 - 44 U/L 15  23  21        ASSESSMENT & PLAN: Squamous cell carcinoma of anus, cT3N1M0, stage IIIC -Presented with rectal pain and colonoscopy showed a 3 cm mass in the anus.  Biopsy confirmed squamous cell carcinoma. - Staging PET scan showed intense of hypermetabolic activity extending from the anus into the rectum, and the small bilateral inguinal lymph nodes with low-level activity.  No evidence of distant metastasis. -Started concurrent chemoradiation with mitomycin  and 5-FU on September 15, 2023. - She tolerated the first week poorly with mucositis and uncontrolled nausea and vomiting, not well-managed at home, required IVF in clinic. SEs improved by week 2 -3 with aggressive supportive care -S/p week 5 chemoRT, tolerating well with aggressive supportive care  PLAN: -Labs reviewed -IVF today and 7/31 -Continue RT and supportive care -F/up next week -Reviewed recovery/trajectory and post treatment imaging/exams   All questions were answered. The patient knows to call the clinic with any problems, questions or concerns. No barriers to learning were detected. I spent 20 minutes counseling the patient face to face. The total time spent in the appointment was 30 minutes and more than 50% was on  counseling, review of test results, and coordination of care.   Misty Emmerich K Quinetta Shilling, NP 10/20/2023

## 2023-10-20 ENCOUNTER — Inpatient Hospital Stay (HOSPITAL_BASED_OUTPATIENT_CLINIC_OR_DEPARTMENT_OTHER): Payer: Medicare (Managed Care) | Admitting: Nurse Practitioner

## 2023-10-20 ENCOUNTER — Ambulatory Visit
Admission: RE | Admit: 2023-10-20 | Discharge: 2023-10-20 | Disposition: A | Discharge status: Home / Self Care | Payer: Medicare (Managed Care) | Source: Ambulatory Visit | Attending: Radiation Oncology

## 2023-10-20 ENCOUNTER — Other Ambulatory Visit: Payer: Self-pay

## 2023-10-20 ENCOUNTER — Encounter: Payer: Self-pay | Admitting: Nurse Practitioner

## 2023-10-20 ENCOUNTER — Encounter: Payer: Self-pay | Admitting: Hematology

## 2023-10-20 ENCOUNTER — Inpatient Hospital Stay: Payer: Medicare (Managed Care)

## 2023-10-20 VITALS — BP 116/50 | HR 89 | Temp 98.4°F | Resp 18

## 2023-10-20 VITALS — BP 138/58 | HR 95 | Temp 97.6°F | Resp 20 | Wt 156.0 lb

## 2023-10-20 DIAGNOSIS — Z95828 Presence of other vascular implants and grafts: Secondary | ICD-10-CM

## 2023-10-20 DIAGNOSIS — R112 Nausea with vomiting, unspecified: Secondary | ICD-10-CM

## 2023-10-20 DIAGNOSIS — C210 Malignant neoplasm of anus, unspecified: Secondary | ICD-10-CM

## 2023-10-20 DIAGNOSIS — E860 Dehydration: Secondary | ICD-10-CM

## 2023-10-20 DIAGNOSIS — Z5111 Encounter for antineoplastic chemotherapy: Secondary | ICD-10-CM | POA: Diagnosis not present

## 2023-10-20 DIAGNOSIS — Z510 Encounter for antineoplastic radiation therapy: Secondary | ICD-10-CM | POA: Diagnosis not present

## 2023-10-20 LAB — CBC WITH DIFFERENTIAL (CANCER CENTER ONLY)
Abs Immature Granulocytes: 0.03 K/uL (ref 0.00–0.07)
Basophils Absolute: 0 K/uL (ref 0.0–0.1)
Basophils Relative: 0 %
Eosinophils Absolute: 1.1 K/uL — ABNORMAL HIGH (ref 0.0–0.5)
Eosinophils Relative: 26 %
HCT: 26.9 % — ABNORMAL LOW (ref 36.0–46.0)
Hemoglobin: 8.8 g/dL — ABNORMAL LOW (ref 12.0–15.0)
Immature Granulocytes: 1 %
Lymphocytes Relative: 3 %
Lymphs Abs: 0.1 K/uL — ABNORMAL LOW (ref 0.7–4.0)
MCH: 31.4 pg (ref 26.0–34.0)
MCHC: 32.7 g/dL (ref 30.0–36.0)
MCV: 96.1 fL (ref 80.0–100.0)
Monocytes Absolute: 0.1 K/uL (ref 0.1–1.0)
Monocytes Relative: 2 %
Neutro Abs: 2.7 K/uL (ref 1.7–7.7)
Neutrophils Relative %: 68 %
Platelet Count: 172 K/uL (ref 150–400)
RBC: 2.8 MIL/uL — ABNORMAL LOW (ref 3.87–5.11)
RDW: 15.9 % — ABNORMAL HIGH (ref 11.5–15.5)
WBC Count: 4.1 K/uL (ref 4.0–10.5)
nRBC: 0 % (ref 0.0–0.2)

## 2023-10-20 LAB — RAD ONC ARIA SESSION SUMMARY
Course Elapsed Days: 35
Plan Fractions Treated to Date: 25
Plan Prescribed Dose Per Fraction: 1.8 Gy
Plan Total Fractions Prescribed: 30
Plan Total Prescribed Dose: 54 Gy
Reference Point Dosage Given to Date: 45 Gy
Reference Point Session Dosage Given: 1.8 Gy
Session Number: 25

## 2023-10-20 LAB — CMP (CANCER CENTER ONLY)
ALT: 15 U/L (ref 0–44)
AST: 11 U/L — ABNORMAL LOW (ref 15–41)
Albumin: 3.6 g/dL (ref 3.5–5.0)
Alkaline Phosphatase: 70 U/L (ref 38–126)
Anion gap: 6 (ref 5–15)
BUN: 26 mg/dL — ABNORMAL HIGH (ref 8–23)
CO2: 29 mmol/L (ref 22–32)
Calcium: 9.1 mg/dL (ref 8.9–10.3)
Chloride: 103 mmol/L (ref 98–111)
Creatinine: 1.24 mg/dL — ABNORMAL HIGH (ref 0.44–1.00)
GFR, Estimated: 48 mL/min — ABNORMAL LOW (ref >60)
Glucose, Bld: 101 mg/dL — ABNORMAL HIGH (ref 70–99)
Potassium: 3.6 mmol/L (ref 3.5–5.1)
Sodium: 138 mmol/L (ref 135–145)
Total Bilirubin: 0.4 mg/dL (ref 0.0–1.2)
Total Protein: 6.5 g/dL (ref 6.5–8.1)

## 2023-10-20 MED ORDER — SODIUM CHLORIDE 0.9 % IV SOLN
Freq: Once | INTRAVENOUS | Status: AC
Start: 2023-10-20 — End: 2023-10-20

## 2023-10-20 NOTE — Patient Instructions (Signed)
 Dehydration, Adult Dehydration is a condition in which there is not enough water or other fluids in the body. This happens when a person loses more fluids than they take in. Important organs cannot work right without the right amount of fluids. Any loss of fluids from the body can cause dehydration. Dehydration can be mild, worse, or very bad. It should be treated right away to keep it from getting very bad. What are the causes? Conditions that cause loss of water in the body. They include: Watery poop (diarrhea). Vomiting. Sweating a lot. Fever. Infection. Peeing (urinating) a lot. Not drinking enough fluids. Certain medicines, such as medicines that take extra fluid out of the body (diuretics). Lack of safe drinking water. Not being able to get enough water and food. What increases the risk? Having a long-term (chronic) illness that has not been treated the right way, such as: Diabetes. Heart disease. Kidney disease. Being 25 years of age or older. Having a disability. Living in a place that is high above the ground or sea (high in altitude). The thinner, drier air causes more fluid loss. Doing exercises that put stress on your body for a long time. Being active when in hot places. What are the signs or symptoms? Symptoms of dehydration depend on how bad it is. Mild or worse dehydration Thirst. Dry lips or dry mouth. Feeling dizzy or light-headed. Muscle cramps. Passing little pee or dark pee. Pee may be the color of tea. Headache. Very bad dehydration Changes in skin. Skin may: Be cold to the touch (clammy). Be blotchy or pale. Not go back to normal right after you pinch it and let it go. Little or no tears, pee, or sweat. Fast breathing. Low blood pressure. Weak pulse. Pulse that is more than 100 beats a minute when you are sitting still. Other changes, such as: Feeling very thirsty. Eyes that look hollow (sunken). Cold hands and feet. Being confused. Being very  tired (lethargic) or having trouble waking from sleep. Losing weight. Loss of consciousness. How is this treated? Treatment for this condition depends on how bad your dehydration is. Treatment should start right away. Do not wait until your condition gets very bad. Very bad dehydration is an emergency. You will need to go to a hospital. Mild or worse dehydration can be treated at home. You may be asked to: Drink more fluids. Drink an oral rehydration solution (ORS). This drink gives you the right amount of fluids, salts, and minerals (electrolytes). Very bad dehydration can be treated: With fluids through an IV tube. By correcting low levels of electrolytes in the body. By treating the problem that caused your dehydration. Follow these instructions at home: Oral rehydration solution If told by your doctor, drink an ORS: Make an ORS. Use instructions on the package. Start by drinking small amounts, about  cup (120 mL) every 5-10 minutes. Slowly drink more until you have had the amount that your doctor said to have.  Eating and drinking  Drink enough clear fluid to keep your pee pale yellow. If you were told to drink an ORS, finish the ORS first. Then, start slowly drinking other clear fluids. Drink fluids such as: Water. Do not drink only water. Doing that can make the salt (sodium) level in your body get too low. Water from ice chips you suck on. Fruit juice that you have added water to (diluted). Low-calorie sports drinks. Eat foods that have the right amounts of salts and minerals, such as bananas, oranges, potatoes,  tomatoes, or spinach. Do not drink alcohol. Avoid drinks that have caffeine or sugar. These include:: High-calorie sports drinks. Fruit juice that you did not add water to. Soda. Coffee or energy drinks. Avoid foods that are greasy or have a lot of fat or sugar. General instructions Take over-the-counter and prescription medicines only as told by your doctor. Do  not take sodium tablets. Doing that can make the salt level in your body get too high. Return to your normal activities as told by your doctor. Ask your doctor what activities are safe for you. Keep all follow-up visits. Your doctor may check and change your treatment. Contact a doctor if: You have pain in your belly (abdomen) and the pain: Gets worse. Stays in one place. You have a rash. You have a stiff neck. You get angry or annoyed more easily than normal. You are more tired or have a harder time waking than normal. You feel weak or dizzy. You feel very thirsty. Get help right away if: You have any symptoms of very bad dehydration. You vomit every time you eat or drink. Your vomiting gets worse, does not go away, or you vomit blood or green stuff. You are getting treatment, but symptoms are getting worse. You have a fever. You have a very bad headache. You have: Diarrhea that gets worse or does not go away. Blood in your poop (stool). This may cause poop to look black and tarry. No pee in 6-8 hours. Only a small amount of pee in 6-8 hours, and the pee is very dark. You have trouble breathing. These symptoms may be an emergency. Get help right away. Call 911. Do not wait to see if the symptoms will go away. Do not drive yourself to the hospital. This information is not intended to replace advice given to you by your health care provider. Make sure you discuss any questions you have with your health care provider. Document Revised: 10/08/2021 Document Reviewed: 10/08/2021 Elsevier Patient Education  2024 [employer].

## 2023-10-20 NOTE — Progress Notes (Signed)
 [facility] CSW Progress Note  Clinical Child psychotherapist contacted patient by phone to follow-up on supportive services.    Interventions: Pt inquiring about assistance w/ acquiring a wig.  CSW informed pt wigs are available through [employer].  Referral sent to Cancer Services on behalf of pt.  Cancer Services confirmed receipt and will reach out to pt to assist.  CSW also emailed pt the link for [employer] to assist w/ out of pocket medical expenses.        Follow Up Plan:  Patient will contact CSW with any support or resource needs    Misty JONELLE Manna, LCSW Clinical Social Worker [facility]

## 2023-10-21 ENCOUNTER — Ambulatory Visit
Admission: RE | Admit: 2023-10-21 | Discharge: 2023-10-21 | Disposition: A | Discharge status: Home / Self Care | Payer: Medicare (Managed Care) | Source: Ambulatory Visit | Attending: Radiation Oncology | Admitting: Radiation Oncology

## 2023-10-21 ENCOUNTER — Encounter: Payer: Self-pay | Admitting: Radiation Oncology

## 2023-10-21 ENCOUNTER — Telehealth: Payer: Self-pay | Admitting: *Deleted

## 2023-10-21 ENCOUNTER — Other Ambulatory Visit: Payer: Self-pay

## 2023-10-21 DIAGNOSIS — D701 Agranulocytosis secondary to cancer chemotherapy: Secondary | ICD-10-CM | POA: Diagnosis not present

## 2023-10-21 DIAGNOSIS — E876 Hypokalemia: Secondary | ICD-10-CM | POA: Diagnosis not present

## 2023-10-21 LAB — RAD ONC ARIA SESSION SUMMARY
Course Elapsed Days: 36
Plan Fractions Treated to Date: 26
Plan Prescribed Dose Per Fraction: 1.8 Gy
Plan Total Fractions Prescribed: 30
Plan Total Prescribed Dose: 54 Gy
Reference Point Dosage Given to Date: 46.8 Gy
Reference Point Session Dosage Given: 1.8 Gy
Session Number: 26

## 2023-10-21 NOTE — Telephone Encounter (Signed)
 Left the patient a message to see how her appointment went with the Urology team.  Call back number [phone] left for return call.  Sharene EDISON RN, BSN

## 2023-10-21 NOTE — Progress Notes (Signed)
 Misty [name] is a pleasant 68 year old patient with a diagnosis of anal cancer who is receiving chemoradiation. She's completed 26 of 30 treatments to date. She has no previous urologic dysfunction, but since Friday of last week has struggled to empty her bladder. This morning she noted only a few drops of urine and has developed abdominal fullness and discomfort. On treatment imaging her bladder extends past the previously planned landmarks of her bladder. We called [facility] to see if she could be urgently worked in and she will see Dr. Lovie this afternoon.

## 2023-10-22 ENCOUNTER — Emergency Department (HOSPITAL_COMMUNITY): Payer: Medicare (Managed Care)

## 2023-10-22 ENCOUNTER — Other Ambulatory Visit (HOSPITAL_COMMUNITY): Payer: Self-pay

## 2023-10-22 ENCOUNTER — Inpatient Hospital Stay (HOSPITAL_COMMUNITY)
Admission: EM | Admit: 2023-10-22 | Discharge: 2023-11-03 | DRG: 808 | Disposition: A | Discharge status: Skilled Nursing Facility | Payer: Medicare (Managed Care) | Attending: Internal Medicine | Admitting: Internal Medicine

## 2023-10-22 ENCOUNTER — Encounter (HOSPITAL_COMMUNITY): Payer: Self-pay

## 2023-10-22 ENCOUNTER — Other Ambulatory Visit: Payer: Self-pay

## 2023-10-22 ENCOUNTER — Encounter: Payer: Self-pay | Admitting: Hematology

## 2023-10-22 ENCOUNTER — Observation Stay (HOSPITAL_COMMUNITY): Payer: Medicare (Managed Care)

## 2023-10-22 ENCOUNTER — Ambulatory Visit: Payer: Medicare (Managed Care)

## 2023-10-22 ENCOUNTER — Telehealth (HOSPITAL_COMMUNITY): Payer: Self-pay | Admitting: Pharmacy Technician

## 2023-10-22 DIAGNOSIS — K5900 Constipation, unspecified: Secondary | ICD-10-CM | POA: Diagnosis present

## 2023-10-22 DIAGNOSIS — Z66 Do not resuscitate: Secondary | ICD-10-CM | POA: Diagnosis not present

## 2023-10-22 DIAGNOSIS — R6521 Severe sepsis with septic shock: Secondary | ICD-10-CM | POA: Diagnosis not present

## 2023-10-22 DIAGNOSIS — R5081 Fever presenting with conditions classified elsewhere: Secondary | ICD-10-CM | POA: Diagnosis present

## 2023-10-22 DIAGNOSIS — Z531 Procedure and treatment not carried out because of patient's decision for reasons of belief and group pressure: Secondary | ICD-10-CM | POA: Diagnosis present

## 2023-10-22 DIAGNOSIS — Z806 Family history of leukemia: Secondary | ICD-10-CM

## 2023-10-22 DIAGNOSIS — Z79899 Other long term (current) drug therapy: Secondary | ICD-10-CM

## 2023-10-22 DIAGNOSIS — J432 Centrilobular emphysema: Secondary | ICD-10-CM | POA: Diagnosis present

## 2023-10-22 DIAGNOSIS — R339 Retention of urine, unspecified: Secondary | ICD-10-CM | POA: Diagnosis present

## 2023-10-22 DIAGNOSIS — N320 Bladder-neck obstruction: Secondary | ICD-10-CM | POA: Diagnosis present

## 2023-10-22 DIAGNOSIS — F419 Anxiety disorder, unspecified: Secondary | ICD-10-CM | POA: Diagnosis present

## 2023-10-22 DIAGNOSIS — Z90710 Acquired absence of both cervix and uterus: Secondary | ICD-10-CM

## 2023-10-22 DIAGNOSIS — D6481 Anemia due to antineoplastic chemotherapy: Secondary | ICD-10-CM | POA: Diagnosis present

## 2023-10-22 DIAGNOSIS — G9341 Metabolic encephalopathy: Secondary | ICD-10-CM | POA: Diagnosis present

## 2023-10-22 DIAGNOSIS — A419 Sepsis, unspecified organism: Secondary | ICD-10-CM | POA: Diagnosis not present

## 2023-10-22 DIAGNOSIS — Z87891 Personal history of nicotine dependence: Secondary | ICD-10-CM

## 2023-10-22 DIAGNOSIS — G893 Neoplasm related pain (acute) (chronic): Secondary | ICD-10-CM | POA: Diagnosis present

## 2023-10-22 DIAGNOSIS — C210 Malignant neoplasm of anus, unspecified: Secondary | ICD-10-CM | POA: Diagnosis present

## 2023-10-22 DIAGNOSIS — E876 Hypokalemia: Secondary | ICD-10-CM | POA: Diagnosis present

## 2023-10-22 DIAGNOSIS — L308 Other specified dermatitis: Secondary | ICD-10-CM | POA: Diagnosis present

## 2023-10-22 DIAGNOSIS — G4700 Insomnia, unspecified: Secondary | ICD-10-CM | POA: Diagnosis present

## 2023-10-22 DIAGNOSIS — E785 Hyperlipidemia, unspecified: Secondary | ICD-10-CM | POA: Diagnosis present

## 2023-10-22 DIAGNOSIS — R140 Abdominal distension (gaseous): Secondary | ICD-10-CM | POA: Diagnosis not present

## 2023-10-22 DIAGNOSIS — R262 Difficulty in walking, not elsewhere classified: Secondary | ICD-10-CM | POA: Diagnosis present

## 2023-10-22 DIAGNOSIS — Z825 Family history of asthma and other chronic lower respiratory diseases: Secondary | ICD-10-CM

## 2023-10-22 DIAGNOSIS — Z885 Allergy status to narcotic agent status: Secondary | ICD-10-CM

## 2023-10-22 DIAGNOSIS — R4182 Altered mental status, unspecified: Principal | ICD-10-CM

## 2023-10-22 DIAGNOSIS — Z7901 Long term (current) use of anticoagulants: Secondary | ICD-10-CM

## 2023-10-22 DIAGNOSIS — R32 Unspecified urinary incontinence: Secondary | ICD-10-CM | POA: Diagnosis present

## 2023-10-22 DIAGNOSIS — K1230 Oral mucositis (ulcerative), unspecified: Secondary | ICD-10-CM | POA: Diagnosis present

## 2023-10-22 DIAGNOSIS — D709 Neutropenia, unspecified: Secondary | ICD-10-CM | POA: Diagnosis present

## 2023-10-22 DIAGNOSIS — R739 Hyperglycemia, unspecified: Secondary | ICD-10-CM

## 2023-10-22 DIAGNOSIS — D6959 Other secondary thrombocytopenia: Secondary | ICD-10-CM | POA: Diagnosis present

## 2023-10-22 DIAGNOSIS — D61818 Other pancytopenia: Secondary | ICD-10-CM | POA: Diagnosis present

## 2023-10-22 DIAGNOSIS — E039 Hypothyroidism, unspecified: Secondary | ICD-10-CM | POA: Diagnosis present

## 2023-10-22 DIAGNOSIS — R0902 Hypoxemia: Secondary | ICD-10-CM | POA: Diagnosis present

## 2023-10-22 DIAGNOSIS — Z79890 Hormone replacement therapy (postmenopausal): Secondary | ICD-10-CM

## 2023-10-22 DIAGNOSIS — T40605A Adverse effect of unspecified narcotics, initial encounter: Secondary | ICD-10-CM | POA: Diagnosis present

## 2023-10-22 DIAGNOSIS — I2699 Other pulmonary embolism without acute cor pulmonale: Secondary | ICD-10-CM | POA: Diagnosis present

## 2023-10-22 DIAGNOSIS — J440 Chronic obstructive pulmonary disease with acute lower respiratory infection: Secondary | ICD-10-CM | POA: Diagnosis present

## 2023-10-22 DIAGNOSIS — E860 Dehydration: Secondary | ICD-10-CM

## 2023-10-22 DIAGNOSIS — Z8249 Family history of ischemic heart disease and other diseases of the circulatory system: Secondary | ICD-10-CM

## 2023-10-22 DIAGNOSIS — D701 Agranulocytosis secondary to cancer chemotherapy: Principal | ICD-10-CM | POA: Diagnosis present

## 2023-10-22 DIAGNOSIS — T451X5A Adverse effect of antineoplastic and immunosuppressive drugs, initial encounter: Secondary | ICD-10-CM | POA: Diagnosis present

## 2023-10-22 DIAGNOSIS — E871 Hypo-osmolality and hyponatremia: Secondary | ICD-10-CM

## 2023-10-22 DIAGNOSIS — I700 Atherosclerosis of aorta: Secondary | ICD-10-CM | POA: Diagnosis present

## 2023-10-22 DIAGNOSIS — J189 Pneumonia, unspecified organism: Secondary | ICD-10-CM | POA: Diagnosis present

## 2023-10-22 DIAGNOSIS — F32A Depression, unspecified: Secondary | ICD-10-CM | POA: Diagnosis present

## 2023-10-22 DIAGNOSIS — Z789 Other specified health status: Secondary | ICD-10-CM | POA: Diagnosis present

## 2023-10-22 DIAGNOSIS — N289 Disorder of kidney and ureter, unspecified: Secondary | ICD-10-CM

## 2023-10-22 LAB — URINALYSIS, W/ REFLEX TO CULTURE (INFECTION SUSPECTED)
Bilirubin Urine: NEGATIVE
Glucose, UA: NEGATIVE mg/dL
Ketones, ur: NEGATIVE mg/dL
Nitrite: NEGATIVE
Protein, ur: NEGATIVE mg/dL
Specific Gravity, Urine: 1.006 (ref 1.005–1.030)
pH: 6 (ref 5.0–8.0)

## 2023-10-22 LAB — COMPREHENSIVE METABOLIC PANEL WITH GFR
ALT: 17 U/L (ref 0–44)
AST: 17 U/L (ref 15–41)
Albumin: 3 g/dL — ABNORMAL LOW (ref 3.5–5.0)
Alkaline Phosphatase: 61 U/L (ref 38–126)
Anion gap: 7 (ref 5–15)
BUN: 21 mg/dL (ref 8–23)
CO2: 22 mmol/L (ref 22–32)
Calcium: 8.5 mg/dL — ABNORMAL LOW (ref 8.9–10.3)
Chloride: 102 mmol/L (ref 98–111)
Creatinine, Ser: 1.09 mg/dL — ABNORMAL HIGH (ref 0.44–1.00)
GFR, Estimated: 56 mL/min — ABNORMAL LOW (ref >60)
Glucose, Bld: 113 mg/dL — ABNORMAL HIGH (ref 70–99)
Potassium: 3.3 mmol/L — ABNORMAL LOW (ref 3.5–5.1)
Sodium: 131 mmol/L — ABNORMAL LOW (ref 135–145)
Total Bilirubin: 0.7 mg/dL (ref 0.0–1.2)
Total Protein: 5.8 g/dL — ABNORMAL LOW (ref 6.5–8.1)

## 2023-10-22 LAB — CBC WITH DIFFERENTIAL/PLATELET
Abs Immature Granulocytes: 0.02 K/uL (ref 0.00–0.07)
Basophils Absolute: 0 K/uL (ref 0.0–0.1)
Basophils Relative: 1 %
Eosinophils Absolute: 0.4 K/uL (ref 0.0–0.5)
Eosinophils Relative: 20 %
HCT: 24.1 % — ABNORMAL LOW (ref 36.0–46.0)
Hemoglobin: 7.7 g/dL — ABNORMAL LOW (ref 12.0–15.0)
Immature Granulocytes: 1 %
Lymphocytes Relative: 4 %
Lymphs Abs: 0.1 K/uL — ABNORMAL LOW (ref 0.7–4.0)
MCH: 31.4 pg (ref 26.0–34.0)
MCHC: 32 g/dL (ref 30.0–36.0)
MCV: 98.4 fL (ref 80.0–100.0)
Monocytes Absolute: 0.2 K/uL (ref 0.1–1.0)
Monocytes Relative: 8 %
Neutro Abs: 1.3 K/uL — ABNORMAL LOW (ref 1.7–7.7)
Neutrophils Relative %: 66 %
Platelets: 139 K/uL — ABNORMAL LOW (ref 150–400)
RBC: 2.45 MIL/uL — ABNORMAL LOW (ref 3.87–5.11)
RDW: 15.7 % — ABNORMAL HIGH (ref 11.5–15.5)
WBC: 1.9 K/uL — ABNORMAL LOW (ref 4.0–10.5)
nRBC: 0 % (ref 0.0–0.2)

## 2023-10-22 LAB — PROTIME-INR
INR: 1.1 (ref 0.8–1.2)
Prothrombin Time: 14.7 s (ref 11.4–15.2)

## 2023-10-22 LAB — I-STAT CG4 LACTIC ACID, ED: Lactic Acid, Venous: 0.8 mmol/L (ref 0.5–1.9)

## 2023-10-22 MED ORDER — BUTALBITAL-APAP-CAFFEINE 50-325-40 MG PO TABS: 1.0000 | ORAL_TABLET | ORAL | Status: DC | PRN

## 2023-10-22 MED ORDER — TRAZODONE HCL 50 MG PO TABS
25.0000 mg | ORAL_TABLET | Freq: Every evening | ORAL | Status: DC | PRN
  Administered 2023-10-25: 25 mg via ORAL
  Filled 2023-10-22 (×2): qty 1

## 2023-10-22 MED ORDER — ALBUTEROL SULFATE (2.5 MG/3ML) 0.083% IN NEBU: 2.5000 mg | INHALATION_SOLUTION | RESPIRATORY_TRACT | Status: DC | PRN

## 2023-10-22 MED ORDER — IOHEXOL 350 MG/ML SOLN
75.0000 mL | Freq: Once | INTRAVENOUS | Status: AC | PRN
  Administered 2023-10-22: 75 mL via INTRAVENOUS

## 2023-10-22 MED ORDER — LEVOTHYROXINE SODIUM 75 MCG PO TABS
75.0000 ug | ORAL_TABLET | Freq: Every day | ORAL | Status: DC
  Administered 2023-10-23 – 2023-11-03 (×12): 75 ug via ORAL
  Filled 2023-10-22 (×12): qty 1

## 2023-10-22 MED ORDER — HYDROMORPHONE HCL 1 MG/ML IJ SOLN
1.0000 mg | Freq: Once | INTRAMUSCULAR | Status: AC
  Administered 2023-10-22: 1 mg via INTRAVENOUS
  Filled 2023-10-22: qty 1

## 2023-10-22 MED ORDER — SODIUM CHLORIDE 0.9 % IV SOLN: Freq: Once | INTRAVENOUS | Status: AC

## 2023-10-22 MED ORDER — ONDANSETRON HCL 4 MG/2ML IJ SOLN
4.0000 mg | Freq: Four times a day (QID) | INTRAMUSCULAR | Status: DC | PRN
  Administered 2023-10-26 – 2023-10-31 (×7): 4 mg via INTRAVENOUS
  Filled 2023-10-22 (×7): qty 2

## 2023-10-22 MED ORDER — APIXABAN 5 MG PO TABS
10.0000 mg | ORAL_TABLET | Freq: Two times a day (BID) | ORAL | Status: DC
  Administered 2023-10-22 – 2023-10-25 (×8): 10 mg via ORAL
  Filled 2023-10-22 (×5): qty 2
  Filled 2023-10-22: qty 4
  Filled 2023-10-22 (×2): qty 2

## 2023-10-22 MED ORDER — MAGIC MOUTHWASH W/LIDOCAINE
5.0000 mL | Freq: Three times a day (TID) | ORAL | Status: DC | PRN
  Administered 2023-10-23 – 2023-10-29 (×4): 5 mL via ORAL
  Filled 2023-10-22 (×7): qty 5

## 2023-10-22 MED ORDER — ENOXAPARIN SODIUM 40 MG/0.4ML IJ SOSY: 40.0000 mg | PREFILLED_SYRINGE | INTRAMUSCULAR | Status: DC

## 2023-10-22 MED ORDER — ONDANSETRON HCL 4 MG PO TABS
4.0000 mg | ORAL_TABLET | Freq: Four times a day (QID) | ORAL | Status: DC | PRN
Start: 2023-10-22 — End: 2023-11-03
  Administered 2023-10-28: 4 mg via ORAL
  Filled 2023-10-22: qty 1

## 2023-10-22 MED ORDER — LORATADINE 10 MG PO TABS
10.0000 mg | ORAL_TABLET | Freq: Every day | ORAL | Status: DC
  Administered 2023-10-23 – 2023-10-26 (×3): 10 mg via ORAL
  Filled 2023-10-22 (×7): qty 1

## 2023-10-22 MED ORDER — ZINC OXIDE 40 % EX OINT: TOPICAL_OINTMENT | CUTANEOUS | Status: DC | PRN

## 2023-10-22 MED ORDER — MONTELUKAST SODIUM 10 MG PO TABS
10.0000 mg | ORAL_TABLET | Freq: Every day | ORAL | Status: DC
  Administered 2023-10-23 – 2023-11-02 (×11): 10 mg via ORAL
  Filled 2023-10-22 (×12): qty 1

## 2023-10-22 MED ORDER — HYDROMORPHONE HCL 2 MG PO TABS: 2.0000 mg | ORAL_TABLET | ORAL | Status: DC | PRN

## 2023-10-22 MED ORDER — LOPERAMIDE HCL 2 MG PO CAPS: 2.0000 mg | ORAL_CAPSULE | Freq: Once | ORAL | Status: DC

## 2023-10-22 MED ORDER — ACETAMINOPHEN 325 MG PO TABS
650.0000 mg | ORAL_TABLET | Freq: Four times a day (QID) | ORAL | Status: DC | PRN
  Administered 2023-10-22 – 2023-10-26 (×5): 650 mg via ORAL
  Filled 2023-10-22 (×5): qty 2

## 2023-10-22 MED ORDER — CHLORHEXIDINE GLUCONATE CLOTH 2 % EX PADS
6.0000 | MEDICATED_PAD | Freq: Every day | CUTANEOUS | Status: DC
  Administered 2023-10-23 – 2023-11-03 (×13): 6 via TOPICAL

## 2023-10-22 MED ORDER — NYSTATIN 100000 UNIT/ML MT SUSP: 5.0000 mL | Freq: Three times a day (TID) | OROMUCOSAL | Status: DC | PRN

## 2023-10-22 MED ORDER — ACETAMINOPHEN 650 MG RE SUPP: 650.0000 mg | Freq: Four times a day (QID) | RECTAL | Status: DC | PRN

## 2023-10-22 MED ORDER — BUDESON-GLYCOPYRROL-FORMOTEROL 160-9-4.8 MCG/ACT IN AERO
2.0000 | INHALATION_SPRAY | Freq: Two times a day (BID) | RESPIRATORY_TRACT | Status: DC
  Administered 2023-10-23 – 2023-11-03 (×23): 2 via RESPIRATORY_TRACT
  Filled 2023-10-22: qty 5.9

## 2023-10-22 MED ORDER — ONDANSETRON HCL 4 MG/2ML IJ SOLN
4.0000 mg | Freq: Once | INTRAMUSCULAR | Status: AC
  Administered 2023-10-22: 4 mg via INTRAVENOUS
  Filled 2023-10-22: qty 2

## 2023-10-22 MED ORDER — SODIUM CHLORIDE 0.9 % IV BOLUS
1000.0000 mL | Freq: Once | INTRAVENOUS | Status: AC
  Administered 2023-10-22: 1000 mL via INTRAVENOUS

## 2023-10-22 MED ORDER — MONTELUKAST SODIUM 10 MG PO TABS: 10.0000 mg | ORAL_TABLET | Freq: Every day | ORAL | Status: DC

## 2023-10-22 MED ORDER — ZINC OXIDE 40 % EX OINT
TOPICAL_OINTMENT | Freq: Two times a day (BID) | CUTANEOUS | Status: DC
  Administered 2023-10-24: 1 via TOPICAL
  Filled 2023-10-22 (×3): qty 57

## 2023-10-22 MED ORDER — APIXABAN 5 MG PO TABS: 5.0000 mg | ORAL_TABLET | Freq: Two times a day (BID) | ORAL | Status: DC

## 2023-10-22 NOTE — H&P (Signed)
 History and Physical  Misty [name] FMW:[id number] DOB: 12-Dec-1955 DOA: 10/22/2023  PCP: Dorcus [name], MD   Chief Complaint: Weakness, confusion  HPI: Misty [name] is a 68 y.o. 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  History is provided by the patient as well as her good friend who is her HCPOA and at the bedside this morning.  They tell me that overall the patient has been having a rough time with her chemoradiation, unfortunately as expected.  Over the last week, she has been having increasing rectal pain, and in the last 48 hours has had a couple of episodes of stool incontinence which is not a problem she has had frequently.  She states that a few days ago on 7/25, her radiation oncologist Dr. Dewey increased her Dilaudid  from 2 mg to 4 mg every 4-6 hours as needed.  In the meantime, patient was seen urgently in the urology office yesterday and had Foley catheter placed due to urinary retention.  Her friend Misty [name] states that since increasing the medication, she has noticed the patient being a little bit more loopybut it was just intermittent, with some slow responses.  However since the patient has not been doing well, she stated over at the patient's home last night.  States that in the middle of the night she heard the patient making noise in her bedroom and went to check on her.  Found the patient to be kind of sweaty, very confused, to the point of being incoherent.  This lasted for just a few minutes and then improved.  They deny any cough, fever.  ER provider describes a fever of up to 101 at home, however they deny this.  She has not had any significant diarrhea, no chest pain or significant shortness of breath.  Apparently after receiving a dose of IV Dilaudid  here in the emergency department, the patient became  hypoxic and has been on 4 L nasal cannula oxygen.  Review of Systems: Please see HPI for pertinent positives and negatives. A complete 10 system review of systems are otherwise negative.  Past Medical History:  Diagnosis Date   Anxiety    Bipolar disorder (HCC)    Dr. Vincente   COPD (chronic obstructive pulmonary disease) (HCC) 2006   Depression    Hypothyroidism    Rheumatic fever    as a child, age 11yo   Urinary tract infection    hx/o   Wears glasses    Past Surgical History:  Procedure Laterality Date   ABDOMINAL HYSTERECTOMY     APPENDECTOMY     CESAREAN SECTION     UTERINE FIBROID SURGERY     Social History:  reports that she quit smoking about 8 years ago. Her smoking use included cigarettes. She started smoking about 48 years ago. She has a 10 pack-year smoking history. She has never used smokeless tobacco. She reports current alcohol use. She reports that she does not use drugs.  Allergies  Allergen Reactions   Codeine Nausea Only and Other (See Comments)    skin crawling  Can take hydrocodone      Family History  Problem Relation Age of Onset   Allergies Sister    Asthma Sister    Heart disease Mother    Leukemia Mother      Prior to Admission medications   Medication Sig Start Date End Date  Taking? Authorizing Provider  acetaminophen  (TYLENOL ) 500 MG tablet Take 1,000 mg by mouth every 4 (four) hours as needed for moderate pain.    [provider]  albuterol  (VENTOLIN  HFA) 108 (90 Base) MCG/ACT inhaler Inhale 2 puffs into the lungs every 6 (six) hours as needed for wheezing or shortness of breath. 10/10/22   Hunsucker, Donnice SAUNDERS, MD  ALBUTEROL  IN Albuterol  inhaler    [provider]  atorvastatin  (LIPITOR) 20 MG tablet Take 20 mg by mouth daily.  10/26/14   [provider]  baclofen  (LIORESAL ) 10 MG tablet TAKE 1 TABLET THREE TIMES DAILY 12/15/18   [provider]  buPROPion  (WELLBUTRIN  XL) 150 MG 24 hr tablet Take 300 mg by  mouth every morning.  09/21/19   [provider]  dexamethasone  (DECADRON ) 4 MG tablet Take 1 tablet by mouth in the morning with meals, for 3-5 days after chemo 09/22/23   Burton, Lacie K, NP  diazepam  (VALIUM ) 5 MG tablet Take 1 tablet by mouth in the morning and at bedtime. 08/29/21   [provider]  Docusate Sodium 100 MG capsule Take 100 mg by mouth.    [provider]  fluticasone  (FLONASE) 50 MCG/ACT nasal spray Place 2 sprays into both nostrils.    [provider]  Fluticasone -Umeclidin-Vilant (TRELEGY ELLIPTA ) 100-62.5-25 MCG/ACT AEPB Inhale 1 puff into the lungs daily. 07/12/23   Hunsucker, Donnice SAUNDERS, MD  gabapentin  (NEURONTIN ) 600 MG tablet Take 300-900 mg by mouth 2 (two) times daily. Take 0.5 tablet (300 mg) in the morning and Take 1.5 (900 mg) in the evening 09/28/19   [provider]  guaiFENesin  (MUCINEX ) 600 MG 12 hr tablet Take 600 mg by mouth 2 (two) times daily as needed for cough.    [provider]  HYDROmorphone  (DILAUDID ) 2 MG tablet Take 1 tablet (2 mg total) by mouth every 4 (four) hours as needed for severe pain (pain score 7-10). 10/03/23   [name], MD  ipratropium-albuterol  (DUONEB) 0.5-2.5 (3) MG/3ML SOLN Take 3 mLs by nebulization every 4 (four) hours as needed. 10/09/19   Lue Elsie BROCKS, MD  lamoTRIgine  (LAMICTAL ) 200 MG tablet Take 200 mg by mouth at bedtime.  08/09/19   [provider]  levothyroxine  (SYNTHROID , LEVOTHROID) 75 MCG tablet Take 0.75 mcg by mouth daily.    [provider]  loratadine  (CLARITIN ) 10 MG tablet Take 10 mg by mouth daily.    [provider]  LORazepam  (ATIVAN ) 0.5 MG tablet Take 1-2 tablets (0.5-1 mg total) by mouth every 8 (eight) hours as needed for anxiety (or nausea). 10/13/23   [name], MD  magic mouthwash (nystatin , lidocaine , diphenhydrAMINE, alum & mag hydroxide) suspension Swish and swallow 5 mLs by mouth 3 (three) times daily as needed for mouth pain.  09/25/23   Boscia, Heather E, NP  montelukast  (SINGULAIR ) 10 MG tablet Take 1 tablet (10 mg total) by mouth at bedtime. 06/17/23   Hunsucker, Donnice SAUNDERS, MD  Multiple Vitamins-Minerals (CENTRUM ADULT PO) Take by mouth. 02/22/21   [provider]  naltrexone (DEPADE) 50 MG tablet Take 50 mg by mouth. 02/12/23 04/28/24  [provider]  ondansetron  (ZOFRAN ) 8 MG tablet Take 1 tablet (8 mg total) by mouth every 8 (eight) hours as needed for nausea or vomiting. Do not use for 72 hours following chemotherapy. 10/06/23   Burton, Lacie K, NP  polyethylene glycol (MIRALAX) 17 g packet MiraLax    [provider]  promethazine  (PHENERGAN ) 25 MG tablet  Take 1 tablet (25 mg total) by mouth every 6 (six) hours as needed for nausea or vomiting. 10/13/23   [name], MD  Specialty Vitamins Products (HAIR NOURISHING SUPPLEMENT PO) Nutrafol hair supplement.  4 tabs daily. 02/13/23   [provider]  valACYclovir  (VALTREX ) 500 MG tablet Take 500 mg by mouth daily.    [provider]    Physical Exam: BP (!) 106/39 (BP Location: Left Arm)   Pulse 88   Temp 98.6 F (37 C) (Oral)   Resp 20   SpO2 98%  General:  Alert, oriented, calm, in no acute distress, on 4 L nasal cannula oxygen saturating 98 to 100%.  Speaking full sentences, with no cough.  She looks chronically ill.  Her friend Misty [name] is at the bedside. Cardiovascular: RRR, no murmurs or rubs, no peripheral edema  Respiratory: clear to auscultation bilaterally, no wheezes, no crackles  Abdomen: soft, nontender, nondistended, normal bowel tones heard  Skin: dry, no rashes  GU: Foley catheter in place with clear yellow urine Musculoskeletal: no joint effusions, normal range of motion  Psychiatric: appropriate affect, normal speech  Neurologic: extraocular muscles intact, clear speech, moving all extremities with intact sensorium         Labs on Admission:  Basic Metabolic Panel: Recent Labs  Lab 10/20/23 0941  10/22/23 0405  NA 138 131*  K 3.6 3.3*  CL 103 102  CO2 29 22  GLUCOSE 101* 113*  BUN 26* 21  CREATININE 1.24* 1.09*  CALCIUM  9.1 8.5*   Liver Function Tests: Recent Labs  Lab 10/20/23 0941 10/22/23 0405  AST 11* 17  ALT 15 17  ALKPHOS 70 61  BILITOT 0.4 0.7  PROT 6.5 5.8*  ALBUMIN 3.6 3.0*   No results for input(s): LIPASE, AMYLASE in the last 168 hours. No results for input(s): AMMONIA in the last 168 hours. CBC: Recent Labs  Lab 10/20/23 0941 10/22/23 0405  WBC 4.1 1.9*  NEUTROABS 2.7 1.3*  HGB 8.8* 7.7*  HCT 26.9* 24.1*  MCV 96.1 98.4  PLT 172 139*   Cardiac Enzymes: No results for input(s): CKTOTAL, CKMB, CKMBINDEX, TROPONINI in the last 168 hours. BNP (last 3 results) No results for input(s): BNP in the last 8760 hours.  ProBNP (last 3 results) No results for input(s): PROBNP in the last 8760 hours.  CBG: No results for input(s): GLUCAP in the last 168 hours.  Radiological Exams on Admission: CT Head Wo Contrast Result Date: 10/22/2023 CLINICAL DATA:  68 year old female with altered mental status. Stage IV anal rectal cancer. EXAM: CT HEAD WITHOUT CONTRAST TECHNIQUE: Contiguous axial images were obtained from the base of the skull through the vertex without intravenous contrast. RADIATION DOSE REDUCTION: This exam was performed according to the departmental dose-optimization program which includes automated exposure control, adjustment of the mA and/or kV according to patient size and/or use of iterative reconstruction technique. COMPARISON:  Head CT 11/27/2014. FINDINGS: Brain: No midline shift, ventriculomegaly, mass effect, evidence of mass lesion, intracranial hemorrhage or evidence of cortically based acute infarction. Patchy mild for age bilateral cerebral white matter hypodensity. Otherwise maintained gray-white differentiation. No cortical encephalomalacia identified. Vascular: No suspicious intracranial vascular hyperdensity.  Calcified atherosclerosis at the skull base. Skull: Intact.  No acute or suspicious osseous lesion identified. Sinuses/Orbits: Visualized paranasal sinuses and mastoids are stable and well aerated. Other: Visualized orbits and scalp soft tissues are within normal limits. IMPRESSION: No acute intracranial abnormality by noncontrast CT. Mild for age cerebral white matter changes, most commonly  due to chronic small vessel disease. Electronically Signed   By: VEAR Hurst M.D.   On: 10/22/2023 07:19   DG Chest 2 View Result Date: 10/22/2023 EXAM: 2 VIEW(S) XRAY OF THE CHEST 10/22/2023 04:41:00 AM COMPARISON: 10/08/2019 CLINICAL HISTORY: Fever. Chemo, fever. FINDINGS: LUNGS AND PLEURA: No focal pulmonary opacity. No pulmonary edema. No pleural effusion. No pneumothorax. HEART AND MEDIASTINUM: No acute abnormality of the cardiac and mediastinal silhouettes. Aortic atherosclerotic calcification. BONES AND SOFT TISSUES: Remote fracture deformity involving the midshaft of right clavicle. No acute osseous abnormality. IMPRESSION: 1. No acute cardiopulmonary pathology. Electronically signed by: Waddell Calk MD 10/22/2023 05:46 AM EDT RP Workstation: [id number]   Assessment/Plan  KAMESHA HERNE is a 68 y.o. 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   Confusion and possible fever-as detailed above, patient has a plethora of complaints.  Much of what she describes is likely expected given her undergoing chemoradiation.  ER provider reports fever at home, however patient denies this and states that she was told she had a fever after she arrived in the ER.  Differential for her presentation is quite broad, with hypoxia, waxing and waning mental status.  Could be result of acute infection, medication effect from recently increased Dilaudid  or even PE.  She may  possibly have some chronic hypoxia related to her narcotic use, or some pulmonary abnormality. -Observation admission -Regular diet -Continue p.o. Dilaudid  at previous dose of 2 mg as needed -Follow-up blood culture -Urinalysis looks unremarkable -Chest x-ray and head CT without acute findings -Given her lower extremity edema, hypoxia, intermittent symptoms we will check stat CT angiogram to rule out PE and evaluate for pulmonary consolidation -Given no documented fever and no localizing symptoms, will hold off on antibiotics for the time being -Discussed with her oncologist Dr. Lanny who will follow in the hospital as well  Chronic home medications for the following conditions will be resumed as appropriate once reconciled by pharmacy staff: Hyperlipidemia-Lipitor Anxiety-Valium  twice daily Depression-Wellbutrin  COPD-without obvious evidence of exacerbation, will resume Trelegy and Singulair  Hypothyroidism-Synthroid  Mucositis-Magic mouthwash  DVT prophylaxis: Lovenox      Code Status: Full Code  Consults called: Oncology Dr. Lanny  Admission status: Observation  Time spent: 56 minutes  Jonesha Tsuchiya CHRISTELLA Gail MD [facility] Pager [phone]  If 7PM-7AM, please contact night-coverage [email or web address] Password [email or web address]  10/22/2023, 9:07 AM

## 2023-10-22 NOTE — Telephone Encounter (Signed)
 Patient Product/process development scientist completed.    The patient is insured through [employer]. Patient has Medicare and is not eligible for a copay card, but may be able to apply for patient assistance or Medicare RX Payment Plan (Patient Must reach out to their plan, if eligible for payment plan), if available.    Ran test claim for Eliquis  Starter Pack and the current 30 day co-pay is $47.00.   This test claim was processed through [facility]- copay amounts may vary at other pharmacies due to pharmacy/plan contracts, or as the patient moves through the different stages of their insurance plan.     Reyes Sharps, CPHT Pharmacy Technician III Certified Patient Advocate [facility] Patient Advocate Team Direct Number: [phone]  Fax: [phone]

## 2023-10-22 NOTE — ED Notes (Signed)
 Patient incontinent to stool x2. Patient was cleaned. Soiled linens removed. Patient is now resting in bed. Family friend is at bedside. Patient started on 3L [place] due to low oxygen saturation after receiving pain medication.

## 2023-10-22 NOTE — ED Provider Notes (Signed)
  EMERGENCY DEPARTMENT AT [facility] Provider Note   CSN: [id number] Arrival date & time: 10/22/23  9664     Patient presents with: Rectal Pain and Fever   Misty [name] is a 68 y.o. female.   The history is provided by the patient and the EMS personnel.  Fever  She has history of COPD, bipolar disorder, rectal cancer and comes in by ambulance because of worsening of anal pain, fecal incontinence and development of fever.  Temperature is reported to have been over 101 degrees at home.  She denies nausea or vomiting.  She does have a Foley catheter which was placed yesterday.  She is currently getting radiation treatment for her cancer.    Prior to Admission medications   Medication Sig Start Date End Date Taking? Authorizing Provider  acetaminophen  (TYLENOL ) 500 MG tablet Take 1,000 mg by mouth every 4 (four) hours as needed for moderate pain.    [provider]  albuterol  (VENTOLIN  HFA) 108 (90 Base) MCG/ACT inhaler Inhale 2 puffs into the lungs every 6 (six) hours as needed for wheezing or shortness of breath. 10/10/22   Hunsucker, Donnice SAUNDERS, MD  ALBUTEROL  IN Albuterol  inhaler    [provider]  atorvastatin  (LIPITOR) 20 MG tablet Take 20 mg by mouth daily.  10/26/14   [provider]  baclofen  (LIORESAL ) 10 MG tablet TAKE 1 TABLET THREE TIMES DAILY 12/15/18   [provider]  buPROPion  (WELLBUTRIN  XL) 150 MG 24 hr tablet Take 300 mg by mouth every morning.  09/21/19   [provider]  dexamethasone  (DECADRON ) 4 MG tablet Take 1 tablet by mouth in the morning with meals, for 3-5 days after chemo 09/22/23   Burton, Lacie K, NP  diazepam  (VALIUM ) 5 MG tablet Take 1 tablet by mouth in the morning and at bedtime. 08/29/21   [provider]  Docusate Sodium 100 MG capsule Take 100 mg by mouth.    [provider]  fluticasone  (FLONASE) 50 MCG/ACT nasal spray Place 2 sprays into both nostrils.    [provider]  Fluticasone -Umeclidin-Vilant (TRELEGY ELLIPTA ) 100-62.5-25 MCG/ACT AEPB Inhale 1 puff into the lungs daily. 07/12/23   Hunsucker, Donnice SAUNDERS, MD  gabapentin  (NEURONTIN ) 600 MG tablet Take 300-900 mg by mouth 2 (two) times daily. Take 0.5 tablet (300 mg) in the morning and Take 1.5 (900 mg) in the evening 09/28/19   [provider]  guaiFENesin  (MUCINEX ) 600 MG 12 hr tablet Take 600 mg by mouth 2 (two) times daily as needed for cough.    [provider]  HYDROmorphone  (DILAUDID ) 2 MG tablet Take 1 tablet (2 mg total) by mouth every 4 (four) hours as needed for severe pain (pain score 7-10). 10/03/23   [name], MD  ipratropium-albuterol  (DUONEB) 0.5-2.5 (3) MG/3ML SOLN Take 3 mLs by nebulization every 4 (four) hours as needed. 10/09/19   Lue Elsie BROCKS, MD  lamoTRIgine  (LAMICTAL ) 200 MG tablet Take 200 mg by mouth at bedtime.  08/09/19   [provider]  levothyroxine  (SYNTHROID , LEVOTHROID) 75 MCG tablet Take 0.75 mcg by mouth daily.    [provider]  loratadine  (CLARITIN ) 10 MG tablet Take 10 mg by mouth daily.    [provider]  LORazepam  (ATIVAN ) 0.5 MG tablet Take 1-2 tablets (0.5-1 mg total) by mouth every 8 (eight) hours as needed for anxiety (or nausea). 10/13/23   [name], MD  magic mouthwash (nystatin , lidocaine , diphenhydrAMINE, alum & mag hydroxide) suspension  Swish and swallow 5 mLs by mouth 3 (three) times daily as needed for mouth pain. 09/25/23   Boscia, Heather E, NP  montelukast  (SINGULAIR ) 10 MG tablet Take 1 tablet (10 mg total) by mouth at bedtime. 06/17/23   Hunsucker, Donnice SAUNDERS, MD  Multiple Vitamins-Minerals (CENTRUM ADULT PO) Take by mouth. 02/22/21   [provider]  naltrexone (DEPADE) 50 MG tablet Take 50 mg by mouth. 02/12/23 04/28/24  [provider]  ondansetron  (ZOFRAN ) 8 MG tablet Take 1 tablet (8 mg total) by mouth every 8 (eight) hours as needed for nausea or vomiting. Do not use for 72  hours following chemotherapy. 10/06/23   Burton, Lacie K, NP  polyethylene glycol (MIRALAX) 17 g packet MiraLax    [provider]  promethazine  (PHENERGAN ) 25 MG tablet Take 1 tablet (25 mg total) by mouth every 6 (six) hours as needed for nausea or vomiting. 10/13/23   [name], MD  Specialty Vitamins Products (HAIR NOURISHING SUPPLEMENT PO) Nutrafol hair supplement.  4 tabs daily. 02/13/23   [provider]  valACYclovir  (VALTREX ) 500 MG tablet Take 500 mg by mouth daily.    [provider]    Allergies: Codeine    Review of Systems  Constitutional:  Positive for fever.  All other systems reviewed and are negative.   Updated Vital Signs BP 107/73   Pulse 99   Temp 99.4 F (37.4 C) (Oral)   Resp 18   SpO2 97%   Physical Exam Vitals and nursing note reviewed.   68 year old female, resting comfortably and in no acute distress. Vital signs are normal. Oxygen saturation is 97%, which is normal. Head is normocephalic and atraumatic. PERRLA, EOMI. Oropharynx is clear. Neck is nontender and supple without adenopathy. Back is nontender and there is no CVA tenderness. Lungs are clear without rales, wheezes, or rhonchi. Chest is nontender. Heart has regular rate and rhythm without murmur. Abdomen is soft, flat, nontender. Extremities have no cyanosis or edema, full range of motion is present. Skin: Erythema noted throughout the pubic area and perianal area with desquamation consistent with effects of radiation treatment. Neurologic: Awake and alert, oriented to person but not place or time, moves all extremities equally.  (all labs ordered are listed, but only abnormal results are displayed) Labs Reviewed  CBC WITH DIFFERENTIAL/PLATELET - Abnormal; Notable for the following components:      Result Value   WBC 1.9 (*)    RBC 2.45 (*)    Hemoglobin 7.7 (*)    HCT 24.1 (*)    RDW 15.7 (*)    Platelets 139 (*)    Neutro Abs 1.3 (*)    Lymphs Abs 0.1 (*)     All other components within normal limits  CULTURE, BLOOD (ROUTINE X 2)  CULTURE, BLOOD (ROUTINE X 2)  COMPREHENSIVE METABOLIC PANEL WITH GFR  PROTIME-INR  URINALYSIS, W/ REFLEX TO CULTURE (INFECTION SUSPECTED)  I-STAT CG4 LACTIC ACID, ED    EKG: EKG Interpretation Date/Time:  Wednesday October 22 2023 04:54:49 EDT Ventricular Rate:  95 PR Interval:    QRS Duration:  98 QT Interval:  367 QTC Calculation: 462 R Axis:   11  Text Interpretation: Sinus rhythm Low voltage, precordial leads Borderline T abnormalities, anterior leads Minimal ST elevation, inferior leads When compared with ECG of 10/08/2019, No significant change was found Confirmed by Raford Lenis ([id number]) on 10/22/2023 4:58:51 AM  Radiology: No results found.   Procedures   Medications Ordered in the ED  HYDROmorphone  (DILAUDID ) injection 1 mg (has no administration in time range)  ondansetron  (ZOFRAN ) injection 4 mg (has no administration in time range)                                    Medical Decision Making Amount and/or Complexity of Data Reviewed Labs: ordered. Radiology: ordered.  Risk Prescription drug management.   Increased rectal pain and patient with known anal cancer, possible fever at home need to initiate evolving sepsis pathway.  I have ordered hydromorphone  for pain.  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  Radiation began on 09/17/2023, and she has completed 24 treatments.  I have reviewed her electrocardiogram, my interpretation is borderline ST-T changes unchanged from prior.  Chest x-ray shows no evidence of pneumonia.  I have independently viewed the images, and agree with the radiologist's interpretation.  I have reviewed her laboratory tests, and my interpretation is pancytopenia likely related to chemotherapy but with  adequate absolute neutrophil count, hyponatremia which is not felt to be clinically significant, mild hypokalemia, elevated random glucose, renal insufficiency improved compared with most recent value, normal lactic acid level, urinalysis showing no evidence of UTI.  Family member has arrived, and she actually states that patient had not been running a fever but there is concerned because of increased confusion.  She has been increasing her doses of narcotics, but change in mentation was significant over the last 24 hours.  I have ordered CT of head, she will need to be admitted.  I have reviewed her CT scan and see no evidence of any acute process with radiologist interpretation pending.  I have sent a securechat to Dr. Roxane of [facility] to admit the patient.     Final diagnoses:  Altered mental status, unspecified altered mental status type  Pancytopenia (HCC)  Hyponatremia  Hypokalemia  Elevated random blood glucose level  Renal insufficiency    ED Discharge Orders     None          Raford Lenis, MD 10/22/23 [phone]

## 2023-10-22 NOTE — ED Notes (Signed)
 Patient was given some mouth swabs due to dryness in the oral cavity.

## 2023-10-22 NOTE — ED Triage Notes (Addendum)
 Patient from home via EMS complaining of chronic pan and fecal incontinence. Patient is stage 4 rectal cancer. Patient received radiation yesterday and also had a catheter placed yesterday.   Patient is alert and oriented x4 with basic questions, but also appears to be somewhat confused. States she was given pain medication at 11pm.

## 2023-10-22 NOTE — Plan of Care (Signed)
  Problem: Clinical Measurements: Goal: Ability to maintain clinical measurements within normal limits will improve Outcome: Progressing Goal: Will remain free from infection Outcome: Progressing Goal: Diagnostic test results will improve Outcome: Progressing   Problem: Activity: Goal: Risk for activity intolerance will decrease Outcome: Progressing   Problem: Nutrition: Goal: Adequate nutrition will be maintained Outcome: Progressing   Problem: Clinical Measurements: Goal: Respiratory complications will improve Outcome: Not Progressing

## 2023-10-22 NOTE — Progress Notes (Signed)
 PHARMACY - ANTICOAGULATION CONSULT NOTE  Pharmacy Consult for Eliquis  Indication: acute pulmonary embolus  Allergies  Allergen Reactions   Codeine Nausea Only and Other (See Comments)    skin crawling  Can take hydrocodone      Patient Measurements:    Vital Signs: Temp: 99.1 F (37.3 C) (07/30 1112) Temp Source: Oral (07/30 1112) BP: 131/53 (07/30 1112) Pulse Rate: 93 (07/30 1112)  Labs: Recent Labs    10/20/23 0941 10/22/23 0405  HGB 8.8* 7.7*  HCT 26.9* 24.1*  PLT 172 139*  LABPROT  --  14.7  INR  --  1.1  CREATININE 1.24* 1.09*    Estimated Creatinine Clearance: 47.3 mL/min (A) (by C-G formula based on SCr of 1.09 mg/dL (H)).   Medical History: Past Medical History:  Diagnosis Date   Anxiety    Bipolar disorder (HCC)    Dr. 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 Chest CTA on 10/22/23 resulted back with acute PE with no evidence of RHS.  Pharmacy has been consulted to dose Eliquis  for VTE treatment.  Today, 10/22/2023: - hgb low 7.7, plts 139K  (recent chemo treatment) - scr 1.09 - no bleeding documented    Plan:  - Eliquis  10 mg bid x7 days, then 5mg  bid - pharmacy will sign off.  Re-consult us  if need further assistance  Pearlie Nies P 10/22/2023,11:19 AM

## 2023-10-22 NOTE — ED Notes (Signed)
With Ct 

## 2023-10-22 NOTE — Progress Notes (Addendum)
 Misty [name]   DOB:1955/12/17   FM#:[id number]      ASSESSMENT & PLAN:  Misty [name] is a 68 year old female patient with oncologic history significant for anal cancer.  She is being admitted 7/30 with complaints of fever, weakness and confusion.  Encephalopathy Weakness Confusion - Patient seen awake and alert on stretcher bed in ED.  Patient thinks that her Dilaudid  was too much causing her to feel loopy. 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 - Started concurrent chemoradiation with mitomycin  and 5-FU on 09/15/2023 - On radiation therapy, radiation oncology managing - Follow closely with medical oncology/Dr. Lanny  Rectal pain Skin irritation due to RT - Secondary to oncologic therapy and malignancy - Continue pain medication as ordered  Pancytopenia: - Secondary to recent chemoradiation Leukopenia-WBC low 1.9 Anemia-hemoglobin 7.7.  Recommend PRBC transfusion for Hgb <7.0. Thrombocytopenia-mild, platelets 139K-no intervention required - Continue to monitor CBC with differential  History of urinary retention - Foley catheter was placed by urologist 2 days ago - Urine appears yellow with no hematuria noted today    Code Status Full  Subjective:  Patient seen awake and alert laying on stretcher bed in NAD with friends from church [employer] at bedside.  Patient reports that she began to feel loopy since taking increasing doses of Dilaudid  prescribed by radiation oncology.  Patient states rectal area has been very painful.  Denies chest pain or shortness of breath or dizziness.  No other acute distress is noted at this time.  Objective:   Intake/Output Summary (Last 24 hours) at 10/22/2023 1026 Last data filed at 10/22/2023 0402 Gross per 24 hour   Intake --  Output 1000 ml  Net -1000 ml     PHYSICAL EXAMINATION: ECOG PERFORMANCE STATUS: 3 - Symptomatic, >50% confined to bed  Vitals:   10/22/23 0600 10/22/23 0736  BP: (!) 113/46 (!) 106/39  Pulse: 91 88  Resp: 15 20  Temp:  98.6 F (37 C)  SpO2: 94% 98%   There were no vitals filed for this visit.  GENERAL: alert, + ill-appearing SKIN: + Pale skin color, + rectal area irritation from RT EYES: normal, conjunctiva are pink and non-injected, sclera clear OROPHARYNX: no exudate, no erythema and lips, buccal mucosa, and tongue normal  NECK: supple, thyroid normal size, non-tender, without nodularity LYMPH: no palpable lymphadenopathy in the cervical, axillary or inguinal LUNGS: clear to auscultation and percussion with normal breathing effort HEART: regular rate & rhythm and no murmurs and no lower extremity edema ABDOMEN: abdomen soft, non-tender and normal bowel sounds MUSCULOSKELETAL: no cyanosis of digits and no clubbing  PSYCH: alert & oriented x 3 with fluent speech NEURO: no focal motor/sensory deficits   All questions were answered. The patient knows to call the clinic with any problems, questions or concerns.   The total time spent in the appointment was 40 minutes encounter with patient including review of chart and various tests results, discussions about plan of care and coordination of care plan  Olam JINNY Brunner, NP 10/22/2023 10:26 AM    Labs Reviewed:  Lab Results  Component Value Date   WBC 1.9 (L) 10/22/2023   HGB 7.7 (L) 10/22/2023   HCT 24.1 (L) 10/22/2023   MCV 98.4 10/22/2023   PLT 139 (L) 10/22/2023   Recent Labs    10/13/23 1119  10/20/23 0941 10/22/23 0405  NA 136 138 131*  K 4.2 3.6 3.3*  CL 104 103 102  CO2 26 29 22   GLUCOSE 121* 101* 113*  BUN 18 26* 21  CREATININE 0.90 1.24* 1.09*  CALCIUM  9.3 9.1 8.5*  GFRNONAA >60 48* 56*  PROT 6.8 6.5 5.8*  ALBUMIN 3.8 3.6 3.0*  AST 19 11* 17  ALT 23 15 17   ALKPHOS 96 70 61  BILITOT  0.2 0.4 0.7    Studies Reviewed:  CT Head Wo Contrast Result Date: 10/22/2023 CLINICAL DATA:  68 year old female with altered mental status. Stage IV anal rectal cancer. EXAM: CT HEAD WITHOUT CONTRAST TECHNIQUE: Contiguous axial images were obtained from the base of the skull through the vertex without intravenous contrast. RADIATION DOSE REDUCTION: This exam was performed according to the departmental dose-optimization program which includes automated exposure control, adjustment of the mA and/or kV according to patient size and/or use of iterative reconstruction technique. COMPARISON:  Head CT 11/27/2014. FINDINGS: Brain: No midline shift, ventriculomegaly, mass effect, evidence of mass lesion, intracranial hemorrhage or evidence of cortically based acute infarction. Patchy mild for age bilateral cerebral white matter hypodensity. Otherwise maintained gray-white differentiation. No cortical encephalomalacia identified. Vascular: No suspicious intracranial vascular hyperdensity. Calcified atherosclerosis at the skull base. Skull: Intact.  No acute or suspicious osseous lesion identified. Sinuses/Orbits: Visualized paranasal sinuses and mastoids are stable and well aerated. Other: Visualized orbits and scalp soft tissues are within normal limits. IMPRESSION: No acute intracranial abnormality by noncontrast CT. Mild for age cerebral white matter changes, most commonly due to chronic small vessel disease. Electronically Signed   By: VEAR Hurst M.D.   On: 10/22/2023 07:19   DG Chest 2 View Result Date: 10/22/2023 EXAM: 2 VIEW(S) XRAY OF THE CHEST 10/22/2023 04:41:00 AM COMPARISON: 10/08/2019 CLINICAL HISTORY: Fever. Chemo, fever. FINDINGS: LUNGS AND PLEURA: No focal pulmonary opacity. No pulmonary edema. No pleural effusion. No pneumothorax. HEART AND MEDIASTINUM: No acute abnormality of the cardiac and mediastinal silhouettes. Aortic atherosclerotic calcification. BONES AND SOFT TISSUES: Remote fracture deformity  involving the midshaft of right clavicle. No acute osseous abnormality. IMPRESSION: 1. No acute cardiopulmonary pathology. Electronically signed by: Waddell Calk MD 10/22/2023 05:46 AM EDT RP Workstation: [id number]   IR PICC PLACEMENT RIGHT >5 YRS INC IMG GUIDE Result Date: 10/13/2023 INDICATION: 68 year old female with history of anal cancer presents in need of durable venous access. Peripherally inserted central catheter requested. EXAM: PICC LINE PLACEMENT WITH ULTRASOUND AND FLUOROSCOPIC GUIDANCE MEDICATIONS: 5 mL 1% lidocaine  ANESTHESIA/SEDATION: None FLUOROSCOPY: Radiation Exposure Index (as provided by the fluoroscopic device): 1.0 mGy Kerma COMPLICATIONS: None immediate. PROCEDURE: The patient was advised of the possible risks and complications and agreed to undergo the procedure. The patient was then brought to the angiographic suite for the procedure. The right arm was prepped with chlorhexidine , draped in the usual sterile fashion using maximum barrier technique (cap and mask, sterile gown, sterile gloves, large sterile sheet, hand hygiene and cutaneous antisepsis) and infiltrated locally with 1% Lidocaine . Ultrasound demonstrated patency of the right basilic vein, and this was documented with an image. Under real-time ultrasound guidance, this vein was accessed with a 21 gauge micropuncture needle and image documentation was performed. A 0.018 wire was introduced in to the vein. Over this, a 5 [place] dual lumen power PICC was advanced to the lower SVC/right atrial junction. Fluoroscopy during the procedure and fluoro spot radiograph confirms appropriate catheter position. The catheter was flushed and covered with a sterile dressing. Catheter  length: 34 IMPRESSION: Successful right arm Power, dual lumen PICC line placement with ultrasound and fluoroscopic guidance. The catheter is ready for use. Performed by: Kacie Matthews PA-C Electronically Signed   By: Cordella Banner   On: 10/13/2023 11:13    Addendum I have seen the patient, examined her. I agree with the assessment and and plan and have edited the notes.   Patient is well-known to me, she is on last week of concurrent chemoradiation, was admitted for confusion, weakness, and fever.  She has stool and urine incontinence, has a Foley in.  Perianal dermatitis is moderate.  Moderate anemia and mild neutropenia, thrombocytopenia are secondary to chemotherapy last week.  Please consider blood transfusion if hemoglobin less than 7.5.  Cultures are pending. CT showed small subpleural segmental PE.  Agree with anticoagulation with Eliquis .  If she spikes fever again, I would give antibiotics.  Continue supportive care, will reach out to rounder to see if they plan to restart radiation tomorrow.  I think that she would likely need rehab after hospital discharge.  I will follow-up.  Onita Mattock MD 10/22/2023

## 2023-10-23 ENCOUNTER — Ambulatory Visit: Payer: Medicare (Managed Care)

## 2023-10-23 ENCOUNTER — Other Ambulatory Visit: Payer: Self-pay

## 2023-10-23 ENCOUNTER — Ambulatory Visit
Admission: RE | Admit: 2023-10-23 | Discharge: 2023-10-23 | Disposition: A | Discharge status: Home / Self Care | Payer: Medicare (Managed Care) | Source: Ambulatory Visit | Attending: Radiation Oncology

## 2023-10-23 DIAGNOSIS — G9341 Metabolic encephalopathy: Secondary | ICD-10-CM | POA: Diagnosis not present

## 2023-10-23 LAB — RAD ONC ARIA SESSION SUMMARY
Course Elapsed Days: 38
Plan Fractions Treated to Date: 27
Plan Prescribed Dose Per Fraction: 1.8 Gy
Plan Total Fractions Prescribed: 30
Plan Total Prescribed Dose: 54 Gy
Reference Point Dosage Given to Date: 48.6 Gy
Reference Point Session Dosage Given: 1.8 Gy
Session Number: 27

## 2023-10-23 LAB — CBC
HCT: 23.5 % — ABNORMAL LOW (ref 36.0–46.0)
Hemoglobin: 7.3 g/dL — ABNORMAL LOW (ref 12.0–15.0)
MCH: 30.9 pg (ref 26.0–34.0)
MCHC: 31.1 g/dL (ref 30.0–36.0)
MCV: 99.6 fL (ref 80.0–100.0)
Platelets: 106 K/uL — ABNORMAL LOW (ref 150–400)
RBC: 2.36 MIL/uL — ABNORMAL LOW (ref 3.87–5.11)
RDW: 16.1 % — ABNORMAL HIGH (ref 11.5–15.5)
WBC: 0.9 K/uL — CL (ref 4.0–10.5)
nRBC: 0 % (ref 0.0–0.2)

## 2023-10-23 LAB — BASIC METABOLIC PANEL WITH GFR
Anion gap: 8 (ref 5–15)
BUN: 9 mg/dL (ref 8–23)
CO2: 20 mmol/L — ABNORMAL LOW (ref 22–32)
Calcium: 7.6 mg/dL — ABNORMAL LOW (ref 8.9–10.3)
Chloride: 106 mmol/L (ref 98–111)
Creatinine, Ser: 1.1 mg/dL — ABNORMAL HIGH (ref 0.44–1.00)
GFR, Estimated: 55 mL/min — ABNORMAL LOW (ref >60)
Glucose, Bld: 116 mg/dL — ABNORMAL HIGH (ref 70–99)
Potassium: 3.1 mmol/L — ABNORMAL LOW (ref 3.5–5.1)
Sodium: 134 mmol/L — ABNORMAL LOW (ref 135–145)

## 2023-10-23 LAB — HIV ANTIBODY (ROUTINE TESTING W REFLEX): HIV Screen 4th Generation wRfx: NONREACTIVE

## 2023-10-23 LAB — MAGNESIUM: Magnesium: 2.2 mg/dL (ref 1.7–2.4)

## 2023-10-23 MED ORDER — VALACYCLOVIR HCL 500 MG PO TABS
500.0000 mg | ORAL_TABLET | Freq: Every day | ORAL | Status: DC
  Administered 2023-10-24 – 2023-11-03 (×12): 500 mg via ORAL
  Filled 2023-10-23 (×12): qty 1

## 2023-10-23 MED ORDER — POTASSIUM CHLORIDE CRYS ER 20 MEQ PO TBCR
30.0000 meq | EXTENDED_RELEASE_TABLET | ORAL | Status: AC
  Administered 2023-10-23 (×3): 30 meq via ORAL
  Filled 2023-10-23 (×3): qty 1

## 2023-10-23 MED ORDER — LAMOTRIGINE 100 MG PO TABS
200.0000 mg | ORAL_TABLET | Freq: Every day | ORAL | Status: DC
  Administered 2023-10-23 – 2023-11-02 (×11): 200 mg via ORAL
  Filled 2023-10-23 (×11): qty 2

## 2023-10-23 MED ORDER — GABAPENTIN 100 MG PO CAPS
300.0000 mg | ORAL_CAPSULE | Freq: Two times a day (BID) | ORAL | Status: DC
  Administered 2023-10-23 – 2023-11-03 (×23): 300 mg via ORAL
  Filled 2023-10-23 (×6): qty 1
  Filled 2023-10-23: qty 3
  Filled 2023-10-23: qty 1
  Filled 2023-10-23: qty 3
  Filled 2023-10-23: qty 1
  Filled 2023-10-23 (×2): qty 3
  Filled 2023-10-23 (×10): qty 1

## 2023-10-23 MED ORDER — DIAZEPAM 5 MG PO TABS
5.0000 mg | ORAL_TABLET | Freq: Two times a day (BID) | ORAL | Status: DC | PRN
  Administered 2023-10-30 – 2023-10-31 (×2): 5 mg via ORAL
  Filled 2023-10-23 (×2): qty 1

## 2023-10-23 MED ORDER — ATORVASTATIN CALCIUM 20 MG PO TABS
20.0000 mg | ORAL_TABLET | Freq: Every day | ORAL | Status: DC
  Administered 2023-10-24 – 2023-11-03 (×12): 20 mg via ORAL
  Filled 2023-10-23 (×11): qty 1

## 2023-10-23 MED ORDER — BACLOFEN 10 MG PO TABS: 10.0000 mg | ORAL_TABLET | Freq: Three times a day (TID) | ORAL | Status: DC | PRN

## 2023-10-23 MED ORDER — OXYCODONE HCL 5 MG PO TABS
5.0000 mg | ORAL_TABLET | ORAL | Status: DC | PRN
  Administered 2023-10-23: 10 mg via ORAL
  Administered 2023-10-23 (×2): 5 mg via ORAL
  Administered 2023-10-24 – 2023-10-30 (×23): 10 mg via ORAL
  Filled 2023-10-23 (×3): qty 2
  Filled 2023-10-23: qty 1
  Filled 2023-10-23 (×14): qty 2
  Filled 2023-10-23: qty 1
  Filled 2023-10-23 (×8): qty 2

## 2023-10-23 MED ORDER — BUPROPION HCL ER (XL) 150 MG PO TB24
450.0000 mg | ORAL_TABLET | Freq: Every morning | ORAL | Status: DC
  Administered 2023-10-24 – 2023-11-03 (×12): 450 mg via ORAL
  Filled 2023-10-23 (×11): qty 3

## 2023-10-23 NOTE — Plan of Care (Signed)

## 2023-10-23 NOTE — Care Management Obs Status (Signed)
 MEDICARE OBSERVATION STATUS NOTIFICATION   Patient Details  Name: LUJUANA KAPLER MRN: [id number] Date of Birth: 1956-01-14   Medicare Observation Status Notification Given:  Yes    Toy LITTIE Agar, RN 10/23/2023, 11:00 AM

## 2023-10-23 NOTE — Progress Notes (Signed)
 Critical Lab Level: 0.9 WBC   Nurse received call from Lab, Reporting pt's WBC count. MD notified.

## 2023-10-23 NOTE — Progress Notes (Addendum)
 PROGRESS NOTE    Misty [name]  FMW:[id number] DOB: [birth year] DOA: 10/22/2023 PCP: Dorcus [name], MD    Brief Narrative:   Misty [name] is a 68 y.o. 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  History obtained from patient as well as friend who is her HCPOA and at bedside at time of admission.  Overall patient having a rough time with her cancer treatments.  Over the last week has been having increasing rectal pain, stool incontinence.  Few days prior on 7/25, radiation oncology, Dr. Dewey increased her Dilaudid  from 2 mg to 4 mg every 4-6 hours as needed.  Additionally patient was seen urgently in the urology office due to urinary retention and had Foley catheter placed.  Her friend reports that since increasing the pain medication, patient has been a little more loopy that was intermittent with slow responses.  Patient's friend heard her in the bathroom making noise and went to check on her and was found to be sweaty, confused and incoherent.  Reports fever up to 101.0 at home.  Patient denies cough, no diarrhea, no chest pain, no shortness of breath.  In the ED, temperature 99.4 F, HR 99, RR 18, BP 107/73, SpO2 93% on room air.  WBC 1.9, hemoglobin 7.7, platelet count 139.  Sodium 131, potassium 3.3, chloride 102, CO2 22, glucose 113, BUN 21, creat 1.09.  AST 17, ALT 17, total bilirubin 0.7.  Lactic acid 0.8.  INR 1.1.  Urinalysis with trace leukocytes, negative nitrite, rare bacteria, 0-5 WBCs.  CT head without contrast with no acute intracranial abnormality, mild cerebral white matter changes consistent with chronic small vessel disease.  CT angiogram  chest with findings of subtle small subsegmental embolus left lingula pulmonary artery and anterior branch of right middle lobe pulmonary artery, no evidence of right heart strain, mild cardiomegaly, mild central lobar emphysematous disease, aortic atherosclerosis.  Urine culture and blood cultures x 2 obtained.  Patient received IV Dilaudid  in the ED and following, patient noted to become hypoxic and placed on 4 L nasal cannula.  TRH consulted for admission for further evaluation management of acute metabolic encephalopathy, acute pulmonary embolism, weakness, debility, uncontrolled pain of malignancy.  Assessment & Plan:   Acute metabolic encephalopathy: Resolved Patient presents with confusion, currently undergoing chemoradiation with recent increase of her pain regimen of Dilaudid  from 2 mg to 4 mg every 4-6 hours.  Friend noted that confusion started thereafter.  Patient also with noted fever, also positive for pulmonary embolism on CT imaging.  CT head without contrast with no acute intracranial abnormality.  Urinalysis unrevealing, no focal consolidation noted on CT chest imaging suggestive of a pneumonia.  Dilaudid  has been discontinued and patient's mental status appears back to baseline. -- Supportive care -- Treatment as below  Acute pulmonary embolism Patient presenting with fever.  No infectious etiology elucidated.  CT chest angiogram with findings of subtle small subsegmental embolus left lingula pulmonary artery and anterior branch of right middle lobe pulmonary artery, no evidence of right heart strain -- Started on Eliquis   Hypokalemia Potassium 3.1, will replete. -- Repeat electrolytes in the a.m.  Anemia of chronic medical disease -- Hgb 7.7>7.3 -- Transfuse for hemoglobin less than 7.0 -- CBC daily  Urinary retention Foley catheter placed by urology 2 days prior to admission. -- Continue Foley catheter -- Outpatient follow-up with urology  Squamous cell carcinoma  anus -- Medical oncology following, appreciate assistance -- Currently on chemoradiation  Pain of malignancy Patient recently had her home regimen of Dilaudid  increased from 2 mg to 4 mg p.o. 4-6 hours as needed.  Family has noted that after this increase, her confusion worsened. -- Discontinue Dilaudid  -- Start oxycodone  5-10 mg p.o. every 4 hours as needed moderate/severe pain -- Gabapentin  reduced to 300 mg p.o. twice daily -- Continue home baclofen  10 mg p.o. 3 times daily as needed muscle spasms  Hypothyroidism -- Levothyroxine  25 mcg p.o. daily  Hyperlipidemia -- Atorvastatin  20 mg p.o. daily  Anxiety/depression -- Bupropion  3 mL p.o. every morning -- Valium  5 mg p.o. twice daily as needed anxiety  COPD -- Breztri  2 puffs twice daily -- Singulair  10 mg p.o. nightly -- Albuterol  neb every 2 hours.  Wheezing/shortness of breath  Mucositis -- Magic mouthwash  Insomnia -- Trazodone  25 mg p.o. nightly as needed sleep  Weakness/ability/deconditioning/gait disturbance: -- PT/OT recommend SNF placement -- TOC consulted for placement   DVT prophylaxis:  apixaban  (ELIQUIS ) tablet 10 mg  apixaban  (ELIQUIS ) tablet 5 mg    Code Status: Full Code Family Communication: No family present at bedside this morning  Disposition Plan:  Level of care: Med-Surg Status is: Observation The patient remains OBS appropriate and will d/c before 2 midnights.    Consultants:  Medical oncology, Dr. Lanny  Procedures:  none  Antimicrobials:  None   Subjective: Patient seen examined bedside, lying in bed.  Confusion resolved.  Requesting restart of some of her medications.  Discussed with patient regarding her pain medications, will discontinue Dilaudid  given potential effect causing confusion and will start oxycodone .  Also reducing gabapentin  to 300 mg p.o. twice daily.  No infectious etiology found such far causing fevers, could be contributed to pulmonary embolism.  Holding  antibiotics at this time.  Awaiting urine/blood culture results.  Currently afebrile.  No other specific questions, concerns or complaints at this time.  Seen by PT and OT with recommendation of SNF placement, TOC consulted.  Denies headache, no visual changes, no chest pain, no palpitations, no shortness of breath, no abdominal pain, no current fever, no chills/night sweats, no nausea/vomiting/diarrhea, no focal weakness, no fatigue, no paresthesias.  No acute events overnight per nursing staff.  Objective: Vitals:   10/23/23 0158 10/23/23 0643 10/23/23 0743 10/23/23 0951  BP: (!) 101/38 (!) 117/51  105/79  Pulse: 84 92  (!) 104  Resp: 14 16  18   Temp: 99.4 F (37.4 C) 98.8 F (37.1 C)  99.8 F (37.7 C)  TempSrc: Oral Oral  Oral  SpO2: 97% 99% 99% 95%  Weight:      Height:        Intake/Output Summary (Last 24 hours) at 10/23/2023 1256 Last data filed at 10/23/2023 1015 Gross per 24 hour  Intake --  Output 1970 ml  Net -1970 ml   Filed Weights   10/22/23 1629  Weight: 72 kg    Examination:  Physical Exam: GEN: NAD, alert and oriented x 3, elderly appearance HEENT: NCAT, PERRL, EOMI, sclera clear, MMM PULM: CTAB w/o wheezes/crackles, normal respiratory effort, on room air CV: RRR w/o M/G/R GI: abd soft, NTND, + BS MSK: no peripheral edema, moves all extremities independently NEURO: No focal neurological deficit PSYCH: normal mood/affect Integumentary: No concerning rashes/lesions/wounds none exposed skin surfaces  Data Reviewed: I have personally reviewed following labs and imaging studies  CBC: Recent Labs  Lab 10/20/23 0941 10/22/23 0405 10/23/23 0636  WBC 4.1 1.9* 0.9*  NEUTROABS 2.7 1.3*  --   HGB 8.8* 7.7* 7.3*  HCT 26.9* 24.1* 23.5*  MCV 96.1 98.4 99.6  PLT 172 139* 106*   Basic Metabolic Panel: Recent Labs  Lab 10/20/23 0941 10/22/23 0405 10/23/23 0636  NA 138 131* 134*  K 3.6 3.3* 3.1*  CL 103 102 106  CO2 29 22 20*  GLUCOSE 101* 113* 116*   BUN 26* 21 9  CREATININE 1.24* 1.09* 1.10*  CALCIUM  9.1 8.5* 7.6*  MG  --   --  2.2   GFR: Estimated Creatinine Clearance: 47.2 mL/min (A) (by C-G formula based on SCr of 1.1 mg/dL (H)). Liver Function Tests: Recent Labs  Lab 10/20/23 0941 10/22/23 0405  AST 11* 17  ALT 15 17  ALKPHOS 70 61  BILITOT 0.4 0.7  PROT 6.5 5.8*  ALBUMIN 3.6 3.0*   No results for input(s): LIPASE, AMYLASE in the last 168 hours. No results for input(s): AMMONIA in the last 168 hours. Coagulation Profile: Recent Labs  Lab 10/22/23 0405  INR 1.1   Cardiac Enzymes: No results for input(s): CKTOTAL, CKMB, CKMBINDEX, TROPONINI in the last 168 hours. BNP (last 3 results) No results for input(s): PROBNP in the last 8760 hours. HbA1C: No results for input(s): HGBA1C in the last 72 hours. CBG: No results for input(s): GLUCAP in the last 168 hours. Lipid Profile: No results for input(s): CHOL, HDL, LDLCALC, TRIG, CHOLHDL, LDLDIRECT in the last 72 hours. Thyroid Function Tests: No results for input(s): TSH, T4TOTAL, FREET4, T3FREE, THYROIDAB in the last 72 hours. Anemia Panel: No results for input(s): VITAMINB12, FOLATE, FERRITIN, TIBC, IRON, RETICCTPCT in the last 72 hours. Sepsis Labs: Recent Labs  Lab 10/22/23 0416  LATICACIDVEN 0.8    Recent Results (from the past 240 hours)  Blood Culture (routine x 2)     Status: None (Preliminary result)   Collection Time: 10/22/23  4:05 AM   Specimen: BLOOD  Result Value Ref Range Status   Specimen Description   Final    BLOOD RIGHT ANTECUBITAL Performed at [facility], [address], [place], [place] [address]    Special Requests   Final    BOTTLES DRAWN AEROBIC AND ANAEROBIC Blood Culture results may not be optimal due to an inadequate volume of blood received in culture bottles Performed at [facility], [address] [place], [place] [address]     Culture   Final    NO GROWTH 1 DAY Performed at [facility], [address], [place], [place] [address]    Report Status PENDING  Incomplete  Blood Culture (routine x 2)     Status: None (Preliminary result)   Collection Time: 10/22/23  5:25 AM   Specimen: BLOOD LEFT WRIST  Result Value Ref Range Status   Specimen Description   Final    BLOOD LEFT WRIST Performed at [facility], [address] [place], [place] [address]    Special Requests   Final    BOTTLES DRAWN AEROBIC AND ANAEROBIC Blood Culture results may not be optimal due to an inadequate volume of blood received in culture bottles Performed at [facility], [address] [place], [place] [address]    Culture   Final    NO GROWTH 1 DAY Performed at [facility], [address], [place], [place] [address]    Report Status PENDING  Incomplete         Radiology Studies: CT Angio Chest Pulmonary Embolism (PE) W or WO Contrast Addendum Date: 10/22/2023 ADDENDUM REPORT: 10/22/2023 10:42 ADDENDUM: Critical Value/emergent results were called by telephone at the time of interpretation on 10/22/2023 at 10:32 a.m. to provider [facility] , who verbally acknowledged these results. Electronically Signed   By: Toribio Agreste M.D.   On: 10/22/2023 10:42   Result Date: 10/22/2023 CLINICAL DATA:  History of squamous cell anal carcinoma undergoing chemotherapy. Patient with confusion and possible fever. Suspect pulmonary embolism. EXAM: CT ANGIOGRAPHY CHEST WITH CONTRAST TECHNIQUE: Multidetector CT imaging of the chest was performed using the standard protocol during bolus administration of intravenous contrast. Multiplanar CT image reconstructions and MIPs were obtained to evaluate the vascular anatomy. RADIATION DOSE REDUCTION: This exam was performed according to the departmental dose-optimization program which includes automated exposure control, adjustment of the mA and/or kV according to patient  size and/or use of iterative reconstruction technique. CONTRAST:  75mL OMNIPAQUE  IOHEXOL  350 MG/ML SOLN COMPARISON:  PET-CT 10/22/2023 FINDINGS: Cardiovascular: Mild cardiomegaly. Thoracic aorta is normal in caliber. Minimal calcified plaque over the descending thoracic aorta. Pulmonary arterial system is adequately opacified. Findings suggesting subtle small subsegmental embolus over the left lingular pulmonary artery as well as anterior branch right middle lobe pulmonary artery. Remaining vascular structures are unremarkable. Mediastinum/Nodes: No evidence of mediastinal or hilar adenopathy. Remaining mediastinal structures are unremarkable. Lungs/Pleura: Lungs are adequately inflated. Minimal posterior bibasilar dependent atelectasis. No significant effusion. Suggestion of mild centrilobular emphysematous disease. Airways are normal. Upper Abdomen: No acute findings. Musculoskeletal: No acute or concerning findings. Review of the MIP images confirms the above findings. IMPRESSION: 1. Findings suggesting subtle small subsegmental embolus over the left lingular pulmonary artery as well as anterior branch right middle lobe pulmonary artery. No evidence of right heart strain. 2. Mild cardiomegaly. 3. Suggestion of mild centrilobular emphysematous disease. 4. Aortic atherosclerosis. Aortic Atherosclerosis (ICD10-I70.0) and Emphysema (ICD10-J43.9). Ordering physician has been paged. Electronically Signed: By: Toribio Agreste M.D. On: 10/22/2023 10:24   CT Head Wo Contrast Result Date: 10/22/2023 CLINICAL DATA:  68 year old female with altered mental status. Stage IV anal rectal cancer. EXAM: CT HEAD WITHOUT CONTRAST TECHNIQUE: Contiguous axial images were obtained from the base of the skull through the vertex without intravenous contrast. RADIATION DOSE REDUCTION: This exam was performed according to the departmental dose-optimization program which includes automated exposure control, adjustment of the mA and/or kV  according to patient size and/or use of iterative reconstruction technique. COMPARISON:  Head CT 11/27/2014. FINDINGS: Brain: No midline shift, ventriculomegaly, mass effect, evidence of mass lesion, intracranial hemorrhage or evidence of cortically based acute infarction. Patchy mild for age bilateral cerebral white matter hypodensity. Otherwise maintained gray-white differentiation. No cortical encephalomalacia identified. Vascular: No suspicious intracranial vascular hyperdensity. Calcified atherosclerosis at the skull base. Skull: Intact.  No acute or suspicious osseous lesion identified. Sinuses/Orbits: Visualized paranasal sinuses and mastoids are stable and well aerated. Other: Visualized orbits and scalp soft tissues are within normal limits. IMPRESSION: No acute intracranial abnormality by noncontrast CT. Mild for age cerebral white matter changes, most commonly due to chronic small vessel disease. Electronically Signed   By: VEAR Hurst M.D.   On: 10/22/2023 07:19   DG Chest 2 View Result Date: 10/22/2023 EXAM: 2 VIEW(S) XRAY OF THE CHEST 10/22/2023 04:41:00 AM COMPARISON: 10/08/2019 CLINICAL HISTORY: Fever. Chemo, fever. FINDINGS: LUNGS AND PLEURA:  No focal pulmonary opacity. No pulmonary edema. No pleural effusion. No pneumothorax. HEART AND MEDIASTINUM: No acute abnormality of the cardiac and mediastinal silhouettes. Aortic atherosclerotic calcification. BONES AND SOFT TISSUES: Remote fracture deformity involving the midshaft of right clavicle. No acute osseous abnormality. IMPRESSION: 1. No acute cardiopulmonary pathology. Electronically signed by: Taylor Stroud MD 10/22/2023 05:46 AM EDT RP Workstation: [id number]        Scheduled Meds:  apixaban   10 mg Oral BID   Followed by   NOREEN ON 10/29/2023] apixaban   5 mg Oral BID   atorvastatin   20 mg Oral Daily   budesonide -glycopyrrolate -formoterol   2 puff Inhalation BID   buPROPion   300 mg Oral q morning   Chlorhexidine  Gluconate Cloth  6 each  Topical Daily   gabapentin   300 mg Oral BID   lamoTRIgine   200 mg Oral QHS   levothyroxine   75 mcg Oral Q0600   liver oil-zinc  oxide   Topical BID   loperamide   2 mg Oral Once   loratadine   10 mg Oral Daily   montelukast   10 mg Oral QHS   potassium chloride   30 mEq Oral Q3H   valACYclovir   500 mg Oral Daily   Continuous Infusions:   LOS: 0 days    Time spent: 52 minutes spent on 10/23/2023 caring for this patient face-to-face including chart review, ordering labs/tests, documenting, discussion with nursing staff, consultants, updating family and interview/physical exam    Camellia PARAS [place], DO [facility] Available via Epic secure chat 7am-7pm After these hours, please refer to coverage provider listed on [email or web address] 10/23/2023, 12:56 PM

## 2023-10-23 NOTE — Progress Notes (Signed)
   10/23/23 1100  TOC Brief Assessment  Insurance and Status Reviewed  Patient has primary care physician Yes (Beam, [name], MD)  Home environment has been reviewed Home alone  Prior level of function: Independent  Prior/Current Home Services No current home services  Social Drivers of Health Review SDOH reviewed no interventions necessary  Readmission risk has been reviewed Yes  Transition of care needs no transition of care needs at this time

## 2023-10-23 NOTE — Evaluation (Signed)
 Occupational Therapy Evaluation Patient Details Name: Misty [name] MRN: [id number] DOB: [birth year] Today's Date: 10/23/2023   History of Present Illness   Misty [name] is a 68 yr old female presents with worsening of anal pain, fecal incontinence, confusion and development of fever. PMH: COPD, bipolar disorder, hypothyroidism, squamous cell carcinoma of the anus undergoing concurrent chemoradiation     Clinical Impressions The pt is currently presenting below her baseline level of functioning for self-care management, as she is limited by the below listed deficits (see OT problem list). During the session, she required CGA to min assist for tasks, including sit to stand, lower body dressing, and toileting at bathroom level. She was limited by significant 8/10 rectal pain, with associated guarding of movements and difficulty achieving comfortable positioning, especially while sitting. She was also noted to be with shakiness in standing, and she reported feelings of generalized weakness and fatigue. She lives alone and does not have consistent and daily assistance available to her. OT anticipates the pt would have increased difficulty managing all of her daily activities in the home. As such, short-term SNF rehab is recommended, to maximize her functional independence and to facilitate her safe return home alone.      If plan is discharge home, recommend the following:   Assistance with cooking/housework;Assist for transportation;A little help with bathing/dressing/bathroom     Functional Status Assessment   Patient has had a recent decline in their functional status and demonstrates the ability to make significant improvements in function in a reasonable and predictable amount of time.     Equipment Recommendations   None recommended by OT     Recommendations for Other Services         Precautions/Restrictions   Precautions Precautions:  Fall Restrictions Weight Bearing Restrictions Per Provider Order: No     Mobility Bed Mobility Overal bed mobility: Needs Assistance Bed Mobility: Sit to Supine, Supine to Sit     Supine to sit: Contact guard Sit to supine: Supervision        Transfers Overall transfer level: Needs assistance Equipment used: Rolling walker (2 wheels) Transfers: Sit to/from Stand Sit to Stand: Min assist                  Balance     Sitting balance-Leahy Scale: Good         Standing balance comment: CGA to min assist with RW           ADL either performed or assessed with clinical judgement   ADL Overall ADL's : Needs assistance/impaired Eating/Feeding: Independent;Sitting   Grooming: Contact guard assist;Standing Grooming Details (indicate cue type and reason): She performed hand washing in standing at sink level.         Upper Body Dressing : Set up;Sitting   Lower Body Dressing: Minimal assistance;Sit to/from stand   Toilet Transfer: Contact guard assist;Rolling walker (2 wheels);Ambulation;Grab bars   Toileting- Clothing Manipulation and Hygiene: Contact guard assist;Sit to/from stand Toileting - Clothing Manipulation Details (indicate cue type and reason): She performed toileting at bathroom level, requiring intermittent steadying assist in standing and cues for walker placement.             Vision   Additional Comments: She correctly read the time depicted on the wall clock.            Pertinent Vitals/Pain Pain Assessment Pain Assessment: 0-10 Pain Score: 8  Pain Location: bottom Pain Descriptors / Indicators: Discomfort, Grimacing, Guarding Pain Intervention(s): Limited  activity within patient's tolerance, Monitored during session, Patient requesting pain meds-RN notified, RN gave pain meds during session     Extremity/Trunk Assessment Upper Extremity Assessment Upper Extremity Assessment: Right hand dominant;RUE deficits/detail;LUE  deficits/detail RUE Deficits / Details: AROM WFL. Functional grip strength LUE Deficits / Details: AROM WFL. Functional grip strength   Lower Extremity Assessment Lower Extremity Assessment: RLE deficits/detail;LLE deficits/detail;Generalized weakness RLE Deficits / Details: AROM WFL LLE Deficits / Details: AROM WFL   Cervical / Trunk Assessment Cervical / Trunk Assessment: Normal   Communication Communication Communication: No apparent difficulties   Cognition Arousal: Alert Behavior During Therapy: WFL for tasks assessed/performed        OT - Cognition Comments: Oriented x4                 Following commands: Intact                  Home Living Family/patient expects to be discharged to:: Private residence Living Arrangements: Alone Available Help at Discharge: Family;Friend(s);Available PRN/intermittently Type of Home: Apartment Home Access: Elevator     Home Layout: One level     Bathroom Shower/Tub: Producer, television/film/video: Standard     Home Equipment: Shower seat - built in;Rollator (4 wheels)          Prior Functioning/Environment Prior Level of Function : Independent/Modified Independent             Mobility Comments:  (She was independent with ambulation, however has been limited recently, due to weakness from starting cancer treatment.) ADLs Comments:  (She was independent with ADLs, cooking, and cleaning.)    OT Problem List: Decreased strength;Decreased activity tolerance;Impaired balance (sitting and/or standing);Decreased knowledge of use of DME or AE;Pain   OT Treatment/Interventions: Self-care/ADL training;Therapeutic exercise;Therapeutic activities;Energy conservation;DME and/or AE instruction;Patient/family education;Balance training      OT Goals(Current goals can be found in the care plan section)   Acute Rehab OT Goals Patient Stated Goal: to get better OT Goal Formulation: With patient Time For Goal  Achievement: 11/06/23 Potential to Achieve Goals: Good ADL Goals Pt Will Perform Grooming: with modified independence;standing Pt Will Perform Lower Body Dressing: with modified independence;sit to/from stand;sitting/lateral leans Pt Will Transfer to Toilet: with modified independence;ambulating Pt Will Perform Toileting - Clothing Manipulation and hygiene: with modified independence;sit to/from stand   OT Frequency:  Min 2X/week       AM-PAC OT 6 Clicks Daily Activity     Outcome Measure Help from another person eating meals?: None Help from another person taking care of personal grooming?: A Little Help from another person toileting, which includes using toliet, bedpan, or urinal?: A Little Help from another person bathing (including washing, rinsing, drying)?: A Little Help from another person to put on and taking off regular upper body clothing?: A Little Help from another person to put on and taking off regular lower body clothing?: A Little 6 Click Score: 19   End of Session Equipment Utilized During Treatment: Rolling walker (2 wheels) Nurse Communication: Patient requests pain meds  Activity Tolerance: Patient limited by pain Patient left: in bed;with call bell/phone within reach;with bed alarm set;with family/visitor present  OT Visit Diagnosis: Unsteadiness on feet (R26.81);Pain;Muscle weakness (generalized) (M62.81) Pain - part of body:  (rectum)                Time: 8984-8957 OT Time Calculation (min): 27 min Charges:  OT General Charges $OT Visit: 1 Visit OT Evaluation $OT Eval Moderate Complexity:  1 Mod OT Treatments $Self Care/Home Management : 8-22 mins   Delanna LITTIE Molt, OTR/L 10/23/2023, 12:17 PM

## 2023-10-23 NOTE — Evaluation (Signed)
 Physical Therapy Evaluation Patient Details Name: Misty [name] MRN: [id number] DOB: 08-11-1955 Today's Date: 10/23/2023  History of Present Illness  Misty [name] is a 68 y.o. female presents with worsening of anal pain, fecal incontinence, confusion and development of fever. 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 Pt from home alone, has family/friend support PRN, at baseline was ind with self care, community amb and working out at gym 3x/week but since starting chemo/radiation her time has been consumed with that and she has become progressively weaker. Pt reports living in apartment highrise, has elevator to 3rd floor single level apartment. On eval, pt limited by BLE weakness, increased shakiness with movement, pain at bottom and foley cath site, decreased endurance. Pt tolerates STS reps from EOB and limited steps from bed to sink to wash hands then to recliner before needing seated rest break, min A for steadying and RW management. Patient will benefit from continued inpatient follow up therapy, <3 hours/day. Pt currently with functional limitations due to the deficits listed below (see PT Problem List). Pt will benefit from acute skilled PT to increase their independence and safety with mobility to allow discharge.           If plan is discharge home, recommend the following: A little help with walking and/or transfers;A little help with bathing/dressing/bathroom;Assistance with cooking/housework;Assist for transportation   Can travel by private vehicle   Yes    Equipment Recommendations Other (comment) (RW vs rollator)  Recommendations for Other Services       Functional Status Assessment Patient has had a recent decline in their functional status and demonstrates the ability to make significant improvements in function in a reasonable  and predictable amount of time.     Precautions / Restrictions Precautions Precautions: Fall Precaution/Restrictions Comments: concurrent chemoradiation Restrictions Weight Bearing Restrictions Per Provider Order: No      Mobility  Bed Mobility Overal bed mobility: Needs Assistance Bed Mobility: Supine to Sit     Supine to sit: Min assist     General bed mobility comments: min A to mobilize BLE and upright trunk into sitting, cues for hand placement on bedrails to self assist    Transfers Overall transfer level: Needs assistance Equipment used: Rolling walker (2 wheels) Transfers: Sit to/from Stand, Bed to chair/wheelchair/BSC Sit to Stand: Min assist   Step pivot transfers: Min assist       General transfer comment: min A to power to stand and engage in marching in place, increased shakiness with time on feet, 2nd STS rep able to step pivot to recliner wtih min A to steady    Ambulation/Gait Ambulation/Gait assistance: Min assist Gait Distance (Feet): 6 Feet Assistive device: Rolling walker (2 wheels) Gait Pattern/deviations: Decreased step length - right, Decreased step length - left, Decreased stride length Gait velocity: decreased     General Gait Details: limited by fecal incontinence, slow, short steps in room from bed to sink to wash hands then to recliner, pt fatigues easily, on 1L with Spo2 92-95%  Stairs            Wheelchair Mobility     Tilt Bed    Modified Rankin (Stroke Patients Only)       Balance Overall balance assessment: Needs assistance Sitting-balance support: Feet supported, Bilateral upper extremity supported Sitting balance-Leahy Scale: Fair     Standing balance support: Reliant on assistive device for balance,  During functional activity, Bilateral upper extremity supported Standing balance-Leahy Scale: Poor                               Pertinent Vitals/Pain Pain Assessment Pain Assessment: Faces Faces  Pain Scale: Hurts whole lot Pain Location: bottom Pain Descriptors / Indicators: Discomfort, Grimacing, Guarding Pain Intervention(s): Limited activity within patient's tolerance, Monitored during session, Repositioned    Home Living Family/patient expects to be discharged to:: Private residence Living Arrangements: Alone Available Help at Discharge: Family;Friend(s);Neighbor;Available PRN/intermittently Type of Home: Apartment Home Access: Elevator (3rd floor)       Home Layout: One level Home Equipment: Shower seat;Grab bars - tub/shower      Prior Function Prior Level of Function : Independent/Modified Independent             Mobility Comments: pt reports was ind with community amb, going to gym 3x/week but since starting chemo/radiation mostly homebound, denies falls in last 6 months ADLs Comments: pt reports ind with self care, household chores     Extremity/Trunk Assessment   Upper Extremity Assessment Upper Extremity Assessment: Defer to OT evaluation    Lower Extremity Assessment Lower Extremity Assessment: Generalized weakness    Cervical / Trunk Assessment Cervical / Trunk Assessment: Normal  Communication   Communication Communication: No apparent difficulties    Cognition Arousal: Alert Behavior During Therapy: WFL for tasks assessed/performed   PT - Cognitive impairments: No apparent impairments                       PT - Cognition Comments: pt a&o, reports feeling more mentally clear today, reports one day off on current date Following commands: Intact       Cueing       General Comments General comments (skin integrity, edema, etc.): pt on 1L O2 with Spo2 92-95% during session    Exercises General Exercises - Lower Extremity Long Arc Quad: AROM, Both, 5 reps, Seated Hip Flexion/Marching: AROM, Both, 5 reps, Seated Toe Raises: AROM, Both, 5 reps, Seated Heel Raises: AROM, Both, 5 reps, Seated   Assessment/Plan    PT  Assessment Patient needs continued PT services  PT Problem List Decreased strength;Decreased activity tolerance;Decreased knowledge of use of DME;Decreased balance;Decreased mobility;Decreased safety awareness;Decreased knowledge of precautions;Pain       PT Treatment Interventions DME instruction;Gait training;Functional mobility training;Therapeutic activities;Therapeutic exercise;Balance training;Patient/family education;Manual techniques    PT Goals (Current goals can be found in the Care Plan section)  Acute Rehab PT Goals Patient Stated Goal: control bowel movements PT Goal Formulation: With patient Time For Goal Achievement: 11/06/23 Potential to Achieve Goals: Good    Frequency Min 2X/week     Co-evaluation               AM-PAC PT 6 Clicks Mobility  Outcome Measure Help needed turning from your back to your side while in a flat bed without using bedrails?: A Little Help needed moving from lying on your back to sitting on the side of a flat bed without using bedrails?: A Little Help needed moving to and from a bed to a chair (including a wheelchair)?: A Little Help needed standing up from a chair using your arms (e.g., wheelchair or bedside chair)?: A Little Help needed to walk in hospital room?: A Lot Help needed climbing 3-5 steps with a railing? : Total 6 Click Score: 15    End of Session Equipment  Utilized During Treatment: Gait belt Activity Tolerance: Patient tolerated treatment well Patient left: in chair;with call bell/phone within reach;with chair alarm set;with family/visitor present (pharmacy in room) Nurse Communication: Mobility status;Other (comment) (bed needs linen change) PT Visit Diagnosis: Unsteadiness on feet (R26.81);Muscle weakness (generalized) (M62.81);Pain Pain - part of body:  (bottom)    Time: 9075-8998 PT Time Calculation (min) (ACUTE ONLY): 37 min   Charges:   PT Evaluation $PT Eval Moderate Complexity: 1 Mod PT  Treatments $Therapeutic Activity: 8-22 mins PT General Charges $$ ACUTE PT VISIT: 1 Visit         Tori Jamarea Selner PT, DPT 10/23/23, 11:26 AM

## 2023-10-23 NOTE — Care Management Obs Status (Signed)
 MEDICARE OBSERVATION STATUS NOTIFICATION   Patient Details  Name: Misty [name] MRN: [id number] Date of Birth: [birth year]   Medicare Observation Status Notification Given:  Yes    Toy LITTIE Agar, RN 10/23/2023, 10:53 AM

## 2023-10-24 ENCOUNTER — Ambulatory Visit
Admission: RE | Admit: 2023-10-24 | Discharge: 2023-10-24 | Disposition: A | Discharge status: Home / Self Care | Payer: Medicare (Managed Care) | Source: Ambulatory Visit | Attending: Radiation Oncology | Admitting: Radiation Oncology

## 2023-10-24 ENCOUNTER — Other Ambulatory Visit: Payer: Self-pay

## 2023-10-24 DIAGNOSIS — D709 Neutropenia, unspecified: Secondary | ICD-10-CM | POA: Diagnosis present

## 2023-10-24 DIAGNOSIS — D6959 Other secondary thrombocytopenia: Secondary | ICD-10-CM | POA: Diagnosis present

## 2023-10-24 DIAGNOSIS — F419 Anxiety disorder, unspecified: Secondary | ICD-10-CM | POA: Diagnosis present

## 2023-10-24 DIAGNOSIS — I700 Atherosclerosis of aorta: Secondary | ICD-10-CM | POA: Diagnosis present

## 2023-10-24 DIAGNOSIS — Z510 Encounter for antineoplastic radiation therapy: Secondary | ICD-10-CM | POA: Insufficient documentation

## 2023-10-24 DIAGNOSIS — J432 Centrilobular emphysema: Secondary | ICD-10-CM | POA: Diagnosis present

## 2023-10-24 DIAGNOSIS — E876 Hypokalemia: Secondary | ICD-10-CM | POA: Diagnosis present

## 2023-10-24 DIAGNOSIS — G9341 Metabolic encephalopathy: Secondary | ICD-10-CM | POA: Diagnosis present

## 2023-10-24 DIAGNOSIS — C210 Malignant neoplasm of anus, unspecified: Secondary | ICD-10-CM | POA: Insufficient documentation

## 2023-10-24 DIAGNOSIS — F32A Depression, unspecified: Secondary | ICD-10-CM | POA: Diagnosis present

## 2023-10-24 DIAGNOSIS — G893 Neoplasm related pain (acute) (chronic): Secondary | ICD-10-CM | POA: Diagnosis present

## 2023-10-24 DIAGNOSIS — E785 Hyperlipidemia, unspecified: Secondary | ICD-10-CM | POA: Diagnosis present

## 2023-10-24 DIAGNOSIS — Z7901 Long term (current) use of anticoagulants: Secondary | ICD-10-CM | POA: Diagnosis not present

## 2023-10-24 DIAGNOSIS — A419 Sepsis, unspecified organism: Secondary | ICD-10-CM | POA: Diagnosis not present

## 2023-10-24 DIAGNOSIS — I2699 Other pulmonary embolism without acute cor pulmonale: Secondary | ICD-10-CM | POA: Diagnosis present

## 2023-10-24 DIAGNOSIS — R6521 Severe sepsis with septic shock: Secondary | ICD-10-CM | POA: Diagnosis not present

## 2023-10-24 DIAGNOSIS — J189 Pneumonia, unspecified organism: Secondary | ICD-10-CM | POA: Diagnosis present

## 2023-10-24 DIAGNOSIS — J440 Chronic obstructive pulmonary disease with acute lower respiratory infection: Secondary | ICD-10-CM | POA: Diagnosis present

## 2023-10-24 DIAGNOSIS — G4700 Insomnia, unspecified: Secondary | ICD-10-CM | POA: Diagnosis present

## 2023-10-24 DIAGNOSIS — R5081 Fever presenting with conditions classified elsewhere: Secondary | ICD-10-CM | POA: Diagnosis present

## 2023-10-24 DIAGNOSIS — D61818 Other pancytopenia: Secondary | ICD-10-CM | POA: Diagnosis present

## 2023-10-24 DIAGNOSIS — Z66 Do not resuscitate: Secondary | ICD-10-CM | POA: Diagnosis not present

## 2023-10-24 DIAGNOSIS — D701 Agranulocytosis secondary to cancer chemotherapy: Secondary | ICD-10-CM | POA: Diagnosis present

## 2023-10-24 DIAGNOSIS — E039 Hypothyroidism, unspecified: Secondary | ICD-10-CM | POA: Diagnosis present

## 2023-10-24 LAB — IRON AND TIBC
Iron: 19 ug/dL — ABNORMAL LOW (ref 28–170)
Saturation Ratios: 11 % (ref 10.4–31.8)
TIBC: 168 ug/dL — ABNORMAL LOW (ref 250–450)
UIBC: 149 ug/dL

## 2023-10-24 LAB — CBC
HCT: 21.9 % — ABNORMAL LOW (ref 36.0–46.0)
Hemoglobin: 6.7 g/dL — CL (ref 12.0–15.0)
MCH: 30.6 pg (ref 26.0–34.0)
MCHC: 30.6 g/dL (ref 30.0–36.0)
MCV: 100 fL (ref 80.0–100.0)
Platelets: 100 K/uL — ABNORMAL LOW (ref 150–400)
RBC: 2.19 MIL/uL — ABNORMAL LOW (ref 3.87–5.11)
RDW: 16.4 % — ABNORMAL HIGH (ref 11.5–15.5)
WBC: 0.5 K/uL — CL (ref 4.0–10.5)
nRBC: 0 % (ref 0.0–0.2)

## 2023-10-24 LAB — BASIC METABOLIC PANEL WITH GFR
Anion gap: 7 (ref 5–15)
BUN: 10 mg/dL (ref 8–23)
CO2: 19 mmol/L — ABNORMAL LOW (ref 22–32)
Calcium: 7.8 mg/dL — ABNORMAL LOW (ref 8.9–10.3)
Chloride: 105 mmol/L (ref 98–111)
Creatinine, Ser: 0.98 mg/dL (ref 0.44–1.00)
GFR, Estimated: >60 mL/min (ref >60)
Glucose, Bld: 135 mg/dL — ABNORMAL HIGH (ref 70–99)
Potassium: 3.5 mmol/L (ref 3.5–5.1)
Sodium: 131 mmol/L — ABNORMAL LOW (ref 135–145)

## 2023-10-24 LAB — RAD ONC ARIA SESSION SUMMARY
Course Elapsed Days: 39
Plan Fractions Treated to Date: 28
Plan Prescribed Dose Per Fraction: 1.8 Gy
Plan Total Fractions Prescribed: 30
Plan Total Prescribed Dose: 54 Gy
Reference Point Dosage Given to Date: 50.4 Gy
Reference Point Session Dosage Given: 1.8 Gy
Session Number: 28

## 2023-10-24 LAB — PROTIME-INR
INR: 1.9 — ABNORMAL HIGH (ref 0.8–1.2)
Prothrombin Time: 22.7 s — ABNORMAL HIGH (ref 11.4–15.2)

## 2023-10-24 LAB — VITAMIN B12: Vitamin B-12: 718 pg/mL (ref 180–914)

## 2023-10-24 LAB — RETICULOCYTES
Immature Retic Fract: 14.2 % (ref 2.3–15.9)
RBC.: 2.16 MIL/uL — ABNORMAL LOW (ref 3.87–5.11)
Retic Count, Absolute: 30.9 K/uL (ref 19.0–186.0)
Retic Ct Pct: 1.4 % (ref 0.4–3.1)

## 2023-10-24 LAB — FERRITIN: Ferritin: 387 ng/mL — ABNORMAL HIGH (ref 11–307)

## 2023-10-24 LAB — URINE CULTURE: Culture: NO GROWTH

## 2023-10-24 LAB — FOLATE: Folate: 37.8 ng/mL (ref >5.9)

## 2023-10-24 LAB — MAGNESIUM: Magnesium: 2 mg/dL (ref 1.7–2.4)

## 2023-10-24 MED ORDER — POTASSIUM CHLORIDE CRYS ER 20 MEQ PO TBCR
40.0000 meq | EXTENDED_RELEASE_TABLET | Freq: Once | ORAL | Status: AC
  Administered 2023-10-24: 40 meq via ORAL
  Filled 2023-10-24: qty 2

## 2023-10-24 MED ORDER — PRENATAL MULTIVITAMIN CH
1.0000 | ORAL_TABLET | Freq: Every day | ORAL | Status: DC
  Administered 2023-10-25 – 2023-11-03 (×11): 1 via ORAL
  Filled 2023-10-24 (×12): qty 1

## 2023-10-24 MED ORDER — EPOETIN ALFA-EPBX 2000 UNIT/ML IJ SOLN
2000.0000 [IU] | Freq: Once | INTRAMUSCULAR | Status: AC
  Administered 2023-10-24: 2000 [IU] via SUBCUTANEOUS
  Filled 2023-10-24: qty 1

## 2023-10-24 MED ORDER — SODIUM CHLORIDE 0.9 % IV SOLN
2.0000 g | Freq: Two times a day (BID) | INTRAVENOUS | Status: DC
  Administered 2023-10-24 – 2023-10-26 (×5): 2 g via INTRAVENOUS
  Filled 2023-10-24 (×5): qty 12.5

## 2023-10-24 NOTE — Progress Notes (Signed)
 PROGRESS NOTE    Misty [name]  FMW:[id number] DOB: March 04, 1956 DOA: 10/22/2023 PCP: Dorcus [name], MD    Brief Narrative:   Misty [name] is a 68 y.o. female with past medical history significant for squamous cell carcinoma anus currently undergoing concurrent chemoradiation, cancer related pain, urinary retention s/p Foley catheter placement (urology 2 days prior to admission), COPD, hypothyroidism, anxiety/depression who presented to [facility] ED on 10/22/2023 from home via EMS with confusion, worsening rectal pain, fever.  History obtained from patient as well as friend who is her HCPOA and at bedside at time of admission.  Overall patient having a rough time with her cancer treatments.  Over the last week has been having increasing rectal pain, stool incontinence.  Few days prior on 7/25, radiation oncology, Dr. Dewey increased her Dilaudid  from 2 mg to 4 mg every 4-6 hours as needed.  Additionally patient was seen urgently in the urology office due to urinary retention and had Foley catheter placed.  Her friend reports that since increasing the pain medication, patient has been a little more loopy that was intermittent with slow responses.  Patient's friend heard her in the bathroom making noise and went to check on her and was found to be sweaty, confused and incoherent.  Reports fever up to 101.0 at home.  Patient denies cough, no diarrhea, no chest pain, no shortness of breath.  In the ED, temperature 99.4 F, HR 99, RR 18, BP 107/73, SpO2 93% on room air.  WBC 1.9, hemoglobin 7.7, platelet count 139.  Sodium 131, potassium 3.3, chloride 102, CO2 22, glucose 113, BUN 21, creat 1.09.  AST 17, ALT 17, total bilirubin 0.7.  Lactic acid 0.8.  INR 1.1.  Urinalysis with trace leukocytes, negative nitrite, rare bacteria, 0-5 WBCs.  CT head without contrast with no acute intracranial abnormality, mild cerebral white matter changes consistent with chronic small vessel disease.  CT angiogram  chest with findings of subtle small subsegmental embolus left lingula pulmonary artery and anterior branch of right middle lobe pulmonary artery, no evidence of right heart strain, mild cardiomegaly, mild central lobar emphysematous disease, aortic atherosclerosis.  Urine culture and blood cultures x 2 obtained.  Patient received IV Dilaudid  in the ED and following, patient noted to become hypoxic and placed on 4 L nasal cannula.  TRH consulted for admission for further evaluation management of acute metabolic encephalopathy, acute pulmonary embolism, weakness, debility, uncontrolled pain of malignancy.  Assessment & Plan:   Acute metabolic encephalopathy: Resolved Patient presents with confusion, currently undergoing chemoradiation with recent increase of her pain regimen of Dilaudid  from 2 mg to 4 mg every 4-6 hours.  Friend noted that confusion started thereafter.  Patient also with noted fever, also positive for pulmonary embolism on CT imaging.  CT head without contrast with no acute intracranial abnormality.  Urinalysis unrevealing, no focal consolidation noted on CT chest imaging suggestive of a pneumonia.  Dilaudid  has been discontinued and patient's mental status appears back to baseline. -- Supportive care -- Treatment as below  Febrile neutropenia No clear source of infection.  CT chest with no concern for pneumonia. -- Blood cultures x 2: No growth x 2 days -- Urine culture: No growth -- Start cefepime, plan 5-day course -- Monitor fever curve, supportive care, antipyretics  Acute pulmonary embolism Patient presenting with fever.  No infectious etiology elucidated.  CT chest angiogram with findings of subtle small subsegmental embolus left lingula pulmonary artery and anterior branch of right middle lobe  pulmonary artery, no evidence of right heart strain -- Started on Eliquis   Hypokalemia Repleted, potassium 3.5 today, will continue repletion -- Repeat electrolytes in the  a.m.  Jehovah's Witness/blood product refusal Anemia of chronic medical disease Anemia panel with iron 19, TIBC 160, ferritin 387, folate 37.8, B12 718. -- Hgb 7.7>7.3>6.7 -- Patient Jehovah's Witness, refusing blood transfusion -- CBC daily  Urinary retention Foley catheter placed by urology 2 days prior to admission. -- Continue Foley catheter -- Outpatient follow-up with urology  Squamous cell carcinoma anus -- Medical oncology following, appreciate assistance -- Currently on chemoradiation  Pain of malignancy Patient recently had her home regimen of Dilaudid  increased from 2 mg to 4 mg p.o. 4-6 hours as needed.  Family has noted that after this increase, her confusion worsened. -- Discontinue Dilaudid  -- Start oxycodone  5-10 mg p.o. every 4 hours as needed moderate/severe pain -- Gabapentin  reduced to 300 mg p.o. twice daily -- Continue home baclofen  10 mg p.o. 3 times daily as needed muscle spasms  Hypothyroidism -- Levothyroxine  25 mcg p.o. daily  Hyperlipidemia -- Atorvastatin  20 mg p.o. daily  Anxiety/depression -- Bupropion  3 mL p.o. every morning -- Valium  5 mg p.o. twice daily as needed anxiety  COPD -- Breztri  2 puffs twice daily -- Singulair  10 mg p.o. nightly -- Albuterol  neb every 2 hours.  Wheezing/shortness of breath  Mucositis -- Magic mouthwash  Insomnia -- Trazodone  25 mg p.o. nightly as needed sleep  Weakness/ability/deconditioning/gait disturbance: -- PT/OT recommend SNF placement -- TOC consulted for placement   DVT prophylaxis:  apixaban  (ELIQUIS ) tablet 10 mg  apixaban  (ELIQUIS ) tablet 5 mg    Code Status: Full Code Family Communication: No family present at bedside this morning  Disposition Plan:  Level of care: Med-Surg Status is: Inpatient Remains inpatient appropriate because: IV antibiotics, will eventually need SNF placement    Consultants:  Medical oncology, Dr. Lanny  Procedures:  none  Antimicrobials:   None   Subjective: Patient seen examined bedside, lying in bed.  Confusion remains resolved.  Hemoglobin dropped to 6.7 today, refusing blood product transfusion given Jehovah's Witness status.  Going down to radiation today.  Started cefepime due to febrile neutropenia, although no infectious source identified.  No other specific questions, concerns or complaints at this time.  Seen by PT and OT with recommendation of SNF placement, TOC consulted.  Denies headache, no visual changes, no chest pain, no palpitations, no shortness of breath, no abdominal pain, no current fever, no chills/night sweats, no nausea/vomiting/diarrhea, no focal weakness, no fatigue, no paresthesias.  No acute events overnight per nursing staff.  Objective: Vitals:   10/23/23 2111 10/24/23 0521 10/24/23 0831 10/24/23 1221  BP: (!) 115/93 (!) 102/49    Pulse: 87 93    Resp: 14 16    Temp: 98.6 F (37 C) (!) 100.6 F (38.1 C)  (!) 100.7 F (38.2 C)  TempSrc: Oral Oral  Oral  SpO2: 100% 93% 95%   Weight:      Height:        Intake/Output Summary (Last 24 hours) at 10/24/2023 1226 Last data filed at 10/24/2023 0900 Gross per 24 hour  Intake 240 ml  Output 1100 ml  Net -860 ml   Filed Weights   10/22/23 1629  Weight: 72 kg    Examination:  Physical Exam: GEN: NAD, alert and oriented x 3, elderly/ill in appearance HEENT: NCAT, PERRL, EOMI, sclera clear, MMM PULM: CTAB w/o wheezes/crackles, normal respiratory effort, on room air CV:  RRR w/o M/G/R GI: abd soft, NTND, + BS MSK: no peripheral edema, moves all extremities independently NEURO: No focal neurological deficit PSYCH: normal mood/affect Integumentary: No concerning rashes/lesions/wounds none exposed skin surfaces    Data Reviewed: I have personally reviewed following labs and imaging studies  CBC: Recent Labs  Lab 10/20/23 0941 10/22/23 0405 10/23/23 0636 10/24/23 0556  WBC 4.1 1.9* 0.9* 0.5*  NEUTROABS 2.7 1.3*  --   --   HGB 8.8*  7.7* 7.3* 6.7*  HCT 26.9* 24.1* 23.5* 21.9*  MCV 96.1 98.4 99.6 100.0  PLT 172 139* 106* 100*   Basic Metabolic Panel: Recent Labs  Lab 10/20/23 0941 10/22/23 0405 10/23/23 0636 10/24/23 0556  NA 138 131* 134* 131*  K 3.6 3.3* 3.1* 3.5  CL 103 102 106 105  CO2 29 22 20* 19*  GLUCOSE 101* 113* 116* 135*  BUN 26* 21 9 10   CREATININE 1.24* 1.09* 1.10* 0.98  CALCIUM  9.1 8.5* 7.6* 7.8*  MG  --   --  2.2 2.0   GFR: Estimated Creatinine Clearance: 52.9 mL/min (by C-G formula based on SCr of 0.98 mg/dL). Liver Function Tests: Recent Labs  Lab 10/20/23 0941 10/22/23 0405  AST 11* 17  ALT 15 17  ALKPHOS 70 61  BILITOT 0.4 0.7  PROT 6.5 5.8*  ALBUMIN 3.6 3.0*   No results for input(s): LIPASE, AMYLASE in the last 168 hours. No results for input(s): AMMONIA in the last 168 hours. Coagulation Profile: Recent Labs  Lab 10/22/23 0405 10/24/23 1105  INR 1.1 1.9*   Cardiac Enzymes: No results for input(s): CKTOTAL, CKMB, CKMBINDEX, TROPONINI in the last 168 hours. BNP (last 3 results) No results for input(s): PROBNP in the last 8760 hours. HbA1C: No results for input(s): HGBA1C in the last 72 hours. CBG: No results for input(s): GLUCAP in the last 168 hours. Lipid Profile: No results for input(s): CHOL, HDL, LDLCALC, TRIG, CHOLHDL, LDLDIRECT in the last 72 hours. Thyroid Function Tests: No results for input(s): TSH, T4TOTAL, FREET4, T3FREE, THYROIDAB in the last 72 hours. Anemia Panel: Recent Labs    10/24/23 0556  VITAMINB12 718  FOLATE 37.8  FERRITIN 387*  TIBC 168*  IRON 19*  RETICCTPCT 1.4   Sepsis Labs: Recent Labs  Lab 10/22/23 0416  LATICACIDVEN 0.8    Recent Results (from the past 240 hours)  Blood Culture (routine x 2)     Status: None (Preliminary result)   Collection Time: 10/22/23  4:05 AM   Specimen: BLOOD  Result Value Ref Range Status   Specimen Description   Final    BLOOD RIGHT  ANTECUBITAL Performed at [facility], [address], [place], [place] [address]    Special Requests   Final    BOTTLES DRAWN AEROBIC AND ANAEROBIC Blood Culture results may not be optimal due to an inadequate volume of blood received in culture bottles Performed at [facility], [address], [place], [place] [address]    Culture   Final    NO GROWTH 2 DAYS Performed at [facility], [address] [place], [place] [address]    Report Status PENDING  Incomplete  Blood Culture (routine x 2)     Status: None (Preliminary result)   Collection Time: 10/22/23  5:25 AM   Specimen: BLOOD LEFT WRIST  Result Value Ref Range Status   Specimen Description   Final    BLOOD LEFT WRIST Performed at [facility], [address], [place], [place] [address]    Special Requests   Final    BOTTLES DRAWN AEROBIC AND ANAEROBIC Blood Culture results may not be optimal due to an inadequate volume of blood received in culture bottles Performed at [facility], [address], [place], [place] [address]    Culture   Final    NO GROWTH 2 DAYS Performed at [facility], [address] [place], [place] [address]    Report Status PENDING  Incomplete  Urine Culture (for pregnant, neutropenic or urologic patients or patients with an indwelling urinary catheter)     Status: None   Collection Time: 10/23/23  7:20 AM   Specimen: Urine, Catheterized  Result Value Ref Range Status   Specimen Description   Final    URINE, CATHETERIZED Performed at [facility], [address], [place], [place] [address]    Special Requests   Final    NONE Performed at [facility], [address], [place], [place] [address]    Culture   Final    NO GROWTH Performed at [facility], [address], [place], [place] [address]    Report Status 10/24/2023 FINAL  Final         Radiology Studies: No  results found.       Scheduled Meds:  apixaban   10 mg Oral BID   Followed by   NOREEN ON 10/29/2023] apixaban   5 mg Oral BID   atorvastatin   20 mg Oral Daily   budesonide -glycopyrrolate -formoterol   2 puff Inhalation BID   buPROPion   450 mg Oral q morning   Chlorhexidine  Gluconate Cloth  6 each Topical Daily   gabapentin   300 mg Oral BID   lamoTRIgine   200 mg Oral QHS   levothyroxine   75 mcg Oral Q0600   liver oil-zinc  oxide   Topical BID   loperamide   2 mg Oral Once   loratadine   10 mg Oral Daily   montelukast   10 mg Oral QHS   valACYclovir   500 mg Oral Daily   Continuous Infusions:  ceFEPime (MAXIPIME) IV 2 g (10/24/23 0905)     LOS: 0 days    Time spent: 52 minutes spent on 10/24/2023 caring for this patient face-to-face including chart review, ordering labs/tests, documenting, discussion with nursing staff, consultants, updating family and interview/physical exam    Camellia PARAS [place], DO [facility] Available via Epic secure chat 7am-7pm After these hours, please refer to coverage provider listed on [email or web address] 10/24/2023, 12:26 PM

## 2023-10-24 NOTE — Progress Notes (Signed)
 Misty [name]   DOB:November 30, 1955   FM#:[id number]   RDW#:[id number]  Oncology follow-up note  Subjective: Patient had fever this morning, she is neutropenia, on broad antibiotics.  ID workup obtained.  She is overall feeling better, no bowel movement today, rectal discomfort has improved.   Objective:  Vitals:   10/24/23 1417 10/24/23 1526  BP:    Pulse:    Resp:    Temp: (!) 102.2 F (39 C) 99.5 F (37.5 C)  SpO2:      Body mass index is 28.12 kg/m.  Intake/Output Summary (Last 24 hours) at 10/24/2023 1552 Last data filed at 10/24/2023 1300 Gross per 24 hour  Intake 360 ml  Output 1200 ml  Net -840 ml     Sclerae unicteric  Oropharynx clear  No peripheral adenopathy  Lungs clear -- no rales or rhonchi  Heart regular rate and rhythm  Abdomen benign  MSK no focal spinal tenderness, no peripheral edema  Neuro nonfocal    CBG (last 3)  No results for input(s): GLUCAP in the last 72 hours.   Labs:  Lab Results  Component Value Date   WBC 0.5 (LL) 10/24/2023   HGB 6.7 (LL) 10/24/2023   HCT 21.9 (L) 10/24/2023   MCV 100.0 10/24/2023   PLT 100 (L) 10/24/2023   NEUTROABS 1.3 (L) 10/22/2023     Urine Studies No results for input(s): UHGB, CRYS in the last 72 hours.  Invalid input(s): UACOL, UAPR, USPG, UPH, UTP, UGL, UKET, UBIL, UNIT, UROB, [place], UEPI, UWBC, CORINN JERROL BURNS [place], [place]  Basic Metabolic Panel: Recent Labs  Lab 10/20/23 0941 10/22/23 0405 10/23/23 0636 10/24/23 0556  NA 138 131* 134* 131*  K 3.6 3.3* 3.1* 3.5  CL 103 102 106 105  CO2 29 22 20* 19*  GLUCOSE 101* 113* 116* 135*  BUN 26* 21 9 10   CREATININE 1.24* 1.09* 1.10* 0.98  CALCIUM  9.1 8.5* 7.6* 7.8*  MG  --   --  2.2 2.0   GFR Estimated Creatinine Clearance: 52.9 mL/min (by C-G formula based on SCr of 0.98 mg/dL). Liver Function Tests: Recent Labs  Lab 10/20/23 0941 10/22/23 0405  AST 11* 17  ALT 15 17  ALKPHOS 70 61  BILITOT  0.4 0.7  PROT 6.5 5.8*  ALBUMIN 3.6 3.0*   No results for input(s): LIPASE, AMYLASE in the last 168 hours. No results for input(s): AMMONIA in the last 168 hours. Coagulation profile Recent Labs  Lab 10/22/23 0405 10/24/23 1105  INR 1.1 1.9*    CBC: Recent Labs  Lab 10/20/23 0941 10/22/23 0405 10/23/23 0636 10/24/23 0556  WBC 4.1 1.9* 0.9* 0.5*  NEUTROABS 2.7 1.3*  --   --   HGB 8.8* 7.7* 7.3* 6.7*  HCT 26.9* 24.1* 23.5* 21.9*  MCV 96.1 98.4 99.6 100.0  PLT 172 139* 106* 100*   Cardiac Enzymes: No results for input(s): CKTOTAL, CKMB, CKMBINDEX, TROPONINI in the last 168 hours. BNP: Invalid input(s): POCBNP CBG: No results for input(s): GLUCAP in the last 168 hours. D-Dimer No results for input(s): DDIMER in the last 72 hours. Hgb A1c No results for input(s): HGBA1C in the last 72 hours. Lipid Profile No results for input(s): CHOL, HDL, LDLCALC, TRIG, CHOLHDL, LDLDIRECT in the last 72 hours. Thyroid function studies No results for input(s): TSH, T4TOTAL, T3FREE, THYROIDAB in the last 72 hours.  Invalid input(s): FREET3 Anemia work up Recent Labs    10/24/23 0556  VITAMINB12 718  FOLATE 37.8  FERRITIN 387*  TIBC  168*  IRON 19*  RETICCTPCT 1.4   Microbiology Recent Results (from the past 240 hours)  Blood Culture (routine x 2)     Status: None (Preliminary result)   Collection Time: 10/22/23  4:05 AM   Specimen: BLOOD  Result Value Ref Range Status   Specimen Description   Final    BLOOD RIGHT ANTECUBITAL Performed at [facility], [address], [place], [place] [address]    Special Requests   Final    BOTTLES DRAWN AEROBIC AND ANAEROBIC Blood Culture results may not be optimal due to an inadequate volume of blood received in culture bottles Performed at [facility], [address], [place], [place] [address]    Culture   Final    NO GROWTH 2 DAYS Performed at [facility], [address] [place], [place] [address]    Report Status PENDING  Incomplete  Blood Culture (routine x 2)     Status: None (Preliminary result)   Collection Time: 10/22/23  5:25 AM   Specimen: BLOOD LEFT WRIST  Result Value Ref Range Status   Specimen Description   Final    BLOOD LEFT WRIST Performed at [facility], [address] [place], [place] [address]    Special Requests   Final    BOTTLES DRAWN AEROBIC AND ANAEROBIC Blood Culture results may not be optimal due to an inadequate volume of blood received in culture bottles Performed at [facility], [address] [place], [place] [address]    Culture   Final    NO GROWTH 2 DAYS Performed at [facility], [address] [place], [place] [address]    Report Status PENDING  Incomplete  Urine Culture (for pregnant, neutropenic or urologic patients or patients with an indwelling urinary catheter)     Status: None   Collection Time: 10/23/23  7:20 AM   Specimen: Urine, Catheterized  Result Value Ref Range Status   Specimen Description   Final    URINE, CATHETERIZED Performed at [facility], [address] [place], [place] [address]    Special Requests   Final    NONE Performed at [facility], [address] [place], [place] [address]    Culture   Final    NO GROWTH Performed at [facility], [address] [place], [place] [address]    Report Status 10/24/2023 FINAL  Final      Studies:  No results found.  Assessment: 68 y.o. female   Neutropenic fever Acute pulmonary embolism Worsening anemia secondary to chemotherapy, patient is a TEFL teacher Witness Acute metabolic encephalopathy, resolved And no cancer, on concurrent chemoradiation Deconditioning   Plan:  - On broad antibiotics for neutropenic fever, cultures and CT scan has been negative so far - Hemoglobin dropped to 6.7 today, patient declines blood  transfusion.  I discussed the benefit and potential side effects of ESA, with small risk of tumor gross.  After detailed discussion, she agrees to proceed.  I ordered Retacrit 2000 units today, and may give 1 more dose next week before discharge. - Continue Eliquis , but will hold if platelets drop below 50 - Continue supportive care - I will follow-up   Onita Mattock, MD 10/24/2023  3:52 PM

## 2023-10-24 NOTE — Progress Notes (Signed)
 Occupational Therapy Treatment Patient Details Name: Misty [name] MRN: [id number] DOB: 12/06/1955 Today's Date: 10/24/2023   History of present illness Misty [name] is a 68 yr old female who presented with worsening of anal pain, fecal incontinence, confusion and development of fever. 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 Given increased pain, she had difficulty with upright sitting and required assist for repositioning for improved comfort. She required CGA to min assist for toileting at bathroom level and for grooming standing at the sink. She further required set-up assist in sitting for upper body dressing. She was assisted to the bedside chair, however she needed to be positioned as reclined in the chair, due to pain. Continue OT plan of care. Patient will benefit from continued inpatient follow up therapy, <3 hours/day.       If plan is discharge home, recommend the following:  Assistance with cooking/housework;Assist for transportation;A little help with bathing/dressing/bathroom;A little help with walking and/or transfers   Equipment Recommendations  None recommended by OT    Recommendations for Other Services      Precautions / Restrictions Precautions Precautions: Fall Precaution/Restrictions Comments: concurrent chemoradiation Restrictions Weight Bearing Restrictions Per Provider Order: No       Mobility Bed Mobility Overal bed mobility: Needs Assistance Bed Mobility: Supine to Sit     Supine to sit: Contact guard          Transfers Overall transfer level: Needs assistance Equipment used: Rolling walker (2 wheels) Transfers: Sit to/from Stand Sit to Stand: Min assist          Balance       Sitting balance - Comments: static sitting-good. dynamic  sitting-fair (limited by buttocks pain and guarding)       Standing balance comment: CGA to min assist with RW         ADL either performed or assessed with clinical judgement   ADL Overall ADL's : Needs assistance/impaired     Grooming: Contact guard assist;Standing Grooming Details (indicate cue type and reason): She required intermittent assist for steadying while performing teeth brushing and hand washing at sink level. She required verbal cues to step closer to sink for added safety.         Upper Body Dressing : Set up;Sitting Upper Body Dressing Details (indicate cue type and reason): She doffed then donned a clean hospital gown seated on a bedside commode in the bathroom.     Toilet Transfer: Contact guard assist;Rolling walker (2 wheels);Ambulation;Grab bars Toilet Transfer Details (indicate cue type and reason): She ambulated into the bathroom using a RW. She required assist for steadying, cues for walker placement, and cues to use grab bar as needed for support. Toileting- Clothing Manipulation and Hygiene: Contact guard assist;Minimal assistance;Sit to/from stand Toileting - Clothing Manipulation Details (indicate cue type and reason): She performed toileting at bathroom level, requiring intermittent steadying assist in standing and cues for walker placement. She performed anterior and posterior hygiene in sitting with SBA.                      Communication Communication Communication: No apparent difficulties   Cognition Arousal: Alert Behavior During Therapy: WFL for tasks assessed/performed        Following commands: Intact  Pertinent Vitals/ Pain       Pain Assessment Pain Assessment: 0-10 Pain Score: 7  Pain Location: bottom Pain Descriptors / Indicators: Discomfort, Grimacing, Guarding Pain Intervention(s): Limited activity within patient's tolerance, Monitored during session, Repositioned   Frequency  Min 2X/week         Progress Toward Goals  OT Goals(current goals can now be found in the care plan section)     Acute Rehab OT Goals Patient Stated Goal: to get better OT Goal Formulation: With patient Time For Goal Achievement: 11/06/23 Potential to Achieve Goals: Good  Plan         AM-PAC OT 6 Clicks Daily Activity     Outcome Measure   Help from another person eating meals?: None Help from another person taking care of personal grooming?: A Little Help from another person toileting, which includes using toliet, bedpan, or urinal?: A Little Help from another person bathing (including washing, rinsing, drying)?: A Little Help from another person to put on and taking off regular upper body clothing?: A Little Help from another person to put on and taking off regular lower body clothing?: A Little 6 Click Score: 19    End of Session Equipment Utilized During Treatment: Rolling walker (2 wheels)  OT Visit Diagnosis: Unsteadiness on feet (R26.81);Pain;Muscle weakness (generalized) (M62.81) Pain - part of body:  (anus/buttocks)   Activity Tolerance Patient limited by pain   Patient Left in chair;with call bell/phone within reach;with family/visitor present   Nurse Communication Mobility status        Time: 8544-8475 OT Time Calculation (min): 29 min  Charges: OT General Charges $OT Visit: 1 Visit OT Treatments $Self Care/Home Management : 23-37 mins     Delanna LITTIE Molt, OTR/L 10/24/2023, 5:13 PM

## 2023-10-24 NOTE — TOC Initial Note (Signed)
 Transition of Care [facility]) - Initial/Assessment Note    Patient Details  Name: Misty [name] MRN: [id number] Date of Birth: 09-23-1955  [facility]) CM/SW Contact:    Toy LITTIE Agar, RN Phone Number:[phone]  10/24/2023, 1:33 PM  Clinical Narrative:                 IP care management consulted for patient with SNF recommendations. CM at bedside introduces self and explained. Patient and friend at bedside are both agreeable to moving forward with bed search. Patient first preference is [facility]. CM made patient aware that CM will fax out and return with offers for final decision. FL2 completed PASRR pending . Info has been faxed out. Patient currently not medically ready for d/c . IP care manager to continue to follow.   Expected Discharge Plan: Skilled Nursing Facility Barriers to Discharge: Continued Medical Work up   Patient Goals and CMS Choice Patient states their goals for this hospitalization and ongoing recovery are:: Wants to be able to get stronger and go home. CMS Medicare.gov Compare Post Acute Care list provided to:: Patient Choice offered to / list presented to : Patient, HC POA / Guardian [place] ownership interest in [facility].provided to:: Patient    Expected Discharge Plan and Services In-house Referral: NA Discharge Planning Services: CM Consult Post Acute Care Choice: IP Rehab Living arrangements for the past 2 months: Apartment                 DME Arranged: N/A DME Agency: NA       HH Arranged: NA HH Agency: NA        Prior Living Arrangements/Services Living arrangements for the past 2 months: Apartment Lives with:: Self Patient language and need for interpreter reviewed:: Yes Do you feel safe going back to the place where you live?: Yes      Need for Family Participation in Patient Care: No (Comment) Care giver support system in place?: Yes (comment) Current home services:  (n/a) Criminal Activity/Legal  Involvement Pertinent to Current Situation/Hospitalization: No - Comment as needed  Activities of Daily Living   ADL Screening (condition at time of admission) Independently performs ADLs?: Yes (appropriate for developmental age) Is the patient deaf or have difficulty hearing?: No Does the patient have difficulty seeing, even when wearing glasses/contacts?: No Does the patient have difficulty concentrating, remembering, or making decisions?: Yes  Permission Sought/Granted Permission sought to share information with : Family Supports Permission granted to share information with : Yes, Verbal Permission Granted  Share Information with NAME: Leea Pessalon     Permission granted to share info w Relationship: friend  Permission granted to share info w Contact Information: [phone]  Emotional Assessment Appearance:: Appears stated age Attitude/Demeanor/Rapport: Gracious Affect (typically observed): Quiet, Pleasant, Overwhelmed Orientation: : Oriented to Self, Oriented to Place, Oriented to  Time, Oriented to Situation Alcohol / Substance Use: Not Applicable Psych Involvement: No (comment)  Admission diagnosis:  Hypokalemia [E87.6] Hyponatremia [E87.1] Encephalopathy, metabolic [G93.41] Renal insufficiency [N28.9] Elevated random blood glucose level [R73.9] Pancytopenia (HCC) [D61.818] Altered mental status, unspecified altered mental status type [R41.82] Febrile neutropenia (HCC) [D70.9, R50.81] Patient Active Problem List   Diagnosis Date Noted   Febrile neutropenia (HCC) 10/24/2023   Encephalopathy, metabolic 10/22/2023   Dehydration 09/19/2023   Nausea with vomiting 09/19/2023   Port-A-Cath in place 09/15/2023   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   Hypothyroidism 10/09/2019   HLD (hyperlipidemia) 10/09/2019   Bipolar 1  disorder (HCC) 10/09/2019   COPD exacerbation (HCC) 10/08/2019   COPD GOLD II 07/17/2013   Smoker 07/17/2013   PCP:  Dorcus [name],  MD Pharmacy:   [facility] [phone] - [place], [place] - [address] AT [facility] [address] [facility] [address] [place] [address] Phone: [phone] Fax: [phone]  [place] [facility] [address] [place] [address] Phone: [phone] Fax: [phone]     Social Drivers of Health (SDOH) Social History: SDOH Screenings   Food Insecurity: No Food Insecurity (10/22/2023)  Recent Concern: Food Insecurity - Food Insecurity Present (08/17/2023)   Received from [facility]  Housing: Low Risk  (10/22/2023)  Transportation Needs: No Transportation Needs (10/22/2023)  Utilities: Not At Risk (10/22/2023)  Depression (PHQ2-9): Low Risk  (10/20/2023)  Financial Resource Strain: High Risk (08/17/2023)   Received from [facility]  Physical Activity: Insufficiently Active (08/17/2023)   Received from [facility]  Social Connections: Moderately Integrated (10/22/2023)  Stress: No Stress Concern Present (08/17/2023)   Received from [facility]  Tobacco Use: Medium Risk (10/22/2023)   SDOH Interventions:     Readmission Risk Interventions     No data to display

## 2023-10-24 NOTE — Plan of Care (Signed)
  Problem: Education: Goal: Knowledge of General Education information will improve Description: Including pain rating scale, medication(s)/side effects and non-pharmacologic comfort measures 10/24/2023 1746 by Theo Isaiah MATSU, LPN Outcome: Progressing 10/24/2023 1746 by Theo Isaiah MATSU, LPN Outcome: Not Progressing   Problem: Health Behavior/Discharge Planning: Goal: Ability to manage health-related needs will improve 10/24/2023 1746 by Theo Isaiah MATSU, LPN Outcome: Progressing 10/24/2023 1746 by Theo Isaiah MATSU, LPN Outcome: Not Progressing   Problem: Clinical Measurements: Goal: Ability to maintain clinical measurements within normal limits will improve 10/24/2023 1746 by Theo Isaiah MATSU, LPN Outcome: Progressing 10/24/2023 1746 by Theo Isaiah MATSU, LPN Outcome: Not Progressing Goal: Will remain free from infection 10/24/2023 1746 by Theo Isaiah MATSU, LPN Outcome: Progressing 10/24/2023 1746 by Theo Isaiah MATSU, LPN Outcome: Not Progressing Goal: Diagnostic test results will improve 10/24/2023 1746 by Theo Isaiah MATSU, LPN Outcome: Progressing 10/24/2023 1746 by Theo Isaiah MATSU, LPN Outcome: Not Progressing Goal: Respiratory complications will improve 10/24/2023 1746 by Theo Isaiah MATSU, LPN Outcome: Progressing 10/24/2023 1746 by Theo Isaiah MATSU, LPN Outcome: Not Progressing Goal: Cardiovascular complication will be avoided 10/24/2023 1746 by Theo Isaiah MATSU, LPN Outcome: Progressing 10/24/2023 1746 by Theo Isaiah MATSU, LPN Outcome: Not Progressing   Problem: Activity: Goal: Risk for activity intolerance will decrease 10/24/2023 1746 by Theo Isaiah MATSU, LPN Outcome: Progressing 10/24/2023 1746 by Theo Isaiah MATSU, LPN Outcome: Not Progressing   Problem: Nutrition: Goal: Adequate nutrition will be maintained 10/24/2023 1746 by Theo Isaiah MATSU, LPN Outcome: Progressing 10/24/2023 1746 by Theo Isaiah MATSU, LPN Outcome: Not Progressing   Problem:  Coping: Goal: Level of anxiety will decrease 10/24/2023 1746 by Theo Isaiah MATSU, LPN Outcome: Progressing 10/24/2023 1746 by Theo Isaiah MATSU, LPN Outcome: Not Progressing   Problem: Elimination: Goal: Will not experience complications related to bowel motility 10/24/2023 1746 by Theo Isaiah MATSU, LPN Outcome: Progressing 10/24/2023 1746 by Theo Isaiah MATSU, LPN Outcome: Not Progressing Goal: Will not experience complications related to urinary retention 10/24/2023 1746 by Theo Isaiah MATSU, LPN Outcome: Progressing 10/24/2023 1746 by Theo Isaiah MATSU, LPN Outcome: Not Progressing   Problem: Pain Managment: Goal: General experience of comfort will improve and/or be controlled 10/24/2023 1746 by Theo Isaiah MATSU, LPN Outcome: Progressing 10/24/2023 1746 by Theo Isaiah MATSU, LPN Outcome: Not Progressing   Problem: Safety: Goal: Ability to remain free from injury will improve 10/24/2023 1746 by Theo Isaiah MATSU, LPN Outcome: Progressing 10/24/2023 1746 by Theo Isaiah MATSU, LPN Outcome: Not Progressing   Problem: Skin Integrity: Goal: Risk for impaired skin integrity will decrease 10/24/2023 1746 by Theo Isaiah MATSU, LPN Outcome: Progressing 10/24/2023 1746 by Theo Isaiah MATSU, LPN Outcome: Not Progressing

## 2023-10-24 NOTE — Plan of Care (Signed)
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
## 2023-10-25 DIAGNOSIS — G9341 Metabolic encephalopathy: Secondary | ICD-10-CM | POA: Diagnosis not present

## 2023-10-25 DIAGNOSIS — Z789 Other specified health status: Secondary | ICD-10-CM | POA: Diagnosis present

## 2023-10-25 LAB — CBC
HCT: 21.8 % — ABNORMAL LOW (ref 36.0–46.0)
Hemoglobin: 6.7 g/dL — CL (ref 12.0–15.0)
MCH: 31 pg (ref 26.0–34.0)
MCHC: 30.7 g/dL (ref 30.0–36.0)
MCV: 100.9 fL — ABNORMAL HIGH (ref 80.0–100.0)
Platelets: 80 K/uL — ABNORMAL LOW (ref 150–400)
RBC: 2.16 MIL/uL — ABNORMAL LOW (ref 3.87–5.11)
RDW: 17 % — ABNORMAL HIGH (ref 11.5–15.5)
WBC: 0.5 K/uL — CL (ref 4.0–10.5)
nRBC: 0 % (ref 0.0–0.2)

## 2023-10-25 LAB — BASIC METABOLIC PANEL WITH GFR
Anion gap: 4 — ABNORMAL LOW (ref 5–15)
BUN: 11 mg/dL (ref 8–23)
CO2: 18 mmol/L — ABNORMAL LOW (ref 22–32)
Calcium: 7.7 mg/dL — ABNORMAL LOW (ref 8.9–10.3)
Chloride: 107 mmol/L (ref 98–111)
Creatinine, Ser: 0.91 mg/dL (ref 0.44–1.00)
GFR, Estimated: >60 mL/min (ref >60)
Glucose, Bld: 98 mg/dL (ref 70–99)
Potassium: 4.3 mmol/L (ref 3.5–5.1)
Sodium: 129 mmol/L — ABNORMAL LOW (ref 135–145)

## 2023-10-25 NOTE — Plan of Care (Signed)

## 2023-10-25 NOTE — Progress Notes (Signed)
   10/25/23 0753  Oxygen Therapy/Pulse Ox  SpO2 (!) (S)  87 % (Applied 2 L [place], Sp02 increased to 94%.)   87% on room air, pt desaturated with ambulation to restroom per RN.

## 2023-10-25 NOTE — Progress Notes (Signed)
 PROGRESS NOTE    Misty [name]  FMW:[id number] DOB: 1955/08/28 DOA: 10/22/2023 PCP: Dorcus [name], MD    Brief Narrative:   Misty [name] is a 68 y.o. female with past medical history significant for squamous cell carcinoma anus currently undergoing concurrent chemoradiation, cancer related pain, urinary retention s/p Foley catheter placement (urology 2 days prior to admission), COPD, hypothyroidism, anxiety/depression who presented to [facility] ED [name] 10/22/2023 from home via EMS with confusion, worsening rectal pain, fever.  History obtained from patient as well as friend who is her HCPOA and at bedside at time of admission.  Overall patient having a rough time with her cancer treatments.  Over the last week has been having increasing rectal pain, stool incontinence.  Few days prior [name] 7/25, radiation oncology, Dr. Dewey increased her Dilaudid  from 2 mg to 4 mg every 4-6 hours as needed.  Additionally patient was seen urgently in the urology office due to urinary retention and had Foley catheter placed.  Her friend reports that since increasing the pain medication, patient has been a little more loopy that was intermittent with slow responses.  Patient's friend heard her in the bathroom making noise and went to check [name] her and was found to be sweaty, confused and incoherent.  Reports fever up to 101.0 at home.  Patient denies cough, no diarrhea, no chest pain, no shortness of breath.  In the ED, temperature 99.4 F, HR 99, RR 18, BP 107/73, SpO2 93% [name] room air.  WBC 1.9, hemoglobin 7.7, platelet count 139.  Sodium 131, potassium 3.3, chloride 102, CO2 22, glucose 113, BUN 21, creat 1.09.  AST 17, ALT 17, total bilirubin 0.7.  Lactic acid 0.8.  INR 1.1.  Urinalysis with trace leukocytes, negative nitrite, rare bacteria, 0-5 WBCs.  CT head without contrast with no acute intracranial abnormality, mild cerebral white matter changes consistent with chronic small vessel disease.  CT angiogram  chest with findings of subtle small subsegmental embolus left lingula pulmonary artery and anterior branch of right middle lobe pulmonary artery, no evidence of right heart strain, mild cardiomegaly, mild central lobar emphysematous disease, aortic atherosclerosis.  Urine culture and blood cultures x 2 obtained.  Patient received IV Dilaudid  in the ED and following, patient noted to become hypoxic and placed [name] 4 L nasal cannula.  TRH consulted for admission for further evaluation management of acute metabolic encephalopathy, acute pulmonary embolism, weakness, debility, uncontrolled pain of malignancy.  Assessment & Plan:   Acute metabolic encephalopathy: Resolved Patient presents with confusion, currently undergoing chemoradiation with recent increase of her pain regimen of Dilaudid  from 2 mg to 4 mg every 4-6 hours.  Friend noted that confusion started thereafter.  Patient also with noted fever, also positive for pulmonary embolism [name] CT imaging.  CT head without contrast with no acute intracranial abnormality.  Urinalysis unrevealing, no focal consolidation noted [name] CT chest imaging suggestive of a pneumonia.  Dilaudid  has been discontinued and patient's mental status appears back to baseline. -- Supportive care -- Treatment as below  Febrile neutropenia No clear source of infection.  CT chest with no concern for pneumonia. -- Blood cultures x 2: No growth x 3 days -- Urine culture: No growth -- cefepime , plan 5-day course -- Monitor fever curve, supportive care, antipyretics  Acute pulmonary embolism Patient presenting with fever.  No infectious etiology elucidated.  CT chest angiogram with findings of subtle small subsegmental embolus left lingula pulmonary artery and anterior branch of right middle lobe pulmonary  artery, no evidence of right heart strain -- Started [name] Eliquis  -- Continue monitor CBC/platelets closely, if continues to trend down and drops below 50 will need to discontinue  anticoagulation  Hypokalemia Repleted, potassium 3.5 today, will continue repletion -- Repeat electrolytes in the a.m.  Jehovah's Witness/blood product refusal Anemia of chronic medical disease Pancytopenia Anemia panel with iron 19, TIBC 160, ferritin 387, folate 37.8, B12 718. -- Hgb 7.7>7.3>6.7 -- Plt 139>106>100>80 -- Patient Jehovah's Witness, refusing blood transfusion -- CBC daily; if platelets drop below 50 will discontinue Eliquis   Urinary retention Foley catheter placed by urology 2 days prior to admission. -- Continue Foley catheter -- Outpatient follow-up with urology  Squamous cell carcinoma anus -- Medical oncology following, appreciate assistance -- Currently [name] chemoradiation  Pain of malignancy Patient recently had her home regimen of Dilaudid  increased from 2 mg to 4 mg p.o. 4-6 hours as needed.  Family has noted that after this increase, her confusion worsened. -- Discontinue Dilaudid  -- Start oxycodone  5-10 mg p.o. every 4 hours as needed moderate/severe pain -- Gabapentin  reduced to 300 mg p.o. twice daily -- Continue home baclofen  10 mg p.o. 3 times daily as needed muscle spasms  Hypothyroidism -- Levothyroxine  25 mcg p.o. daily  Hyperlipidemia -- Atorvastatin  20 mg p.o. daily  Anxiety/depression -- Bupropion  3 mL p.o. every morning -- Valium  5 mg p.o. twice daily as needed anxiety  COPD -- Breztri  2 puffs twice daily -- Singulair  10 mg p.o. nightly -- Albuterol  neb every 2 hours.  Wheezing/shortness of breath  Mucositis -- Magic mouthwash  Insomnia -- Trazodone  25 mg p.o. nightly as needed sleep  Weakness/ability/deconditioning/gait disturbance: -- PT/OT recommend SNF placement -- TOC consulted for placement   DVT prophylaxis:  apixaban  (ELIQUIS ) tablet 10 mg  apixaban  (ELIQUIS ) tablet 5 mg    Code Status: Full Code Family Communication: No family present at bedside this morning  Disposition Plan:  Level of care: Med-Surg Status  is: Inpatient Remains inpatient appropriate because: IV antibiotics, pending SNF placement    Consultants:  Medical oncology, Dr. Lanny  Procedures:  none  Antimicrobials:  None   Subjective: Patient seen examined bedside, lying in bed.  Hemoglobin remains at 6.7 today which is stable from yesterday.  Refusing blood transfusion, received Procrit per hematology.  Remains [name] IV cefepime .  Currently afebrile but with Tmax of 102.5 past 24 hours.  Had radiation treatment yesterday.  No other specific questions, concerns or complaints at this time.  Denies headache, no visual changes, no chest pain, no palpitations, no shortness of breath, no abdominal pain, no current fever, no nausea/vomiting/diarrhea, no focal weakness, no fatigue, no paresthesias.  No acute events overnight per nursing staff.  Objective: Vitals:   10/25/23 0322 10/25/23 0500 10/25/23 0501 10/25/23 0753  BP:  (!) 93/48 (!) 93/48   Pulse:  84 84   Resp:  16    Temp: 98 F (36.7 C) 98.5 F (36.9 C)    TempSrc: Oral Oral    SpO2:  92% 90% (S) (!) 87%  Weight:      Height:        Intake/Output Summary (Last 24 hours) at 10/25/2023 1331 Last data filed at 10/25/2023 1100 Gross per 24 hour  Intake 340 ml  Output 1500 ml  Net -1160 ml   Filed Weights   10/22/23 1629  Weight: 72 kg    Examination:  Physical Exam: GEN: NAD, alert and oriented x 3, elderly/ill in appearance HEENT: NCAT, PERRL, EOMI, sclera  clear, MMM PULM: CTAB w/o wheezes/crackles, normal respiratory effort, [name] 2L [place] CV: RRR w/o M/G/R GI: abd soft, NTND, + BS MSK: no peripheral edema, moves all extremities independently NEURO: No focal neurological deficit PSYCH: normal mood/affect Integumentary: No concerning rashes/lesions/wounds none exposed skin surfaces    Data Reviewed: I have personally reviewed following labs and imaging studies  CBC: Recent Labs  Lab 10/20/23 0941 10/22/23 0405 10/23/23 0636 10/24/23 0556 10/25/23 0713   WBC 4.1 1.9* 0.9* 0.5* 0.5*  NEUTROABS 2.7 1.3*  --   --   --   HGB 8.8* 7.7* 7.3* 6.7* 6.7*  HCT 26.9* 24.1* 23.5* 21.9* 21.8*  MCV 96.1 98.4 99.6 100.0 100.9*  PLT 172 139* 106* 100* 80*   Basic Metabolic Panel: Recent Labs  Lab 10/20/23 0941 10/22/23 0405 10/23/23 0636 10/24/23 0556 10/25/23 0713  NA 138 131* 134* 131* 129*  K 3.6 3.3* 3.1* 3.5 4.3  CL 103 102 106 105 107  CO2 29 22 20* 19* 18*  GLUCOSE 101* 113* 116* 135* 98  BUN 26* 21 9 10 11   CREATININE 1.24* 1.09* 1.10* 0.98 0.91  CALCIUM  9.1 8.5* 7.6* 7.8* 7.7*  MG  --   --  2.2 2.0  --    GFR: Estimated Creatinine Clearance: 57 mL/min (by C-G formula based [name] SCr of 0.91 mg/dL). Liver Function Tests: Recent Labs  Lab 10/20/23 0941 10/22/23 0405  AST 11* 17  ALT 15 17  ALKPHOS 70 61  BILITOT 0.4 0.7  PROT 6.5 5.8*  ALBUMIN 3.6 3.0*   No results for input(s): LIPASE, AMYLASE in the last 168 hours. No results for input(s): AMMONIA in the last 168 hours. Coagulation Profile: Recent Labs  Lab 10/22/23 0405 10/24/23 1105  INR 1.1 1.9*   Cardiac Enzymes: No results for input(s): CKTOTAL, CKMB, CKMBINDEX, TROPONINI in the last 168 hours. BNP (last 3 results) No results for input(s): PROBNP in the last 8760 hours. HbA1C: No results for input(s): HGBA1C in the last 72 hours. CBG: No results for input(s): GLUCAP in the last 168 hours. Lipid Profile: No results for input(s): CHOL, HDL, LDLCALC, TRIG, CHOLHDL, LDLDIRECT in the last 72 hours. Thyroid Function Tests: No results for input(s): TSH, T4TOTAL, FREET4, T3FREE, THYROIDAB in the last 72 hours. Anemia Panel: Recent Labs    10/24/23 0556  VITAMINB12 718  FOLATE 37.8  FERRITIN 387*  TIBC 168*  IRON 19*  RETICCTPCT 1.4   Sepsis Labs: Recent Labs  Lab 10/22/23 0416  LATICACIDVEN 0.8    Recent Results (from the past 240 hours)  Blood Culture (routine x 2)     Status: None (Preliminary result)    Collection Time: 10/22/23  4:05 AM   Specimen: BLOOD  Result Value Ref Range Status   Specimen Description   Final    BLOOD RIGHT ANTECUBITAL Performed at [facility], [address] [place], [place] [address]    Special Requests   Final    BOTTLES DRAWN AEROBIC AND ANAEROBIC Blood Culture results may not be optimal due to an inadequate volume of blood received in culture bottles Performed at [facility], [address], [place], [place] [address]    Culture   Final    NO GROWTH 3 DAYS Performed at [facility], [address] [place], [place] [address]    Report Status PENDING  Incomplete  Blood Culture (routine x 2)     Status: None (Preliminary result)   Collection Time: 10/22/23  5:25 AM   Specimen: BLOOD LEFT WRIST  Result Value Ref Range Status   Specimen Description   Final    BLOOD LEFT WRIST Performed at [facility], [address], [place], [place] [address]    Special Requests   Final    BOTTLES DRAWN AEROBIC AND ANAEROBIC Blood Culture results may not be optimal due to an inadequate volume of blood received in culture bottles Performed at [facility], [address], [place], [place] [address]    Culture   Final    NO GROWTH 3 DAYS Performed at [facility], [address] [place], [place] [address]    Report Status PENDING  Incomplete  Urine Culture (for pregnant, neutropenic or urologic patients or patients with an indwelling urinary catheter)     Status: None   Collection Time: 10/23/23  7:20 AM   Specimen: Urine, Catheterized  Result Value Ref Range Status   Specimen Description   Final    URINE, CATHETERIZED Performed at [facility], [address] [place], [place] [address]    Special Requests   Final    NONE Performed at [facility], [address] [place], [place] [address]    Culture   Final    NO GROWTH Performed at [facility], [address] [place], [place] [address]    Report Status 10/24/2023 FINAL  Final         Radiology Studies: No results found.       Scheduled Meds:  apixaban   10 mg Oral BID   Followed by   Misty [name] 10/29/2023] apixaban   5 mg Oral BID   atorvastatin   20 mg Oral Daily   budesonide -glycopyrrolate -formoterol   2 puff Inhalation BID   buPROPion   450 mg Oral q morning   Chlorhexidine  Gluconate Cloth  6 each Topical Daily   gabapentin   300 mg Oral BID   lamoTRIgine   200 mg Oral QHS   levothyroxine   75 mcg Oral Q0600   liver oil-zinc  oxide   Topical BID   loperamide   2 mg Oral Once   loratadine   10 mg Oral Daily   montelukast   10 mg Oral QHS   prenatal multivitamin  1 tablet Oral Q1200   valACYclovir   500 mg Oral Daily   Continuous Infusions:  ceFEPime  (MAXIPIME ) IV 2 g (10/25/23 0748)     LOS: 1 day    Time spent: 52 minutes spent [name] 10/25/2023 caring for this patient face-to-face including chart review, ordering labs/tests, documenting, discussion with nursing staff, consultants, updating family and interview/physical exam    Camellia PARAS [place], DO [facility] Available via Epic secure chat 7am-7pm After these hours, please refer to coverage provider listed [name] [email or web address] 10/25/2023, 1:31 PM

## 2023-10-26 ENCOUNTER — Inpatient Hospital Stay (HOSPITAL_COMMUNITY): Payer: Medicare (Managed Care)

## 2023-10-26 DIAGNOSIS — G9341 Metabolic encephalopathy: Secondary | ICD-10-CM | POA: Diagnosis not present

## 2023-10-26 LAB — BASIC METABOLIC PANEL WITH GFR
Anion gap: 9 (ref 5–15)
BUN: 10 mg/dL (ref 8–23)
CO2: 19 mmol/L — ABNORMAL LOW (ref 22–32)
Calcium: 8 mg/dL — ABNORMAL LOW (ref 8.9–10.3)
Chloride: 103 mmol/L (ref 98–111)
Creatinine, Ser: 0.84 mg/dL (ref 0.44–1.00)
GFR, Estimated: >60 mL/min (ref >60)
Glucose, Bld: 124 mg/dL — ABNORMAL HIGH (ref 70–99)
Potassium: 3.1 mmol/L — ABNORMAL LOW (ref 3.5–5.1)
Sodium: 131 mmol/L — ABNORMAL LOW (ref 135–145)

## 2023-10-26 LAB — CBC
HCT: 18.5 % — ABNORMAL LOW (ref 36.0–46.0)
Hemoglobin: 5.8 g/dL — CL (ref 12.0–15.0)
MCH: 30.7 pg (ref 26.0–34.0)
MCHC: 31.4 g/dL (ref 30.0–36.0)
MCV: 97.9 fL (ref 80.0–100.0)
Platelets: 66 K/uL — ABNORMAL LOW (ref 150–400)
RBC: 1.89 MIL/uL — ABNORMAL LOW (ref 3.87–5.11)
RDW: 17.1 % — ABNORMAL HIGH (ref 11.5–15.5)
WBC: 0.3 K/uL — CL (ref 4.0–10.5)
nRBC: 0 % (ref 0.0–0.2)

## 2023-10-26 LAB — MRSA NEXT GEN BY PCR, NASAL: MRSA by PCR Next Gen: NOT DETECTED

## 2023-10-26 MED ORDER — MIDODRINE HCL 5 MG PO TABS: 5.0000 mg | ORAL_TABLET | Freq: Three times a day (TID) | ORAL | Status: DC

## 2023-10-26 MED ORDER — IOHEXOL 300 MG/ML  SOLN
80.0000 mL | Freq: Once | INTRAMUSCULAR | Status: AC | PRN
  Administered 2023-10-26: 80 mL via INTRAVENOUS

## 2023-10-26 MED ORDER — MIDODRINE HCL 5 MG PO TABS
5.0000 mg | ORAL_TABLET | Freq: Three times a day (TID) | ORAL | Status: DC
  Administered 2023-10-26 (×2): 5 mg via ORAL
  Filled 2023-10-26 (×2): qty 1

## 2023-10-26 MED ORDER — SODIUM CHLORIDE 0.9 % IV SOLN
2.0000 g | Freq: Three times a day (TID) | INTRAVENOUS | Status: AC
  Administered 2023-10-26 – 2023-10-30 (×14): 2 g via INTRAVENOUS
  Filled 2023-10-26 (×12): qty 12.5

## 2023-10-26 MED ORDER — VANCOMYCIN HCL 1250 MG/250ML IV SOLN
1250.0000 mg | INTRAVENOUS | Status: DC
  Administered 2023-10-27 – 2023-10-28 (×2): 1250 mg via INTRAVENOUS
  Filled 2023-10-26 (×2): qty 250

## 2023-10-26 MED ORDER — POTASSIUM CHLORIDE CRYS ER 10 MEQ PO TBCR
30.0000 meq | EXTENDED_RELEASE_TABLET | ORAL | Status: AC
  Administered 2023-10-26 (×3): 30 meq via ORAL
  Filled 2023-10-26 (×3): qty 1

## 2023-10-26 MED ORDER — VANCOMYCIN HCL 1500 MG/300ML IV SOLN
1500.0000 mg | Freq: Once | INTRAVENOUS | Status: AC
  Administered 2023-10-26: 1500 mg via INTRAVENOUS
  Filled 2023-10-26: qty 300

## 2023-10-26 MED ORDER — SODIUM CHLORIDE 0.9 % IV BOLUS
500.0000 mL | Freq: Once | INTRAVENOUS | Status: AC
  Administered 2023-10-26: 500 mL via INTRAVENOUS

## 2023-10-26 MED ORDER — EPOETIN ALFA 10000 UNIT/ML IJ SOLN
10000.0000 [IU] | Freq: Once | INTRAMUSCULAR | Status: AC
  Administered 2023-10-26: 10000 [IU] via SUBCUTANEOUS
  Filled 2023-10-26: qty 1

## 2023-10-26 MED ORDER — MORPHINE SULFATE (PF) 2 MG/ML IV SOLN
2.0000 mg | INTRAVENOUS | Status: DC | PRN
  Administered 2023-10-26 – 2023-10-29 (×7): 2 mg via INTRAVENOUS
  Filled 2023-10-26 (×8): qty 1

## 2023-10-26 MED ORDER — FILGRASTIM-AAFI 480 MCG/0.8ML IJ SOSY
480.0000 ug | PREFILLED_SYRINGE | Freq: Every day | INTRAMUSCULAR | Status: DC
  Administered 2023-10-26 – 2023-10-27 (×2): 480 ug via SUBCUTANEOUS
  Filled 2023-10-26 (×2): qty 0.8

## 2023-10-26 MED ORDER — ORAL CARE MOUTH RINSE: 15.0000 mL | OROMUCOSAL | Status: DC | PRN

## 2023-10-26 MED ORDER — ORAL CARE MOUTH RINSE
15.0000 mL | OROMUCOSAL | Status: DC
  Administered 2023-10-26 – 2023-11-03 (×35): 15 mL via OROMUCOSAL

## 2023-10-26 MED ORDER — EPOETIN ALFA-EPBX 10000 UNIT/ML IJ SOLN
10000.0000 [IU] | Freq: Once | INTRAMUSCULAR | Status: DC
  Filled 2023-10-26: qty 1

## 2023-10-26 NOTE — Progress Notes (Signed)
 Patient noted to have legal guardian banner on chart. Discussed with patient and secondary HCPOA present in room. Confirmed that patient does not have a court appointed legal guardian. Misty [name], the individual named as the legal guardian in the legal guardian flowsheet, is the initial HCPOA listed in the patient's advance directive, which can be found under the media tab in chart review. Misty [name] is the second person listed in the [facility] paperwork and has been very actively involved in the patient's cancer treatment and care up to this point.  Legal guardian banner and FYI removed from patient's chart

## 2023-10-26 NOTE — Progress Notes (Signed)
 Pharmacy Antibiotic Note  Misty [name] is a 68 y.o. female admitted on 10/22/2023 with confusion, worsening rectal pain and fever. She has been on cefepime  for febrile neutropenia. Pharmacy has been consulted for vancomycin  dosing.  Plan: -Vancomycin  1500 mg IV x 1 then 1250 mg IV q24h -Continue cefepime  - increase to 2 g IV q8h  Height: 5' 3 (160 cm) Weight: 72 kg (158 lb 11.7 oz) IBW/kg (Calculated) : 52.4  Temp (24hrs), Avg:99.8 F (37.7 C), Min:98.4 F (36.9 C), Max:101.4 F (38.6 C)  Recent Labs  Lab 10/22/23 0405 10/22/23 0416 10/23/23 0636 10/24/23 0556 10/25/23 0713 10/26/23 0647  WBC 1.9*  --  0.9* 0.5* 0.5* 0.3*  CREATININE 1.09*  --  1.10* 0.98 0.91 0.84  LATICACIDVEN  --  0.8  --   --   --   --     Estimated Creatinine Clearance: 61.8 mL/min (by C-G formula based on SCr of 0.84 mg/dL).    Allergies  Allergen Reactions   Codeine Nausea Only and Other (See Comments)    skin crawling Can take hydrocodone      Antimicrobials this admission: Vancomycin  8/3 >> Cefepime  8/1 >>  Dose adjustments this admission: NA  Microbiology results: 7/30 BCx: ngtd 7/31 UCx: ngF    Thank you for allowing pharmacy to be a part of this patient's care.  Stefano MARLA Bologna, PharmD, BCPS Clinical Pharmacist 10/26/2023 8:45 AM

## 2023-10-26 NOTE — Progress Notes (Addendum)
   10/26/23 0437  Vitals  Temp (!) 100.7 F (38.2 C)  Temp Source Oral  BP 137/72  MAP (mmHg) 91  BP Location Left Arm  BP Method Automatic  Patient Position (if appropriate) Lying  Pulse Rate (!) 106  Pulse Rate Source Monitor  Resp (!) 24  MEWS COLOR  MEWS Score Color Yellow  Oxygen Therapy  SpO2 94 %  O2 Device Nasal Cannula  MEWS Score  MEWS Temp 1  MEWS Systolic 0  MEWS Pulse 1  MEWS RR 1  MEWS LOC 0  MEWS Score 3   NP on-call notified of yellow MEWS, no new orders. PRN meds given for nausea and fever.

## 2023-10-26 NOTE — Progress Notes (Signed)
 Misty [name]   DOB:15-Sep-1955   FM#:[id number]   RDW#:[id number]  Oncology follow-up note  Subjective: Patient had a look and chills and a fever last night, blood pressure dropped, she was transferred to ICU stepdown unit this morning.  She still has some chills when I saw her, blood pressure stabilized.  She has abdominal bloating, did not have bowel movement today.  No nausea or vomiting.    Objective:  Vitals:   10/26/23 1230 10/26/23 1245  BP:  101/86  Pulse: 86 88  Resp: (!) 25 19  Temp:    SpO2: 97% 97%    Body mass index is 28.31 kg/m.  Intake/Output Summary (Last 24 hours) at 10/26/2023 1327 Last data filed at 10/26/2023 1200 Gross per 24 hour  Intake 509.12 ml  Output 2850 ml  Net -2340.88 ml     Sclerae unicteric  Oropharynx clear  No peripheral adenopathy  Lungs clear -- no rales or rhonchi  Heart regular rate and rhythm  Abdomen soft but distended, with mild tenderness  MSK no focal spinal tenderness, no peripheral edema  Neuro nonfocal    CBG (last 3)  No results for input(s): GLUCAP in the last 72 hours.   Labs:  Lab Results  Component Value Date   WBC 0.3 (LL) 10/26/2023   HGB 5.8 (LL) 10/26/2023   HCT 18.5 (L) 10/26/2023   MCV 97.9 10/26/2023   PLT 66 (L) 10/26/2023   NEUTROABS 1.3 (L) 10/22/2023     Urine Studies No results for input(s): UHGB, CRYS in the last 72 hours.  Invalid input(s): UACOL, UAPR, USPG, UPH, UTP, UGL, UKET, UBIL, UNIT, UROB, [place], UEPI, UWBC, CORINN NUMBERS [place], [place], [place]  Basic Metabolic Panel: Recent Labs  Lab 10/22/23 0405 10/23/23 0636 10/24/23 0556 10/25/23 0713 10/26/23 0647  NA 131* 134* 131* 129* 131*  K 3.3* 3.1* 3.5 4.3 3.1*  CL 102 106 105 107 103  CO2 22 20* 19* 18* 19*  GLUCOSE 113* 116* 135* 98 124*  BUN 21 9 10 11 10   CREATININE 1.09* 1.10* 0.98 0.91 0.84  CALCIUM  8.5* 7.6* 7.8* 7.7* 8.0*  MG  --  2.2 2.0  --   --    GFR Estimated Creatinine  Clearance: 62 mL/min (by C-G formula based on SCr of 0.84 mg/dL). Liver Function Tests: Recent Labs  Lab 10/20/23 0941 10/22/23 0405  AST 11* 17  ALT 15 17  ALKPHOS 70 61  BILITOT 0.4 0.7  PROT 6.5 5.8*  ALBUMIN 3.6 3.0*   No results for input(s): LIPASE, AMYLASE in the last 168 hours. No results for input(s): AMMONIA in the last 168 hours. Coagulation profile Recent Labs  Lab 10/22/23 0405 10/24/23 1105  INR 1.1 1.9*    CBC: Recent Labs  Lab 10/20/23 0941 10/22/23 0405 10/23/23 0636 10/24/23 0556 10/25/23 0713 10/26/23 0647  WBC 4.1 1.9* 0.9* 0.5* 0.5* 0.3*  NEUTROABS 2.7 1.3*  --   --   --   --   HGB 8.8* 7.7* 7.3* 6.7* 6.7* 5.8*  HCT 26.9* 24.1* 23.5* 21.9* 21.8* 18.5*  MCV 96.1 98.4 99.6 100.0 100.9* 97.9  PLT 172 139* 106* 100* 80* 66*   Cardiac Enzymes: No results for input(s): CKTOTAL, CKMB, CKMBINDEX, TROPONINI in the last 168 hours. BNP: Invalid input(s): POCBNP CBG: No results for input(s): GLUCAP in the last 168 hours. D-Dimer No results for input(s): DDIMER in the last 72 hours. Hgb A1c No results for input(s): HGBA1C in the last 72 hours. Lipid Profile  No results for input(s): CHOL, HDL, LDLCALC, TRIG, CHOLHDL, LDLDIRECT in the last 72 hours. Thyroid function studies No results for input(s): TSH, T4TOTAL, T3FREE, THYROIDAB in the last 72 hours.  Invalid input(s): FREET3 Anemia work up Recent Labs    10/24/23 0556  VITAMINB12 718  FOLATE 37.8  FERRITIN 387*  TIBC 168*  IRON 19*  RETICCTPCT 1.4   Microbiology Recent Results (from the past 240 hours)  Blood Culture (routine x 2)     Status: None (Preliminary result)   Collection Time: 10/22/23  4:05 AM   Specimen: BLOOD  Result Value Ref Range Status   Specimen Description   Final    BLOOD RIGHT ANTECUBITAL Performed at [facility], [address], [place], [place] [address]    Special Requests   Final    BOTTLES  DRAWN AEROBIC AND ANAEROBIC Blood Culture results may not be optimal due to an inadequate volume of blood received in culture bottles Performed at [facility], [address], [place], [place] [address]    Culture   Final    NO GROWTH 3 DAYS Performed at [facility], [address], [place], [place] [address]    Report Status PENDING  Incomplete  Blood Culture (routine x 2)     Status: None (Preliminary result)   Collection Time: 10/22/23  5:25 AM   Specimen: BLOOD LEFT WRIST  Result Value Ref Range Status   Specimen Description   Final    BLOOD LEFT WRIST Performed at [facility], [address] [place], [place] [address]    Special Requests   Final    BOTTLES DRAWN AEROBIC AND ANAEROBIC Blood Culture results may not be optimal due to an inadequate volume of blood received in culture bottles Performed at [facility], [address] [place], [place] [address]    Culture   Final    NO GROWTH 3 DAYS Performed at [facility], [address] [place], [place] [address]    Report Status PENDING  Incomplete  Urine Culture (for pregnant, neutropenic or urologic patients or patients with an indwelling urinary catheter)     Status: None   Collection Time: 10/23/23  7:20 AM   Specimen: Urine, Catheterized  Result Value Ref Range Status   Specimen Description   Final    URINE, CATHETERIZED Performed at [facility], [address], [place], [place] [address]    Special Requests   Final    NONE Performed at [facility] [address] [place], [place] [address]    Culture   Final    NO GROWTH Performed at [facility], [address] [place], [place] [address]    Report Status 10/24/2023 FINAL  Final  MRSA Next Gen by PCR, Nasal     Status: None   Collection Time: 10/26/23 11:32 AM   Specimen: Nasal Mucosa; Nasal Swab  Result Value Ref Range Status   MRSA by PCR Next Gen NOT  DETECTED NOT DETECTED Final    Comment: (NOTE) The GeneXpert MRSA Assay (FDA approved for NASAL specimens only), is one component of a comprehensive MRSA colonization surveillance program. It is not intended to diagnose MRSA infection nor to guide or monitor treatment for MRSA infections. Test performance is not FDA approved in patients less than 16 years old. Performed at [facility], [address], [place], [place] [address]  Studies:  No results found.  Assessment: 68 y.o. female   Neutropenic fever and septic shock Acute pulmonary embolism Worsening anemia secondary to chemotherapy, patient is a Jehovah's Witness Severe neutropenia secondary to chemotherapy Thrombocytopenia secondary to chemotherapy Acute metabolic encephalopathy, resolved Anal cancer, on concurrent chemoradiation Deconditioning   Plan:  - Patient is critically ill, now has developed a septic shock, in ICU. - She has worsening pancytopenia, will increase ESA, I ordered Retacrit  10K to be given today, and potentially can repeat twice this week.  I also ordered Granix  for her severe neutropenia.  Patient is a Jehovah witness, would not accept blood transfusion, I conferred with her again today. - She had recurrent fever and chills yesterday, I ordered a blood culture for today.  Last blood culture from October 22, 2023 were negative so far. - I agree with CT chest, abdomen and pelvis, to rule out abscess or other source of infection.  Her abdomen has been more distended - Will likely hold her radiation tomorrow, I will inform radiation oncology. - Follow-up tomorrow.   Onita Mattock, MD 10/26/2023  1:27 PM

## 2023-10-26 NOTE — Consult Note (Signed)
 WOC Nurse Consult Note: patient with history of squamous cell carcinoma rectum; seen by oncology NP 10/20/2023 with noted desquamation of skin to perineum/groin from radiation  Reason for Consult: radiation burns vagina/rectal area  Wound type:  partial and full thickness skin loss r/t radiation as above  Pressure Injury POA: NA  Measurement: widespread vagina, perineum, rectum, buttocks  Wound bed: red moist  Drainage (amount, consistency, odor)  Periwound: continued sloughing skin  Dressing procedure/placement/frequency: Cleanse vagina/perineum/rectal area with NS, apply Vaseline gauze ([employer] #[id number]) daily and as needed.  May cover with ABD pad and secure with mesh panties if patient can tolerate.    POC discussed with bedside nurse. WOC team will not follow.  Re-consult if further needs arise.   Thank you,    Powell Bar MSN, RN-BC, [employer]

## 2023-10-26 NOTE — Progress Notes (Signed)
   10/26/23 0638  Vitals  Temp (!) 101.4 F (38.6 C)  Temp Source Oral  BP (!) 81/42  MAP (mmHg) (!) 55  BP Location Right Arm  BP Method Automatic  Patient Position (if appropriate) Lying  Pulse Rate 99  Resp (!) 22  MEWS COLOR  MEWS Score Color Yellow  Oxygen Therapy  SpO2 95 %  O2 Device Nasal Cannula  MEWS Score  MEWS Temp 1  MEWS Systolic 1  MEWS Pulse 0  MEWS RR 1  MEWS LOC 0  MEWS Score 3

## 2023-10-26 NOTE — Progress Notes (Signed)
 PROGRESS NOTE    Misty [name]  FMW:[id number] DOB: 03-09-1956 DOA: 10/22/2023 PCP: Dorcus [name], MD    Brief Narrative:   Misty [name] is a 68 y.o. 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  History obtained from patient as well as friend who is her HCPOA and at bedside at time of admission.  Overall patient having a rough time with her cancer treatments.  Over the last week has been having increasing rectal pain, stool incontinence.  Few days prior on 7/25, radiation oncology, Dr. Dewey increased her Dilaudid  from 2 mg to 4 mg every 4-6 hours as needed.  Additionally patient was seen urgently in the urology office due to urinary retention and had Foley catheter placed.  Her friend reports that since increasing the pain medication, patient has been a little more loopy that was intermittent with slow responses.  Patient's friend heard her in the bathroom making noise and went to check on her and was found to be sweaty, confused and incoherent.  Reports fever up to 101.0 at home.  Patient denies cough, no diarrhea, no chest pain, no shortness of breath.  In the ED, temperature 99.4 F, HR 99, RR 18, BP 107/73, SpO2 93% on room air.  WBC 1.9, hemoglobin 7.7, platelet count 139.  Sodium 131, potassium 3.3, chloride 102, CO2 22, glucose 113, BUN 21, creat 1.09.  AST 17, ALT 17, total bilirubin 0.7.  Lactic acid 0.8.  INR 1.1.  Urinalysis with trace leukocytes, negative nitrite, rare bacteria, 0-5 WBCs.  CT head without contrast with no acute intracranial abnormality, mild cerebral white matter changes consistent with chronic small vessel disease.  CT angiogram  chest with findings of subtle small subsegmental embolus left lingula pulmonary artery and anterior branch of right middle lobe pulmonary artery, no evidence of right heart strain, mild cardiomegaly, mild central lobar emphysematous disease, aortic atherosclerosis.  Urine culture and blood cultures x 2 obtained.  Patient received IV Dilaudid  in the ED and following, patient noted to become hypoxic and placed on 4 L nasal cannula.  TRH consulted for admission for further evaluation management of acute metabolic encephalopathy, acute pulmonary embolism, weakness, debility, uncontrolled pain of malignancy.  Assessment & Plan:   Acute metabolic encephalopathy: Resolved Patient presents with confusion, currently undergoing chemoradiation with recent increase of her pain regimen of Dilaudid  from 2 mg to 4 mg every 4-6 hours.  Friend noted that confusion started thereafter.  Patient also with noted fever, also positive for pulmonary embolism on CT imaging.  CT head without contrast with no acute intracranial abnormality.  Urinalysis unrevealing, no focal consolidation noted on CT chest imaging suggestive of a pneumonia.  Dilaudid  has been discontinued and patient's mental status appears back to baseline. -- Supportive care -- Treatment as below  Febrile neutropenia No clear source of infection.  CT chest with no concern for pneumonia. -- Tmax 101.4 past 24 hours -- Blood cultures x 2: No growth x 3 days -- Urine culture: No growth -- Start vancomycin , pharmacy consult for dosing/monitoring given persistent fever -- Continue cefepime  -- Repeat imaging with CT chest/abdomen/pelvis for further evaluation given persistent fever -- Monitor fever curve, supportive care, antipyretics  Hypotension -- Start midodrine  5 mg p.o. 3 times  daily  Acute pulmonary embolism Patient presenting with fever.  No infectious etiology elucidated.  CT chest angiogram with findings of subtle small subsegmental embolus left  lingula pulmonary artery and anterior branch of right middle lobe pulmonary artery, no evidence of right heart strain -- Started on Eliquis ; now discontinued due to worsening anemia/thrombocytopenia -- Continue monitor CBC/platelets closely, if continues to trend down and drops below 50 will need to discontinue anticoagulation  Hypokalemia Potassium 3.1, will continue repletion -- Repeat electrolytes in the a.m.  Jehovah's Witness/blood product refusal Anemia of chronic medical disease Pancytopenia Anemia panel with iron 19, TIBC 160, ferritin 387, folate 37.8, B12 718. -- Hgb 7.7>7.3>6.7>5.8 -- Plt 139>106>100>80>60 -- Patient Jehovah's Witness, continues to refuse blood products as of 10/26/2023  -- Received Procrit 8/1 -- IV iron contraindicated in the setting of active infection -- Eliquis  discontinued 8/3 -- CBC daily  Urinary retention Foley catheter placed by urology 2 days prior to admission. -- Continue Foley catheter -- Outpatient follow-up with urology  Squamous cell carcinoma anus -- Medical oncology following, appreciate assistance -- Currently on chemoradiation  Pain of malignancy Patient recently had her home regimen of Dilaudid  increased from 2 mg to 4 mg p.o. 4-6 hours as needed.  Family has noted that after this increase, her confusion worsened. -- Discontinue Dilaudid  -- Start oxycodone  5-10 mg p.o. every 4 hours as needed moderate/severe pain -- Gabapentin  reduced to 300 mg p.o. twice daily -- Continue home baclofen  10 mg p.o. 3 times daily as needed muscle spasms  Hypothyroidism -- Levothyroxine  25 mcg p.o. daily  Hyperlipidemia -- Atorvastatin  20 mg p.o. daily  Anxiety/depression -- Bupropion  3 mL p.o. every morning -- Valium  5 mg p.o. twice daily as needed anxiety  COPD -- Breztri  2 puffs twice daily -- Singulair  10 mg p.o. nightly -- Albuterol  neb every 2 hours.  Wheezing/shortness of breath  Mucositis -- Magic mouthwash  Insomnia --  Trazodone  25 mg p.o. nightly as needed sleep  Weakness/ability/deconditioning/gait disturbance: -- PT/OT recommend SNF placement -- TOC consulted for placement   DVT prophylaxis:     Code Status: Full Code Family Communication: No family present at bedside this morning  Disposition Plan:  Level of care: Stepdown Status is: Inpatient Remains inpatient appropriate because: IV antibiotics    Consultants:  Medical oncology, Dr. Lanny  Procedures:  none  Antimicrobials:  None   Subjective: Patient seen examined bedside, lying in bed.  Hemoglobin continues to trend down, 5.8 this morning.  Continues to decline blood product transfusion due to her Jehovah's Witness status.  Also with mild hypotension, will start midodrine .  Also continues with persistent fever despite being on cefepime , will add vancomycin .  Also will reimage with CT chest/abdomen/pelvis for further evaluation.  Transferring to stepdown unit for higher level of care.  Other than pain to her rectal region, patient with no specific complaints at this time. Denies headache, no visual changes, no chest pain, no palpitations, no shortness of breath, no abdominal pain, no current fever, no nausea/vomiting/diarrhea, no focal weakness, no fatigue, no paresthesias.  No acute events overnight per nursing staff.  Objective: Vitals:   10/26/23 1143 10/26/23 1145 10/26/23 1200 10/26/23 1203  BP:  (!) 79/30 (!) 82/16 (!) 85/39  Pulse:  87 88 86  Resp:  (!) 21 (!) 24 (!) 30  Temp: 98.3 F (36.8 C)     TempSrc: Oral     SpO2:  95% 98% 94%  Weight:      Height:  Intake/Output Summary (Last 24 hours) at 10/26/2023 1225 Last data filed at 10/26/2023 0946 Gross per 24 hour  Intake 240 ml  Output 2850 ml  Net -2610 ml   Filed Weights   10/22/23 1629  Weight: 72 kg    Examination:  Physical Exam: GEN: NAD, alert and oriented x 3, elderly/ill in appearance HEENT: NCAT, PERRL, EOMI, sclera clear, MMM PULM: CTAB w/o  wheezes/crackles, normal respiratory effort, on 2L [place] with SpO2 94% at rest CV: RRR w/o M/G/R GI: abd soft, NTND, + BS MSK: no peripheral edema, moves all extremities independently NEURO: No focal neurological deficit PSYCH: normal mood/affect Integumentary: No concerning rashes/lesions/wounds none exposed skin surfaces    Data Reviewed: I have personally reviewed following labs and imaging studies  CBC: Recent Labs  Lab 10/20/23 0941 10/22/23 0405 10/23/23 0636 10/24/23 0556 10/25/23 0713 10/26/23 0647  WBC 4.1 1.9* 0.9* 0.5* 0.5* 0.3*  NEUTROABS 2.7 1.3*  --   --   --   --   HGB 8.8* 7.7* 7.3* 6.7* 6.7* 5.8*  HCT 26.9* 24.1* 23.5* 21.9* 21.8* 18.5*  MCV 96.1 98.4 99.6 100.0 100.9* 97.9  PLT 172 139* 106* 100* 80* 66*   Basic Metabolic Panel: Recent Labs  Lab 10/22/23 0405 10/23/23 0636 10/24/23 0556 10/25/23 0713 10/26/23 0647  NA 131* 134* 131* 129* 131*  K 3.3* 3.1* 3.5 4.3 3.1*  CL 102 106 105 107 103  CO2 22 20* 19* 18* 19*  GLUCOSE 113* 116* 135* 98 124*  BUN 21 9 10 11 10   CREATININE 1.09* 1.10* 0.98 0.91 0.84  CALCIUM  8.5* 7.6* 7.8* 7.7* 8.0*  MG  --  2.2 2.0  --   --    GFR: Estimated Creatinine Clearance: 61.8 mL/min (by C-G formula based on SCr of 0.84 mg/dL). Liver Function Tests: Recent Labs  Lab 10/20/23 0941 10/22/23 0405  AST 11* 17  ALT 15 17  ALKPHOS 70 61  BILITOT 0.4 0.7  PROT 6.5 5.8*  ALBUMIN 3.6 3.0*   No results for input(s): LIPASE, AMYLASE in the last 168 hours. No results for input(s): AMMONIA in the last 168 hours. Coagulation Profile: Recent Labs  Lab 10/22/23 0405 10/24/23 1105  INR 1.1 1.9*   Cardiac Enzymes: No results for input(s): CKTOTAL, CKMB, CKMBINDEX, TROPONINI in the last 168 hours. BNP (last 3 results) No results for input(s): PROBNP in the last 8760 hours. HbA1C: No results for input(s): HGBA1C in the last 72 hours. CBG: No results for input(s): GLUCAP in the last 168  hours. Lipid Profile: No results for input(s): CHOL, HDL, LDLCALC, TRIG, CHOLHDL, LDLDIRECT in the last 72 hours. Thyroid Function Tests: No results for input(s): TSH, T4TOTAL, FREET4, T3FREE, THYROIDAB in the last 72 hours. Anemia Panel: Recent Labs    10/24/23 0556  VITAMINB12 718  FOLATE 37.8  FERRITIN 387*  TIBC 168*  IRON 19*  RETICCTPCT 1.4   Sepsis Labs: Recent Labs  Lab 10/22/23 0416  LATICACIDVEN 0.8    Recent Results (from the past 240 hours)  Blood Culture (routine x 2)     Status: None (Preliminary result)   Collection Time: 10/22/23  4:05 AM   Specimen: BLOOD  Result Value Ref Range Status   Specimen Description   Final    BLOOD RIGHT ANTECUBITAL Performed at [facility], [address], [place], [place] [address]    Special Requests   Final    BOTTLES DRAWN AEROBIC AND ANAEROBIC Blood Culture results may not be optimal  due to an inadequate volume of blood received in culture bottles Performed at [facility], [address], [place], [place] [address]    Culture   Final    NO GROWTH 3 DAYS Performed at [facility], [address] [place], [place] [address]    Report Status PENDING  Incomplete  Blood Culture (routine x 2)     Status: None (Preliminary result)   Collection Time: 10/22/23  5:25 AM   Specimen: BLOOD LEFT WRIST  Result Value Ref Range Status   Specimen Description   Final    BLOOD LEFT WRIST Performed at [facility], [address], [place], [place] [address]    Special Requests   Final    BOTTLES DRAWN AEROBIC AND ANAEROBIC Blood Culture results may not be optimal due to an inadequate volume of blood received in culture bottles Performed at [facility], [address] [place], [place] [address]    Culture   Final    NO GROWTH 3 DAYS Performed at [facility], [address] [place], [place] [address]    Report Status PENDING   Incomplete  Urine Culture (for pregnant, neutropenic or urologic patients or patients with an indwelling urinary catheter)     Status: None   Collection Time: 10/23/23  7:20 AM   Specimen: Urine, Catheterized  Result Value Ref Range Status   Specimen Description   Final    URINE, CATHETERIZED Performed at [facility], [address] [place], [place] [address]    Special Requests   Final    NONE Performed at [facility], [address], [place], [place] [address]    Culture   Final    NO GROWTH Performed at [facility], [address] [place], [place] [address]    Report Status 10/24/2023 FINAL  Final         Radiology Studies: No results found.       Scheduled Meds:  atorvastatin   20 mg Oral Daily   budesonide -glycopyrrolate -formoterol   2 puff Inhalation BID   buPROPion   450 mg Oral q morning   Chlorhexidine  Gluconate Cloth  6 each Topical Daily   gabapentin   300 mg Oral BID   lamoTRIgine   200 mg Oral QHS   levothyroxine   75 mcg Oral Q0600   liver oil-zinc  oxide   Topical BID   loperamide   2 mg Oral Once   loratadine   10 mg Oral Daily   midodrine   5 mg Oral TID WC   montelukast   10 mg Oral QHS   mouth rinse  15 mL Mouth Rinse 4 times per day   prenatal multivitamin  1 tablet Oral Q1200   valACYclovir   500 mg Oral Daily   Continuous Infusions:  ceFEPime  (MAXIPIME ) IV     [START ON 10/27/2023] vancomycin        LOS: 2 days    Time spent: 52 minutes spent on 10/26/2023 caring for this patient face-to-face including chart review, ordering labs/tests, documenting, discussion with nursing staff, consultants, updating family and interview/physical exam    Camellia PARAS [place], DO [facility] Available via Epic secure chat 7am-7pm After these hours, please refer to coverage provider listed on [email or web address] 10/26/2023, 12:25 PM

## 2023-10-26 NOTE — Plan of Care (Signed)

## 2023-10-27 ENCOUNTER — Ambulatory Visit: Payer: Medicare (Managed Care)

## 2023-10-27 DIAGNOSIS — G9341 Metabolic encephalopathy: Secondary | ICD-10-CM | POA: Diagnosis not present

## 2023-10-27 LAB — BASIC METABOLIC PANEL WITH GFR
Anion gap: 9 (ref 5–15)
BUN: 11 mg/dL (ref 8–23)
CO2: 19 mmol/L — ABNORMAL LOW (ref 22–32)
Calcium: 8.2 mg/dL — ABNORMAL LOW (ref 8.9–10.3)
Chloride: 108 mmol/L (ref 98–111)
Creatinine, Ser: 0.88 mg/dL (ref 0.44–1.00)
GFR, Estimated: >60 mL/min (ref >60)
Glucose, Bld: 102 mg/dL — ABNORMAL HIGH (ref 70–99)
Potassium: 4 mmol/L (ref 3.5–5.1)
Sodium: 136 mmol/L (ref 135–145)

## 2023-10-27 LAB — CULTURE, BLOOD (ROUTINE X 2)
Culture: NO GROWTH
Culture: NO GROWTH

## 2023-10-27 LAB — CBC
HCT: 17.5 % — ABNORMAL LOW (ref 36.0–46.0)
Hemoglobin: 5.4 g/dL — CL (ref 12.0–15.0)
MCH: 30.2 pg (ref 26.0–34.0)
MCHC: 30.9 g/dL (ref 30.0–36.0)
MCV: 97.8 fL (ref 80.0–100.0)
Platelets: 57 K/uL — ABNORMAL LOW (ref 150–400)
RBC: 1.79 MIL/uL — ABNORMAL LOW (ref 3.87–5.11)
RDW: 17.6 % — ABNORMAL HIGH (ref 11.5–15.5)
WBC: 1.2 K/uL — CL (ref 4.0–10.5)
nRBC: 0 % (ref 0.0–0.2)

## 2023-10-27 LAB — HEPATIC FUNCTION PANEL
ALT: 40 U/L (ref 0–44)
AST: 51 U/L — ABNORMAL HIGH (ref 15–41)
Albumin: 1.7 g/dL — ABNORMAL LOW (ref 3.5–5.0)
Alkaline Phosphatase: 50 U/L (ref 38–126)
Bilirubin, Direct: <0.1 mg/dL (ref 0.0–0.2)
Total Bilirubin: 0.3 mg/dL (ref 0.0–1.2)
Total Protein: 4.7 g/dL — ABNORMAL LOW (ref 6.5–8.1)

## 2023-10-27 LAB — PROCALCITONIN: Procalcitonin: 3.89 ng/mL

## 2023-10-27 MED ORDER — POLYETHYLENE GLYCOL 3350 17 G PO PACK: 17.0000 g | PACK | Freq: Every day | ORAL | Status: DC | PRN

## 2023-10-27 MED ORDER — MIDODRINE HCL 5 MG PO TABS
10.0000 mg | ORAL_TABLET | Freq: Three times a day (TID) | ORAL | Status: DC
  Administered 2023-10-27 – 2023-11-03 (×24): 10 mg via ORAL
  Filled 2023-10-27 (×22): qty 2

## 2023-10-27 MED ORDER — POLYETHYLENE GLYCOL 3350 17 G PO PACK
17.0000 g | PACK | Freq: Every day | ORAL | Status: DC
  Administered 2023-10-27: 17 g via ORAL
  Filled 2023-10-27: qty 1

## 2023-10-27 MED ORDER — PROCHLORPERAZINE EDISYLATE 10 MG/2ML IJ SOLN
INTRAMUSCULAR | Status: AC
  Filled 2023-10-27: qty 2

## 2023-10-27 MED ORDER — ORAL CARE MOUTH RINSE: 15.0000 mL | OROMUCOSAL | Status: DC | PRN

## 2023-10-27 MED ORDER — PROCHLORPERAZINE EDISYLATE 10 MG/2ML IJ SOLN
10.0000 mg | Freq: Four times a day (QID) | INTRAMUSCULAR | Status: DC | PRN
  Administered 2023-10-27: 10 mg via INTRAVENOUS

## 2023-10-27 MED ORDER — SENNOSIDES-DOCUSATE SODIUM 8.6-50 MG PO TABS: 1.0000 | ORAL_TABLET | Freq: Two times a day (BID) | ORAL | Status: DC

## 2023-10-27 MED ORDER — PROMETHAZINE (PHENERGAN) 6.25MG IN NS 50ML IVPB
6.2500 mg | Freq: Four times a day (QID) | INTRAVENOUS | Status: DC | PRN
  Administered 2023-10-28 – 2023-10-31 (×2): 6.25 mg via INTRAVENOUS
  Filled 2023-10-27 (×2): qty 6.25

## 2023-10-27 MED ORDER — ORAL CARE MOUTH RINSE
15.0000 mL | OROMUCOSAL | Status: DC | PRN
Start: 2023-10-27 — End: 2023-11-03

## 2023-10-27 NOTE — Progress Notes (Addendum)
 PROGRESS NOTE    Misty [name]  FMW:[id number] DOB: Sep 15, 1955 DOA: 10/22/2023 PCP: Dorcus [name], MD    Brief Narrative:   Misty [name] is a 68 y.o. 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  History obtained from patient as well as friend who is her HCPOA and at bedside at time of admission.  Overall patient having a rough time with her cancer treatments.  Over the last week has been having increasing rectal pain, stool incontinence.  Few days prior on 7/25, radiation oncology, Dr. Dewey increased her Dilaudid  from 2 mg to 4 mg every 4-6 hours as needed.  Additionally patient was seen urgently in the urology office due to urinary retention and had Foley catheter placed.  Her friend reports that since increasing the pain medication, patient has been a little more loopy that was intermittent with slow responses.  Patient's friend heard her in the bathroom making noise and went to check on her and was found to be sweaty, confused and incoherent.  Reports fever up to 101.0 at home.  Patient denies cough, no diarrhea, no chest pain, no shortness of breath.  In the ED, temperature 99.4 F, HR 99, RR 18, BP 107/73, SpO2 93% on room air.  WBC 1.9, hemoglobin 7.7, platelet count 139.  Sodium 131, potassium 3.3, chloride 102, CO2 22, glucose 113, BUN 21, creat 1.09.  AST 17, ALT 17, total bilirubin 0.7.  Lactic acid 0.8.  INR 1.1.  Urinalysis with trace leukocytes, negative nitrite, rare bacteria, 0-5 WBCs.  CT head without contrast with no acute intracranial abnormality, mild cerebral white matter changes consistent with chronic small vessel disease.  CT angiogram  chest with findings of subtle small subsegmental embolus left lingula pulmonary artery and anterior branch of right middle lobe pulmonary artery, no evidence of right heart strain, mild cardiomegaly, mild central lobar emphysematous disease, aortic atherosclerosis.  Urine culture and blood cultures x 2 obtained.  Patient received IV Dilaudid  in the ED and following, patient noted to become hypoxic and placed on 4 L nasal cannula.  TRH consulted for admission for further evaluation management of acute metabolic encephalopathy, acute pulmonary embolism, weakness, debility, uncontrolled pain of malignancy.  Assessment & Plan:   Acute metabolic encephalopathy: Resolved Patient presents with confusion, currently undergoing chemoradiation with recent increase of her pain regimen of Dilaudid  from 2 mg to 4 mg every 4-6 hours.  Friend noted that confusion started thereafter.  Patient also with noted fever, also positive for pulmonary embolism on CT imaging.  CT head without contrast with no acute intracranial abnormality.  Urinalysis unrevealing, no focal consolidation noted on CT chest imaging suggestive of a pneumonia.  Dilaudid  has been discontinued and patient's mental status appears back to baseline. -- Supportive care -- Treatment as below  Febrile neutropenia Pneumonia Severe sepsis, not POA No clear source of infection.  CT chest 7/30 with no concern for pneumonia.  Repeat imaging with CT chest/abdomen/pelvis with contrast 8/3 with findings of patchy airspace disease and consolidation right upper/right lower and left lower lobes consistent with pneumonia, diffuse bladder wall thickening, rectal wall thickening and surrounding fat stranding.  -- Tmax 99.9 past 24 hours -- Blood cultures x 2 7/30: No growth x 5 days --  Blood cultures x 2 8/3: No growth <12h -- Urine culture 7/31: No growth -- Vancomycin , pharmacy consulted for dosing/monitoring -- Continue cefepime ; if platelet count continues to  worsen may need to consider changing to meropenem -- Monitor fever curve, supportive care, antipyretics -- Incentive spirometry, flutter valve  Hypotension -- Increase midodrine  to 10 mg p.o. 3 times daily  Acute pulmonary embolism Patient presenting with fever.  No infectious etiology elucidated.  CT chest angiogram with findings of subtle small subsegmental embolus left lingula pulmonary artery and anterior branch of right middle lobe pulmonary artery, no evidence of right heart strain -- Started on Eliquis ; now discontinued due to worsening anemia/thrombocytopenia on 8/3 -- Continue monitor CBC/platelets closely, if continues to trend down and drops below 50 will need to discontinue anticoagulation  Hypokalemia Repleted.  Potassium 4.0 this morning -- Repeat electrolytes in the a.m.  Jehovah's Witness/blood product refusal Anemia of chronic medical disease Pancytopenia Anemia panel with iron 19, TIBC 160, ferritin 387, folate 37.8, B12 718. -- Hgb 7.7>7.3>6.7>5.8>5.4 -- Plt 139>106>100>80>60 -- Patient Jehovah's Witness, continues to refuse blood products as of 10/27/2023  -- Received Procrit 8/1 --Received Retacrit  and Granix  on 8/3 -- IV iron contraindicated in the setting of active infection -- Eliquis  discontinued 8/3 -- CBC daily  Urinary retention Foley catheter placed by urology 2 days prior to admission.  CT abdomen/pelvis with diffuse bladder wall thickening, likely consistent with chronic bladder outlet obstruction. -- Continue Foley catheter -- Outpatient follow-up with urology  Squamous cell carcinoma anus -- Medical oncology following, appreciate assistance -- Currently on chemoradiation; currently on hold due to illness as above  Pain of malignancy Patient recently had her home regimen of Dilaudid  increased from 2 mg to 4 mg p.o. 4-6 hours as needed.  Family has noted that after this increase, her confusion worsened. -- Discontinued Dilaudid   -- Start oxycodone   5-10 mg p.o. every 4 hours as needed moderate/severe pain -- Gabapentin  reduced to 300 mg p.o. twice daily -- Continue home baclofen  10 mg p.o. 3 times daily as needed muscle spasms  Hypothyroidism -- Levothyroxine  25 mcg p.o. daily  Hyperlipidemia -- Atorvastatin  20 mg p.o. daily  Anxiety/depression -- Bupropion  450 mg p.o. every morning -- Valium  5 mg p.o. twice daily as needed anxiety  COPD -- Breztri  2 puffs twice daily -- Singulair  10 mg p.o. nightly -- Albuterol  neb every 2 hours.  Wheezing/shortness of breath  Mucositis -- Magic mouthwash  Insomnia -- Trazodone  25 mg p.o. nightly as needed sleep  Constipation -- Senokot-as 1 tablet p.o. twice daily -- MiraLAX  daily -- Avoid rectal suppositories given rectal pain with history of anal cancer -- Strict I's and O's, monitor bowel movements closely  Weakness/ability/deconditioning/gait disturbance: -- PT/OT recommend SNF placement -- TOC consulted for placement   DVT prophylaxis: Chemical DVT prophylaxis contraindicated in severe anemia/thrombocytopenia    Code Status: Full Code Family Communication: No family present at bedside this morning  Disposition Plan:  Level of care: Stepdown Status is: Inpatient Remains inpatient appropriate because: IV antibiotics    Consultants:  Medical oncology, Dr. Lanny  Procedures:  none  Antimicrobials:  None   Subjective: Patient seen examined bedside, lying in bed.  Daughter present at bedside and updated.  Discussed with RN.  Hemoglobin continues to trend down, 5.4 this morning.  Continues to decline blood product transfusion due to her Jehovah's Witness status.  Continues with intermittent hypotension will increase midodrine .  Fever curve improved, Tmax 99.9 past 24 hours.  Remains on  IV vancomycin  and cefepime .  Platelet count also slightly down to 57 from 60 yesterday.  Other than pain to her rectal region and cough, patient with no specific complaints at this time.  Denies headache, no visual changes, no chest pain, no palpitations, no abdominal pain, no current fever, no nausea/vomiting/diarrhea, no focal weakness, no fatigue, no paresthesias.  No acute events overnight per nursing staff.  Objective: Vitals:   10/27/23 0700 10/27/23 0730 10/27/23 0800 10/27/23 0830  BP: (!) 100/37 92/64 (!) 90/38 106/78  Pulse: 81 85 79 79  Resp: 18 20 15 19   Temp:    98.9 F (37.2 C)  TempSrc:    Oral  SpO2: 97% 97% 95% 97%  Weight:      Height:        Intake/Output Summary (Last 24 hours) at 10/27/2023 0954 Last data filed at 10/27/2023 0702 Gross per 24 hour  Intake 818.21 ml  Output 1950 ml  Net -1131.79 ml   Filed Weights   10/22/23 1629 10/26/23 1324  Weight: 72 kg 72.5 kg    Examination:  Physical Exam: GEN: NAD, alert and oriented x 3, elderly/ill in appearance HEENT: NCAT, PERRL, EOMI, sclera clear, MMM PULM: Breath sounds diminished bilateral bases with rhonchi, no wheezing, normal respiratory effort without accessory muscle use, on 3L [place] with SpO2 97% at rest CV: RRR w/o M/G/R GI: abd soft, NTND, + BS MSK: no peripheral edema, moves all extremities independently NEURO: No focal neurological deficit PSYCH: normal mood/affect Integumentary: No concerning rashes/lesions/wounds noted exposed skin surfaces    Data Reviewed: I have personally reviewed following labs and imaging studies  CBC: Recent Labs  Lab 10/22/23 0405 10/23/23 0636 10/24/23 0556 10/25/23 0713 10/26/23 0647 10/27/23 0307  WBC 1.9* 0.9* 0.5* 0.5* 0.3* 1.2*  NEUTROABS 1.3*  --   --   --   --   --   HGB 7.7* 7.3* 6.7* 6.7* 5.8* 5.4*  HCT 24.1* 23.5* 21.9* 21.8* 18.5* 17.5*  MCV 98.4 99.6 100.0 100.9* 97.9 97.8  PLT 139* 106* 100* 80* 66* 57*   Basic Metabolic Panel: Recent Labs  Lab 10/23/23 0636 10/24/23 0556 10/25/23 0713 10/26/23 0647 10/27/23 0307  NA 134* 131* 129* 131* 136  K 3.1* 3.5 4.3 3.1* 4.0  CL 106 105 107 103 108  CO2 20* 19* 18* 19* 19*   GLUCOSE 116* 135* 98 124* 102*  BUN 9 10 11 10 11   CREATININE 1.10* 0.98 0.91 0.84 0.88  CALCIUM  7.6* 7.8* 7.7* 8.0* 8.2*  MG 2.2 2.0  --   --   --    GFR: Estimated Creatinine Clearance: 59.2 mL/min (by C-G formula based on SCr of 0.88 mg/dL). Liver Function Tests: Recent Labs  Lab 10/22/23 0405 10/27/23 0307  AST 17 51*  ALT 17 40  ALKPHOS 61 50  BILITOT 0.7 0.3  PROT 5.8* 4.7*  ALBUMIN 3.0* 1.7*   No results for input(s): LIPASE, AMYLASE in the last 168 hours. No results for input(s): AMMONIA in the last 168 hours. Coagulation Profile: Recent Labs  Lab 10/22/23 0405 10/24/23 1105  INR 1.1 1.9*   Cardiac Enzymes: No results for input(s): CKTOTAL, CKMB, CKMBINDEX, TROPONINI in the last 168 hours. BNP (last 3 results) No results for input(s): PROBNP in the last 8760 hours. HbA1C: No results for input(s): HGBA1C in the last 72 hours. CBG: No results for input(s): GLUCAP in the last 168 hours. Lipid Profile: No results for input(s): CHOL, HDL, LDLCALC, TRIG, CHOLHDL, LDLDIRECT in the  last 72 hours. Thyroid Function Tests: No results for input(s): TSH, T4TOTAL, FREET4, T3FREE, THYROIDAB in the last 72 hours. Anemia Panel: No results for input(s): VITAMINB12, FOLATE, FERRITIN, TIBC, IRON, RETICCTPCT in the last 72 hours.  Sepsis Labs: Recent Labs  Lab 10/22/23 0416 10/27/23 0307  PROCALCITON  --  3.89  LATICACIDVEN 0.8  --     Recent Results (from the past 240 hours)  Blood Culture (routine x 2)     Status: None   Collection Time: 10/22/23  4:05 AM   Specimen: BLOOD  Result Value Ref Range Status   Specimen Description   Final    BLOOD RIGHT ANTECUBITAL Performed at [facility], [address] [place], [place] [address]    Special Requests   Final    BOTTLES DRAWN AEROBIC AND ANAEROBIC Blood Culture results may not be optimal due to an inadequate volume of blood received in culture  bottles Performed at [facility], [address], [place], [place] [address]    Culture   Final    NO GROWTH 5 DAYS Performed at [facility], [address], [place], [place] [address]    Report Status 10/27/2023 FINAL  Final  Blood Culture (routine x 2)     Status: None   Collection Time: 10/22/23  5:25 AM   Specimen: BLOOD LEFT WRIST  Result Value Ref Range Status   Specimen Description   Final    BLOOD LEFT WRIST Performed at [facility], [address] [place], [place] [address]    Special Requests   Final    BOTTLES DRAWN AEROBIC AND ANAEROBIC Blood Culture results may not be optimal due to an inadequate volume of blood received in culture bottles Performed at [facility], [address], [place], [place] [address]    Culture   Final    NO GROWTH 5 DAYS Performed at [facility], [address], [place], [place] [address]    Report Status 10/27/2023 FINAL  Final  Urine Culture (for pregnant, neutropenic or urologic patients or patients with an indwelling urinary catheter)     Status: None   Collection Time: 10/23/23  7:20 AM   Specimen: Urine, Catheterized  Result Value Ref Range Status   Specimen Description   Final    URINE, CATHETERIZED Performed at [facility], [address] [place], [place] [address]    Special Requests   Final    NONE Performed at [facility], [address] [place], [place] [address]    Culture   Final    NO GROWTH Performed at [facility], [address] [place], [place] [address]    Report Status 10/24/2023 FINAL  Final  MRSA Next Gen by PCR, Nasal     Status: None   Collection Time: 10/26/23 11:32 AM   Specimen: Nasal Mucosa; Nasal Swab  Result Value Ref Range Status   MRSA by PCR Next Gen NOT DETECTED NOT DETECTED Final    Comment: (NOTE) The GeneXpert MRSA Assay (FDA approved for NASAL specimens only), is one component of a  comprehensive MRSA colonization surveillance program. It is not intended to diagnose MRSA infection nor to guide or monitor treatment for MRSA infections. Test performance is not FDA approved in patients less than 61 years old. Performed at [facility], [address] [place], [place] [address]   Culture, blood (Routine X 2) w Reflex to  ID Panel     Status: None (Preliminary result)   Collection Time: 10/26/23  3:33 PM   Specimen: BLOOD RIGHT HAND  Result Value Ref Range Status   Specimen Description   Final    BLOOD RIGHT HAND Performed at [facility], [address] [place], [place] [address]    Special Requests   Final    BOTTLES DRAWN AEROBIC AND ANAEROBIC Blood Culture results may not be optimal due to an inadequate volume of blood received in culture bottles Performed at [facility], [address] [place], [place] [address]    Culture   Final    NO GROWTH < 12 HOURS Performed at [facility], [address] [place], [place] [address]    Report Status PENDING  Incomplete  Culture, blood (Routine X 2) w Reflex to ID Panel     Status: None (Preliminary result)   Collection Time: 10/26/23  3:33 PM   Specimen: BLOOD RIGHT HAND  Result Value Ref Range Status   Specimen Description   Final    BLOOD RIGHT HAND Performed at [facility], [address], [place], [place] [address]    Special Requests   Final    BOTTLES DRAWN AEROBIC AND ANAEROBIC Blood Culture results may not be optimal due to an inadequate volume of blood received in culture bottles Performed at [facility], [address], [place], [place] [address]    Culture   Final    NO GROWTH < 12 HOURS Performed at [facility], [address] [place], [place] [address]    Report Status PENDING  Incomplete         Radiology Studies: CT CHEST ABDOMEN PELVIS W CONTRAST Result Date: 10/26/2023 CLINICAL DATA:  Sepsis. EXAM: CT  CHEST, ABDOMEN, AND PELVIS WITH CONTRAST TECHNIQUE: Multidetector CT imaging of the chest, abdomen and pelvis was performed following the standard protocol during bolus administration of intravenous contrast. RADIATION DOSE REDUCTION: This exam was performed according to the departmental dose-optimization program which includes automated exposure control, adjustment of the mA and/or kV according to patient size and/or use of iterative reconstruction technique. CONTRAST:  80mL OMNIPAQUE  IOHEXOL  300 MG/ML  SOLN COMPARISON:  10/22/2023. FINDINGS: CT CHEST FINDINGS Cardiovascular: The heart is normal in size and there is a small pericardial effusion. There is atherosclerotic calcification of the aorta without evidence of aneurysm. The pulmonary trunk is normal in caliber. Mediastinum/Nodes: No mediastinal, hilar, or axillary lymphadenopathy is seen. The thyroid gland and trachea are within normal limits. The esophagus is mildly distended with fluid. Lungs/Pleura: Paraseptal and centrilobular emphysematous changes are noted in the lungs. Scattered airspace disease and consolidation are present in the lungs bilaterally, involving right lower lobe, left lower lobe, and right upper lobe. No effusion or pneumothorax is seen. Musculoskeletal: Degenerative changes are present in the thoracic spine. No acute osseous abnormality is seen. CT ABDOMEN PELVIS FINDINGS Hepatobiliary: No focal liver abnormality is seen. No gallstones, gallbladder wall thickening, or biliary dilatation. Pancreas: Unremarkable. No pancreatic ductal dilatation or surrounding inflammatory changes. Spleen: Normal in size without focal abnormality. Adrenals/Urinary Tract: The adrenal glands are within normal limits. The kidneys enhance symmetrically. Subcentimeter hypodensities are present in the kidneys bilaterally which are too small to further characterize. No renal calculus or hydronephrosis is seen. Diffuse bladder wall thickening is noted.  Stomach/Bowel: The stomach is within normal limits. No bowel obstruction, free air, or pneumatosis is seen. The appendix is surgically  absent. There is bowel wall thickening involving the rectum with surrounding inflammatory changes. Vascular/Lymphatic: Aortic atherosclerosis. No enlarged abdominal or pelvic lymph nodes. Reproductive: Status post hysterectomy. No adnexal masses. Other: There is a trace amount of perisplenic free fluid. Edema and a small amount of free fluid are present in the presacral space. Musculoskeletal: Degenerative changes are present in the lumbar spine. No acute osseous abnormality is seen. IMPRESSION: 1. Patchy airspace disease and consolidation in right upper, right lower, and left lower lobes, concerning for pneumonia. 2. Diffuse bladder wall thickening suggesting infectious or inflammatory cystitis. 3. Rectal wall thickening with surrounding fat stranding and edema, compatible with proctitis. 4. Emphysema. 5. Aortic atherosclerosis and coronary artery calcifications. Electronically Signed   By: Leita Birmingham M.D.   On: 10/26/2023 14:17         Scheduled Meds:  atorvastatin   20 mg Oral Daily   budesonide -glycopyrrolate -formoterol   2 puff Inhalation BID   buPROPion   450 mg Oral q morning   Chlorhexidine  Gluconate Cloth  6 each Topical Daily   filgrastim  (NIVESTYM ) SQ  480 mcg Subcutaneous q1800   gabapentin   300 mg Oral BID   lamoTRIgine   200 mg Oral QHS   levothyroxine   75 mcg Oral Q0600   liver oil-zinc  oxide   Topical BID   loperamide   2 mg Oral Once   loratadine   10 mg Oral Daily   midodrine   10 mg Oral TID WC   montelukast   10 mg Oral QHS   mouth rinse  15 mL Mouth Rinse 4 times per day   polyethylene glycol  17 g Oral Daily   prenatal multivitamin  1 tablet Oral Q1200   senna-docusate  1 tablet Oral BID   valACYclovir   500 mg Oral Daily   Continuous Infusions:  ceFEPime  (MAXIPIME ) IV Stopped (10/27/23 0700)   vancomycin  1,250 mg (10/27/23 0811)      LOS: 3 days    Time spent: 52 minutes spent on 10/27/2023 caring for this patient face-to-face including chart review, ordering labs/tests, documenting, discussion with nursing staff, consultants, updating family and interview/physical exam    Camellia PARAS [place], DO [facility] Available via Epic secure chat 7am-7pm After these hours, please refer to coverage provider listed on [email or web address] 10/27/2023, 9:54 AM

## 2023-10-27 NOTE — Plan of Care (Signed)
  Problem: Clinical Measurements: Goal: Respiratory complications will improve Outcome: Progressing Goal: Cardiovascular complication will be avoided Outcome: Progressing   Problem: Elimination: Goal: Will not experience complications related to urinary retention Outcome: Progressing   Problem: Pain Managment: Goal: General experience of comfort will improve and/or be controlled Outcome: Progressing   Problem: Safety: Goal: Ability to remain free from injury will improve Outcome: Progressing

## 2023-10-27 NOTE — Progress Notes (Signed)
 PT Cancellation Note  Patient Details Name: Misty [name] MRN: [id number] DOB: 1955/08/09   Cancelled Treatment:    Reason Eval/Treat Not Completed: Medical issues which prohibited therapy HGB trending down, septic shock, moved to ICU. Will follow  Darice Potters PT Acute Rehabilitation Services Office [phone]   Potters Darice Norris 10/27/2023, 8:02 AM

## 2023-10-27 NOTE — Progress Notes (Addendum)
 Misty [name]   DOB:[birth year]   FM#:[id number]   RDW#:[id number]  Oncology follow-up note  Subjective: Patient is hemodynamically stable, has not had a bowel movement for 4 days.  She is quite fatigued, no more fever or chills.  Objective:  Vitals:   10/27/23 2000 10/27/23 2200  BP: 132/60 (!) 128/42  Pulse: (!) 101 87  Resp: (!) 26 20  Temp:    SpO2: 97% 99%    Body mass index is 28.31 kg/m.  Intake/Output Summary (Last 24 hours) at 10/27/2023 2218 Last data filed at 10/27/2023 1900 Gross per 24 hour  Intake 683.57 ml  Output 2000 ml  Net -1316.43 ml     Sclerae unicteric  Oropharynx clear  No peripheral adenopathy  Lungs clear -- no rales or rhonchi  Heart regular rate and rhythm  Abdomen soft but distended, with mild tenderness  MSK no focal spinal tenderness, no peripheral edema  Neuro nonfocal    CBG (last 3)  No results for input(s): GLUCAP in the last 72 hours.   Labs:  Lab Results  Component Value Date   WBC 1.2 (LL) 10/27/2023   HGB 5.4 (LL) 10/27/2023   HCT 17.5 (L) 10/27/2023   MCV 97.8 10/27/2023   PLT 57 (L) 10/27/2023   NEUTROABS 1.3 (L) 10/22/2023     Urine Studies No results for input(s): UHGB, CRYS in the last 72 hours.  Invalid input(s): UACOL, UAPR, USPG, UPH, UTP, UGL, UKET, UBIL, UNIT, UROB, [place], UEPI, UWBC, CORINN JERROL BURNS [place], [place]  Basic Metabolic Panel: Recent Labs  Lab 10/23/23 0636 10/24/23 0556 10/25/23 0713 10/26/23 0647 10/27/23 0307  NA 134* 131* 129* 131* 136  K 3.1* 3.5 4.3 3.1* 4.0  CL 106 105 107 103 108  CO2 20* 19* 18* 19* 19*  GLUCOSE 116* 135* 98 124* 102*  BUN 9 10 11 10 11   CREATININE 1.10* 0.98 0.91 0.84 0.88  CALCIUM  7.6* 7.8* 7.7* 8.0* 8.2*  MG 2.2 2.0  --   --   --    GFR Estimated Creatinine Clearance: 59.2 mL/min (by C-G formula based on SCr of 0.88 mg/dL). Liver Function Tests: Recent Labs  Lab 10/22/23 0405 10/27/23 0307  AST 17 51*  ALT  17 40  ALKPHOS 61 50  BILITOT 0.7 0.3  PROT 5.8* 4.7*  ALBUMIN 3.0* 1.7*   No results for input(s): LIPASE, AMYLASE in the last 168 hours. No results for input(s): AMMONIA in the last 168 hours. Coagulation profile Recent Labs  Lab 10/22/23 0405 10/24/23 1105  INR 1.1 1.9*    CBC: Recent Labs  Lab 10/22/23 0405 10/23/23 0636 10/24/23 0556 10/25/23 0713 10/26/23 0647 10/27/23 0307  WBC 1.9* 0.9* 0.5* 0.5* 0.3* 1.2*  NEUTROABS 1.3*  --   --   --   --   --   HGB 7.7* 7.3* 6.7* 6.7* 5.8* 5.4*  HCT 24.1* 23.5* 21.9* 21.8* 18.5* 17.5*  MCV 98.4 99.6 100.0 100.9* 97.9 97.8  PLT 139* 106* 100* 80* 66* 57*   Cardiac Enzymes: No results for input(s): CKTOTAL, CKMB, CKMBINDEX, TROPONINI in the last 168 hours. BNP: Invalid input(s): POCBNP CBG: No results for input(s): GLUCAP in the last 168 hours. D-Dimer No results for input(s): DDIMER in the last 72 hours. Hgb A1c No results for input(s): HGBA1C in the last 72 hours. Lipid Profile No results for input(s): CHOL, HDL, LDLCALC, TRIG, CHOLHDL, LDLDIRECT in the last 72 hours. Thyroid function studies No results for input(s): TSH, T4TOTAL, T3FREE, THYROIDAB in  the last 72 hours.  Invalid input(s): FREET3 Anemia work up No results for input(s): VITAMINB12, FOLATE, FERRITIN, TIBC, IRON, RETICCTPCT in the last 72 hours.  Microbiology Recent Results (from the past 240 hours)  Blood Culture (routine x 2)     Status: None   Collection Time: 10/22/23  4:05 AM   Specimen: BLOOD  Result Value Ref Range Status   Specimen Description   Final    BLOOD RIGHT ANTECUBITAL Performed at [facility], [address], [place], [place] [address]    Special Requests   Final    BOTTLES DRAWN AEROBIC AND ANAEROBIC Blood Culture results may not be optimal due to an inadequate volume of blood received in culture bottles Performed at [facility], [address], [place], [place] [address]    Culture   Final    NO GROWTH 5 DAYS Performed at [facility], [address], [place], [place] [address]    Report Status 10/27/2023 FINAL  Final  Blood Culture (routine x 2)     Status: None   Collection Time: 10/22/23  5:25 AM   Specimen: BLOOD LEFT WRIST  Result Value Ref Range Status   Specimen Description   Final    BLOOD LEFT WRIST Performed at [facility], [address], [place], [place] [address]    Special Requests   Final    BOTTLES DRAWN AEROBIC AND ANAEROBIC Blood Culture results may not be optimal due to an inadequate volume of blood received in culture bottles Performed at [facility], [address] [place], [place] [address]    Culture   Final    NO GROWTH 5 DAYS Performed at [facility], [address] [place], [place] [address]    Report Status 10/27/2023 FINAL  Final  Urine Culture (for pregnant, neutropenic or urologic patients or patients with an indwelling urinary catheter)     Status: None   Collection Time: 10/23/23  7:20 AM   Specimen: Urine, Catheterized  Result Value Ref Range Status   Specimen Description   Final    URINE, CATHETERIZED Performed at [facility], [address], [place], [place] [address]    Special Requests   Final    NONE Performed at [facility], [address], [place], [place] [address]    Culture   Final    NO GROWTH Performed at [facility], [address], [place], [place] [address]    Report Status 10/24/2023 FINAL  Final  MRSA Next Gen by PCR, Nasal     Status: None   Collection Time: 10/26/23 11:32 AM   Specimen: Nasal Mucosa; Nasal Swab  Result Value Ref Range Status   MRSA by PCR Next Gen NOT DETECTED NOT DETECTED Final    Comment: (NOTE) The GeneXpert MRSA Assay (FDA approved for NASAL specimens only), is one component of a comprehensive MRSA colonization surveillance program. It  is not intended to diagnose MRSA infection nor to guide or monitor treatment for MRSA infections. Test performance is not FDA approved in patients less than 75 years old. Performed at [facility], [address] [place], [place] [address]   Culture, blood (Routine X 2) w Reflex to ID Panel     Status: None (Preliminary result)   Collection Time: 10/26/23  3:33 PM   Specimen: BLOOD RIGHT HAND  Result Value Ref Range Status   Specimen Description  Final    BLOOD RIGHT HAND Performed at [facility], [address], [place], [place] [address]    Special Requests   Final    BOTTLES DRAWN AEROBIC AND ANAEROBIC Blood Culture results may not be optimal due to an inadequate volume of blood received in culture bottles Performed at [facility], [address] [place], [place] [address]    Culture   Final    NO GROWTH < 12 HOURS Performed at [facility], [address] [place], [place] [address]    Report Status PENDING  Incomplete  Culture, blood (Routine X 2) w Reflex to ID Panel     Status: None (Preliminary result)   Collection Time: 10/26/23  3:33 PM   Specimen: BLOOD RIGHT HAND  Result Value Ref Range Status   Specimen Description   Final    BLOOD RIGHT HAND Performed at [facility], [address] [place], [place] [address]    Special Requests   Final    BOTTLES DRAWN AEROBIC AND ANAEROBIC Blood Culture results may not be optimal due to an inadequate volume of blood received in culture bottles Performed at [facility], [address] [place], [place] [address]    Culture   Final    NO GROWTH < 12 HOURS Performed at [facility], [address], [place], [place] [address]    Report Status PENDING  Incomplete      Studies:  CT CHEST ABDOMEN PELVIS W CONTRAST Result Date: 10/26/2023 CLINICAL DATA:  Sepsis. EXAM: CT CHEST, ABDOMEN, AND PELVIS WITH CONTRAST TECHNIQUE: Multidetector CT  imaging of the chest, abdomen and pelvis was performed following the standard protocol during bolus administration of intravenous contrast. RADIATION DOSE REDUCTION: This exam was performed according to the departmental dose-optimization program which includes automated exposure control, adjustment of the mA and/or kV according to patient size and/or use of iterative reconstruction technique. CONTRAST:  80mL OMNIPAQUE  IOHEXOL  300 MG/ML  SOLN COMPARISON:  10/22/2023. FINDINGS: CT CHEST FINDINGS Cardiovascular: The heart is normal in size and there is a small pericardial effusion. There is atherosclerotic calcification of the aorta without evidence of aneurysm. The pulmonary trunk is normal in caliber. Mediastinum/Nodes: No mediastinal, hilar, or axillary lymphadenopathy is seen. The thyroid gland and trachea are within normal limits. The esophagus is mildly distended with fluid. Lungs/Pleura: Paraseptal and centrilobular emphysematous changes are noted in the lungs. Scattered airspace disease and consolidation are present in the lungs bilaterally, involving right lower lobe, left lower lobe, and right upper lobe. No effusion or pneumothorax is seen. Musculoskeletal: Degenerative changes are present in the thoracic spine. No acute osseous abnormality is seen. CT ABDOMEN PELVIS FINDINGS Hepatobiliary: No focal liver abnormality is seen. No gallstones, gallbladder wall thickening, or biliary dilatation. Pancreas: Unremarkable. No pancreatic ductal dilatation or surrounding inflammatory changes. Spleen: Normal in size without focal abnormality. Adrenals/Urinary Tract: The adrenal glands are within normal limits. The kidneys enhance symmetrically. Subcentimeter hypodensities are present in the kidneys bilaterally which are too small to further characterize. No renal calculus or hydronephrosis is seen. Diffuse bladder wall thickening is noted. Stomach/Bowel: The stomach is within normal limits. No bowel obstruction, free  air, or pneumatosis is seen. The appendix is surgically absent. There is bowel wall thickening involving the rectum with surrounding inflammatory changes. Vascular/Lymphatic: Aortic atherosclerosis. No enlarged abdominal or pelvic lymph nodes. Reproductive: Status post hysterectomy. No adnexal masses. Other: There is a trace amount of perisplenic free  fluid. Edema and a small amount of free fluid are present in the presacral space. Musculoskeletal: Degenerative changes are present in the lumbar spine. No acute osseous abnormality is seen. IMPRESSION: 1. Patchy airspace disease and consolidation in right upper, right lower, and left lower lobes, concerning for pneumonia. 2. Diffuse bladder wall thickening suggesting infectious or inflammatory cystitis. 3. Rectal wall thickening with surrounding fat stranding and edema, compatible with proctitis. 4. Emphysema. 5. Aortic atherosclerosis and coronary artery calcifications. Electronically Signed   By: Leita Birmingham M.D.   On: 10/26/2023 14:17    Assessment: 68 y.o. female   Neutropenic fever and septic shock, improved  Multifocal pneumonia Acute pulmonary embolism Worsening anemia secondary to chemotherapy, patient is a Jehovah's Witness Severe neutropenia secondary to chemotherapy Thrombocytopenia secondary to chemotherapy Acute metabolic encephalopathy, resolved Anal cancer, on concurrent chemoradiation Deconditioning   Plan:  - Patient remains to be in ICU, septic shock has overall improved -CT scan from yesterday reviewed, which showed multifocal pneumonia, no other abscess or source of infection. - Labs reviewed, neutropenia has improved, hemoglobin 5.4, platelets 57, slightly lower than yesterday, no active bleeding. - Continue filgrastim  daily until ANC above 1.5K. - I will likely give third dose Retacrit  tomorrow -I encouraged her to increase MiraLAX  dose for constipation, and the use enema if needed - Antibiotics and other management per  primary team - I will follow-up   Onita Mattock, MD 10/27/2023

## 2023-10-27 NOTE — IPAL (Addendum)
  Interdisciplinary Goals of Care Family Meeting   Date carried out: 10/27/2023  Location of the meeting: Bedside  Member's involved: Physician, Bedside Registered Nurse, and Family Member or next of kin  Durable Power of Attorney or acting medical decision maker: Sonny JINNY Ni   Discussion: We discussed goals of care for [employer] .  Met at bedside per request of patient's and family members with RNs present at bedside.  Discussed overall hospital course with downtrending hemoglobin despite aggressive measures (although limited due to her Jehovah's Witness status and refusal of blood products for transfusion) that she is high risk for further decline including stroke, MI, and cardiopulmonary arrest.  Discussed that we will continue aggressive measures but if she experienced cardiopulmonary arrest that resuscitative efforts such as chest compressions, intubation with ventilatory support would be likely futile and causing more pain and suffering.  Patient agrees that she would like to continue full efforts up to that point and would not like any cardiopulmonary resuscitation or ventilatory support if it got to that point.  Family members and friends at bedside and in agreement.  CODE STATUS updated to reflect DNR.  Code status:   Code Status: Do not attempt resuscitation (DNR) PRE-ARREST INTERVENTIONS DESIRED   Disposition: Continue current acute care  Time spent for the meeting: 31 minutes    Camellia JINNY [place], DO  10/27/2023, 4:05 PM

## 2023-10-28 ENCOUNTER — Other Ambulatory Visit: Payer: Medicare (Managed Care)

## 2023-10-28 ENCOUNTER — Ambulatory Visit: Payer: Medicare (Managed Care)

## 2023-10-28 ENCOUNTER — Ambulatory Visit: Payer: Medicare (Managed Care) | Admitting: Hematology

## 2023-10-28 DIAGNOSIS — G9341 Metabolic encephalopathy: Secondary | ICD-10-CM | POA: Diagnosis not present

## 2023-10-28 LAB — BASIC METABOLIC PANEL WITH GFR
Anion gap: 10 (ref 5–15)
BUN: 11 mg/dL (ref 8–23)
CO2: 17 mmol/L — ABNORMAL LOW (ref 22–32)
Calcium: 8.5 mg/dL — ABNORMAL LOW (ref 8.9–10.3)
Chloride: 106 mmol/L (ref 98–111)
Creatinine, Ser: 0.84 mg/dL (ref 0.44–1.00)
GFR, Estimated: >60 mL/min (ref >60)
Glucose, Bld: 104 mg/dL — ABNORMAL HIGH (ref 70–99)
Potassium: 3.6 mmol/L (ref 3.5–5.1)
Sodium: 133 mmol/L — ABNORMAL LOW (ref 135–145)

## 2023-10-28 LAB — DIFFERENTIAL
Abs Immature Granulocytes: 0.1 K/uL — ABNORMAL HIGH (ref 0.00–0.07)
Basophils Absolute: 0.1 K/uL (ref 0.0–0.1)
Basophils Relative: 1 %
Eosinophils Absolute: 0.2 K/uL (ref 0.0–0.5)
Eosinophils Relative: 4 %
Immature Granulocytes: 2 %
Lymphocytes Relative: 1 %
Lymphs Abs: 0.1 K/uL — ABNORMAL LOW (ref 0.7–4.0)
Monocytes Absolute: 0.4 K/uL (ref 0.1–1.0)
Monocytes Relative: 8 %
Neutro Abs: 4 K/uL (ref 1.7–7.7)
Neutrophils Relative %: 84 %

## 2023-10-28 LAB — MAGNESIUM: Magnesium: 1.8 mg/dL (ref 1.7–2.4)

## 2023-10-28 LAB — CBC
HCT: 19.6 % — ABNORMAL LOW (ref 36.0–46.0)
Hemoglobin: 6.1 g/dL — CL (ref 12.0–15.0)
MCH: 31.3 pg (ref 26.0–34.0)
MCHC: 31.1 g/dL (ref 30.0–36.0)
MCV: 100.5 fL — ABNORMAL HIGH (ref 80.0–100.0)
Platelets: 79 K/uL — ABNORMAL LOW (ref 150–400)
RBC: 1.95 MIL/uL — ABNORMAL LOW (ref 3.87–5.11)
RDW: 17.9 % — ABNORMAL HIGH (ref 11.5–15.5)
WBC: 4.9 K/uL (ref 4.0–10.5)
nRBC: 0.4 % — ABNORMAL HIGH (ref 0.0–0.2)

## 2023-10-28 LAB — PROCALCITONIN: Procalcitonin: 2.3 ng/mL

## 2023-10-28 LAB — PHOSPHORUS: Phosphorus: 2 mg/dL — ABNORMAL LOW (ref 2.5–4.6)

## 2023-10-28 MED ORDER — POTASSIUM PHOSPHATES 15 MMOLE/5ML IV SOLN
30.0000 mmol | Freq: Once | INTRAVENOUS | Status: AC
  Administered 2023-10-28: 30 mmol via INTRAVENOUS
  Filled 2023-10-28: qty 10

## 2023-10-28 MED ORDER — MAGNESIUM SULFATE 2 GM/50ML IV SOLN
2.0000 g | Freq: Once | INTRAVENOUS | Status: AC
  Administered 2023-10-28: 2 g via INTRAVENOUS
  Filled 2023-10-28: qty 50

## 2023-10-28 MED ORDER — EPOETIN ALFA-EPBX 2000 UNIT/ML IJ SOLN
2000.0000 [IU] | Freq: Once | INTRAMUSCULAR | Status: AC
  Administered 2023-10-28: 2000 [IU] via SUBCUTANEOUS
  Filled 2023-10-28: qty 1

## 2023-10-28 NOTE — Plan of Care (Signed)

## 2023-10-28 NOTE — Progress Notes (Addendum)
 Misty [name]   DOB:January 09, 1956   FM#:[id number]      ASSESSMENT & PLAN:  Misty [name]. [name] is a 68 year old female patient with oncologic history significant for Anal CA.  She was admitted on 7/30 with weakness and confusion and found to be neutropenic and anemic.    Neutropenic fever Septic shock Pneumonia - Improving - WBC improved to 4.9.  Discontinued G-CSF. - Afebrile, monitor fever curve  Anemia Thrombocytopenia - Likely due to recent chemotherapy - Hemoglobin improved from 5.4-6.1 today - Ordered dose #3 epoietin/Retacrit  to be given today. - Patient is Jehovah witness, no blood transfusions - Platelets 79K, no transfusional intervention required at this time  - Continue to monitor CBC with differential - Oncology/Dr. 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 - Will continue to monitor closely in outpatient oncology  Acute pulmonary embolism - Patient was started on Eliquis  however has been discontinued due to anemia and thrombocytopenia - Will continue to monitor counts and restart DOAC as appropriate  Acute metabolic encephalopathy - Resolved    Code Status DNR-Interventions Desired  Subjective:  Patient seen awake and alert laying in bed, seen earlier being assisted with bedside commode.  Family at bedside.  Reports that she is feeling much better and denies acute complaints.  Objective:   Intake/Output Summary (Last 24 hours) at 10/28/2023 1037 Last data filed at 10/28/2023 0900 Gross per 24 hour  Intake 1029.96 ml  Output 2550 ml  Net -1520.04 ml     PHYSICAL EXAMINATION: ECOG PERFORMANCE STATUS: 3 - Symptomatic, >50% confined to bed  Vitals:   10/28/23 0800 10/28/23 0824  BP: (!) 108/44   Pulse: 78   Resp: 20   Temp: 98.5 F (36.9 C)   SpO2: 96% 96%   Filed Weights   10/22/23 1629 10/26/23 1324  Weight: 158 lb 11.7  oz (72 kg) 159 lb 13.3 oz (72.5 kg)    GENERAL: alert, no distress and comfortable  SKIN: +Pale skin color, texture, turgor are normal, no rashes or significant lesions EYES: normal, conjunctiva are pink and non-injected, sclera clear OROPHARYNX: no exudate, no erythema and lips, buccal mucosa, and tongue normal  NECK: supple, thyroid normal size, non-tender, without nodularity LYMPH: no palpable lymphadenopathy in the cervical, axillary or inguinal LUNGS: clear to auscultation and percussion with normal breathing effort HEART: regular rate & rhythm and no murmurs and no lower extremity edema ABDOMEN: abdomen soft, non-tender and normal bowel sounds  +Foley catheter intact MUSCULOSKELETAL: no cyanosis of digits and no clubbing  PSYCH: alert & oriented x 3 with fluent speech NEURO: no focal motor/sensory deficits   All questions were answered. The patient knows to call the clinic with any problems, questions or concerns.   The total time spent in the appointment was 40 minutes encounter with patient including review of chart and various tests results, discussions about plan of care and coordination of care plan  Misty JINNY Brunner, NP 10/28/2023 10:37 AM    Labs Reviewed:  Lab Results  Component Value Date   WBC 4.9 10/28/2023   HGB 6.1 (LL) 10/28/2023   HCT 19.6 (L) 10/28/2023   MCV 100.5 (H) 10/28/2023   PLT 79 (L) 10/28/2023   Recent Labs    10/20/23 0941 10/22/23 0405 10/23/23 0636 10/26/23 0647 10/27/23 0307 10/28/23 0320  NA 138 131*   < > 131* 136 133*  K 3.6 3.3*   < >  3.1* 4.0 3.6  CL 103 102   < > 103 108 106  CO2 29 22   < > 19* 19* 17*  GLUCOSE 101* 113*   < > 124* 102* 104*  BUN 26* 21   < > 10 11 11   CREATININE 1.24* 1.09*   < > 0.84 0.88 0.84  CALCIUM  9.1 8.5*   < > 8.0* 8.2* 8.5*  GFRNONAA 48* 56*   < > >60 >60 >60  PROT 6.5 5.8*  --   --  4.7*  --   ALBUMIN 3.6 3.0*  --   --  1.7*  --   AST 11* 17  --   --  51*  --   ALT 15 17  --   --  40  --    ALKPHOS 70 61  --   --  50  --   BILITOT 0.4 0.7  --   --  0.3  --   BILIDIR  --   --   --   --  <0.1  --   IBILI  --   --   --   --  NOT CALCULATED  --    < > = values in this interval not displayed.    Studies Reviewed:  CT CHEST ABDOMEN PELVIS W CONTRAST Result Date: 10/26/2023 CLINICAL DATA:  Sepsis. EXAM: CT CHEST, ABDOMEN, AND PELVIS WITH CONTRAST TECHNIQUE: Multidetector CT imaging of the chest, abdomen and pelvis was performed following the standard protocol during bolus administration of intravenous contrast. RADIATION DOSE REDUCTION: This exam was performed according to the departmental dose-optimization program which includes automated exposure control, adjustment of the mA and/or kV according to patient size and/or use of iterative reconstruction technique. CONTRAST:  80mL OMNIPAQUE  IOHEXOL  300 MG/ML  SOLN COMPARISON:  10/22/2023. FINDINGS: CT CHEST FINDINGS Cardiovascular: The heart is normal in size and there is a small pericardial effusion. There is atherosclerotic calcification of the aorta without evidence of aneurysm. The pulmonary trunk is normal in caliber. Mediastinum/Nodes: No mediastinal, hilar, or axillary lymphadenopathy is seen. The thyroid gland and trachea are within normal limits. The esophagus is mildly distended with fluid. Lungs/Pleura: Paraseptal and centrilobular emphysematous changes are noted in the lungs. Scattered airspace disease and consolidation are present in the lungs bilaterally, involving right lower lobe, left lower lobe, and right upper lobe. No effusion or pneumothorax is seen. Musculoskeletal: Degenerative changes are present in the thoracic spine. No acute osseous abnormality is seen. CT ABDOMEN PELVIS FINDINGS Hepatobiliary: No focal liver abnormality is seen. No gallstones, gallbladder wall thickening, or biliary dilatation. Pancreas: Unremarkable. No pancreatic ductal dilatation or surrounding inflammatory changes. Spleen: Normal in size without focal  abnormality. Adrenals/Urinary Tract: The adrenal glands are within normal limits. The kidneys enhance symmetrically. Subcentimeter hypodensities are present in the kidneys bilaterally which are too small to further characterize. No renal calculus or hydronephrosis is seen. Diffuse bladder wall thickening is noted. Stomach/Bowel: The stomach is within normal limits. No bowel obstruction, free air, or pneumatosis is seen. The appendix is surgically absent. There is bowel wall thickening involving the rectum with surrounding inflammatory changes. Vascular/Lymphatic: Aortic atherosclerosis. No enlarged abdominal or pelvic lymph nodes. Reproductive: Status post hysterectomy. No adnexal masses. Other: There is a trace amount of perisplenic free fluid. Edema and a small amount of free fluid are present in the presacral space. Musculoskeletal: Degenerative changes are present in the lumbar spine. No acute osseous abnormality is seen. IMPRESSION: 1. Patchy airspace disease and consolidation in right upper,  right lower, and left lower lobes, concerning for pneumonia. 2. Diffuse bladder wall thickening suggesting infectious or inflammatory cystitis. 3. Rectal wall thickening with surrounding fat stranding and edema, compatible with proctitis. 4. Emphysema. 5. Aortic atherosclerosis and coronary artery calcifications. Electronically Signed   By: Leita Birmingham M.D.   On: 10/26/2023 14:17   CT Angio Chest Pulmonary Embolism (PE) W or WO Contrast Addendum Date: 10/22/2023 ADDENDUM REPORT: 10/22/2023 10:42 ADDENDUM: Critical Value/emergent results were called by telephone at the time of interpretation on 10/22/2023 at 10:32 a.m. [facility] , who verbally acknowledged these results. Electronically Signed   By: Toribio Agreste M.D.   On: 10/22/2023 10:42   Result Date: 10/22/2023 CLINICAL DATA:  History of squamous cell anal carcinoma undergoing chemotherapy. Patient with confusion and possible fever. Suspect  pulmonary embolism. EXAM: CT ANGIOGRAPHY CHEST WITH CONTRAST TECHNIQUE: Multidetector CT imaging of the chest was performed using the standard protocol during bolus administration of intravenous contrast. Multiplanar CT image reconstructions and MIPs were obtained to evaluate the vascular anatomy. RADIATION DOSE REDUCTION: This exam was performed according to the departmental dose-optimization program which includes automated exposure control, adjustment of the mA and/or kV according to patient size and/or use of iterative reconstruction technique. CONTRAST:  75mL OMNIPAQUE  IOHEXOL  350 MG/ML SOLN COMPARISON:  PET-CT 10/22/2023 FINDINGS: Cardiovascular: Mild cardiomegaly. Thoracic aorta is normal in caliber. Minimal calcified plaque over the descending thoracic aorta. Pulmonary arterial system is adequately opacified. Findings suggesting subtle small subsegmental embolus over the left lingular pulmonary artery as well as anterior branch right middle lobe pulmonary artery. Remaining vascular structures are unremarkable. Mediastinum/Nodes: No evidence of mediastinal or hilar adenopathy. Remaining mediastinal structures are unremarkable. Lungs/Pleura: Lungs are adequately inflated. Minimal posterior bibasilar dependent atelectasis. No significant effusion. Suggestion of mild centrilobular emphysematous disease. Airways are normal. Upper Abdomen: No acute findings. Musculoskeletal: No acute or concerning findings. Review of the MIP images confirms the above findings. IMPRESSION: 1. Findings suggesting subtle small subsegmental embolus over the left lingular pulmonary artery as well as anterior branch right middle lobe pulmonary artery. No evidence of right heart strain. 2. Mild cardiomegaly. 3. Suggestion of mild centrilobular emphysematous disease. 4. Aortic atherosclerosis. Aortic Atherosclerosis (ICD10-I70.0) and Emphysema (ICD10-J43.9). Ordering physician has been paged. Electronically Signed: By: Toribio Agreste M.D.  On: 10/22/2023 10:24   CT Head Wo Contrast Result Date: 10/22/2023 CLINICAL DATA:  68 year old female with altered mental status. Stage IV anal rectal cancer. EXAM: CT HEAD WITHOUT CONTRAST TECHNIQUE: Contiguous axial images were obtained from the base of the skull through the vertex without intravenous contrast. RADIATION DOSE REDUCTION: This exam was performed according to the departmental dose-optimization program which includes automated exposure control, adjustment of the mA and/or kV according to patient size and/or use of iterative reconstruction technique. COMPARISON:  Head CT 11/27/2014. FINDINGS: Brain: No midline shift, ventriculomegaly, mass effect, evidence of mass lesion, intracranial hemorrhage or evidence of cortically based acute infarction. Patchy mild for age bilateral cerebral white matter hypodensity. Otherwise maintained gray-white differentiation. No cortical encephalomalacia identified. Vascular: No suspicious intracranial vascular hyperdensity. Calcified atherosclerosis at the skull base. Skull: Intact.  No acute or suspicious osseous lesion identified. Sinuses/Orbits: Visualized paranasal sinuses and mastoids are stable and well aerated. Other: Visualized orbits and scalp soft tissues are within normal limits. IMPRESSION: No acute intracranial abnormality by noncontrast CT. Mild for age cerebral white matter changes, most commonly due to chronic small vessel disease. Electronically Signed   By: VEAR Hurst M.D.   On: 10/22/2023  07:19   DG Chest 2 View Result Date: 10/22/2023 EXAM: 2 VIEW(S) XRAY OF THE CHEST 10/22/2023 04:41:00 AM COMPARISON: 10/08/2019 CLINICAL HISTORY: Fever. Chemo, fever. FINDINGS: LUNGS AND PLEURA: No focal pulmonary opacity. No pulmonary edema. No pleural effusion. No pneumothorax. HEART AND MEDIASTINUM: No acute abnormality of the cardiac and mediastinal silhouettes. Aortic atherosclerotic calcification. BONES AND SOFT TISSUES: Remote fracture deformity involving  the midshaft of right clavicle. No acute osseous abnormality. IMPRESSION: 1. No acute cardiopulmonary pathology. Electronically signed by: Waddell Calk MD 10/22/2023 05:46 AM EDT RP Workstation: [id number]   IR PICC PLACEMENT RIGHT >5 YRS INC IMG GUIDE Result Date: 10/13/2023 INDICATION: 68 year old female with history of anal cancer presents in need of durable venous access. Peripherally inserted central catheter requested. EXAM: PICC LINE PLACEMENT WITH ULTRASOUND AND FLUOROSCOPIC GUIDANCE MEDICATIONS: 5 mL 1% lidocaine  ANESTHESIA/SEDATION: None FLUOROSCOPY: Radiation Exposure Index (as provided by the fluoroscopic device): 1.0 mGy Kerma COMPLICATIONS: None immediate. PROCEDURE: The patient was advised of the possible risks and complications and agreed to undergo the procedure. The patient was then brought to the angiographic suite for the procedure. The right arm was prepped with chlorhexidine , draped in the usual sterile fashion using maximum barrier technique (cap and mask, sterile gown, sterile gloves, large sterile sheet, hand hygiene and cutaneous antisepsis) and infiltrated locally with 1% Lidocaine . Ultrasound demonstrated patency of the right basilic vein, and this was documented with an image. Under real-time ultrasound guidance, this vein was accessed with a 21 gauge micropuncture needle and image documentation was performed. A 0.018 wire was introduced in to the vein. Over this, a 5 [place] dual lumen power PICC was advanced to the lower SVC/right atrial junction. Fluoroscopy during the procedure and fluoro spot radiograph confirms appropriate catheter position. The catheter was flushed and covered with a sterile dressing. Catheter length: 34 IMPRESSION: Successful right arm Power, dual lumen PICC line placement with ultrasound and fluoroscopic guidance. The catheter is ready for use. Performed by: Kacie Matthews PA-C Electronically Signed   By: Cordella Banner   On: 10/13/2023 11:13    Addendum I have seen the patient, examined her. I agree with the assessment and and plan and have edited the notes.   Patient overall feels better today, was able to eat something.  Her neutropenia has resolved.  Hemoglobin also slightly improved, will give the third dose ESA today.  May restart radiation tomorrow.  I will follow-up later this week.  Onita Mattock MD 10/28/2023

## 2023-10-28 NOTE — Progress Notes (Signed)
 PT and caregiver state that PT worked with [employer] prior to breakfast. PT is not feeling well at this time- therefore CPT not completed with RT. RN aware.

## 2023-10-28 NOTE — Progress Notes (Signed)
 PT Cancellation Note  Patient Details Name: LELAR FAREWELL MRN: [id number] DOB: 09/02/1955   Cancelled Treatment:    Reason Eval/Treat Not Completed: Other (comment) Noted pt's hgb up to 6.1 and VSS.  She is Jehovah witness so no plans for PRBC. She was up with nursing to [facility] earlier today.  Checked on pt around 1650 and reports fatigue and wanting to rest before company comes at 1730.  Plan to f/u tomorrow.   Gaila Engebretsen, PT Acute Rehab [facility] [phone]   Benjiman VEAR Mulberry 10/28/2023, 4:52 PM

## 2023-10-28 NOTE — Progress Notes (Signed)
       Code Status/Type confirmation of interventions  NAME: Misty [name] MRN: [id number] DOB : 1955/12/07    Date of Service   10/28/2023   HPI/Events of Note   Notified by RN for Patient and Family request to be DNI only.   The following were discussed with patient/family member in the presence of the RN/personally.    Patient has verified :   CPR  - NO Shock - NO ACLS - NO Vent - NO                   BiPAP - Yes ( Limited time intervention- contact POA if needed)  Vasopressors - NO    Interventions   Plan: CODE Status - DNR/DNI         [facility] [place]

## 2023-10-28 NOTE — Plan of Care (Signed)
  Problem: Education: Goal: Knowledge of General Education information will improve Description: Including pain rating scale, medication(s)/side effects and non-pharmacologic comfort measures Outcome: Progressing   Problem: Clinical Measurements: Goal: Respiratory complications will improve Outcome: Progressing Goal: Cardiovascular complication will be avoided Outcome: Progressing   Problem: Safety: Goal: Ability to remain free from injury will improve Outcome: Progressing   

## 2023-10-28 NOTE — Progress Notes (Addendum)
 PROGRESS NOTE    Misty [name]  FMW:[id number] DOB: 1955/10/03 DOA: 10/22/2023 PCP: Dorcus [name], MD    Brief Narrative:   Misty [name] is a 68 y.o. 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  History obtained from patient as well as friend who is her HCPOA and at bedside at time of admission.  Overall patient having a rough time with her cancer treatments.  Over the last week has been having increasing rectal pain, stool incontinence.  Few days prior on 7/25, radiation oncology, Dr. Dewey increased her Dilaudid  from 2 mg to 4 mg every 4-6 hours as needed.  Additionally patient was seen urgently in the urology office due to urinary retention and had Foley catheter placed.  Her friend reports that since increasing the pain medication, patient has been a little more loopy that was intermittent with slow responses.  Patient's friend heard her in the bathroom making noise and went to check on her and was found to be sweaty, confused and incoherent.  Reports fever up to 101.0 at home.  Patient denies cough, no diarrhea, no chest pain, no shortness of breath.  In the ED, temperature 99.4 F, HR 99, RR 18, BP 107/73, SpO2 93% on room air.  WBC 1.9, hemoglobin 7.7, platelet count 139.  Sodium 131, potassium 3.3, chloride 102, CO2 22, glucose 113, BUN 21, creat 1.09.  AST 17, ALT 17, total bilirubin 0.7.  Lactic acid 0.8.  INR 1.1.  Urinalysis with trace leukocytes, negative nitrite, rare bacteria, 0-5 WBCs.  CT head without contrast with no acute intracranial abnormality, mild cerebral white matter changes consistent with chronic small vessel disease.  CT angiogram  chest with findings of subtle small subsegmental embolus left lingula pulmonary artery and anterior branch of right middle lobe pulmonary artery, no evidence of right heart strain, mild cardiomegaly, mild central lobar emphysematous disease, aortic atherosclerosis.  Urine culture and blood cultures x 2 obtained.  Patient received IV Dilaudid  in the ED and following, patient noted to become hypoxic and placed on 4 L nasal cannula.  TRH consulted for admission for further evaluation management of acute metabolic encephalopathy, acute pulmonary embolism, weakness, debility, uncontrolled pain of malignancy.  Assessment & Plan:   Acute metabolic encephalopathy: Resolved Patient presents with confusion, currently undergoing chemoradiation with recent increase of her pain regimen of Dilaudid  from 2 mg to 4 mg every 4-6 hours.  Friend noted that confusion started thereafter.  Patient also with noted fever, also positive for pulmonary embolism on CT imaging.  CT head without contrast with no acute intracranial abnormality.  Urinalysis unrevealing, no focal consolidation noted on CT chest imaging suggestive of a pneumonia.  Dilaudid  has been discontinued and patient's mental status appears back to baseline. -- Supportive care -- Treatment as below  Febrile neutropenia Pneumonia Severe sepsis, not POA No clear source of infection.  CT chest 7/30 with no concern for pneumonia.  Repeat imaging with CT chest/abdomen/pelvis with contrast 8/3 with findings of patchy airspace disease and consolidation right upper/right lower and left lower lobes consistent with pneumonia, diffuse bladder wall thickening, rectal wall thickening and surrounding fat stranding.  -- Tmax 99.7 past 24 hours -- Blood cultures x 2 7/30: No growth x 5 days --  Blood cultures x 2 8/3: No growth x 2 days -- Urine culture 7/31: No growth -- Will discontinue vancomycin  today -- Continue cefepime ; plan 7-day course -- Monitor fever curve,  supportive care, antipyretics -- Incentive spirometry, flutter valve  Hypotension -- Midodrine  to 10 mg p.o. 3 times daily  Acute pulmonary embolism Patient presenting with fever.  No infectious etiology elucidated.  CT chest angiogram with findings of subtle small subsegmental embolus left lingula pulmonary artery and anterior branch of right middle lobe pulmonary artery, no evidence of right heart strain -- Started on Eliquis ; now discontinued due to worsening anemia/thrombocytopenia on 8/3 -- Continue monitor CBC/platelets closely, if continues to trend down and drops below 50 will need to discontinue anticoagulation  Hypokalemia Repleted.  Potassium 3.6 this morning; repleted with potassium phosphorus -- Repeat electrolytes in the a.m.  Hypophosphatemia Phosphorus 2.0, ordered K-Phos today -- Repeat electrolytes in a.m.  Hypomagnesemia Magnesium  1.8, will replete. -- Repeat electrolytes in a.m.  Jehovah's Witness/blood product refusal Anemia of chronic medical disease Pancytopenia Anemia panel with iron 19, TIBC 160, ferritin 387, folate 37.8, B12 718. -- Hgb 7.7>7.3>6.7>5.8>5.4>6.1 -- Plt 139>106>100>80>60>79 -- Patient Jehovah's Witness, continues to refuse blood products as of 10/27/2023  -- Received Reacrit 8/1 and 8/3 -- Received Filgrastim  on 8/3, 8/4 -- IV iron contraindicated in the setting of active infection -- Eliquis  discontinued 8/3 -- CBC daily  Urinary retention Foley catheter placed by urology 2 days prior to admission.  CT abdomen/pelvis with diffuse bladder wall thickening, likely consistent with chronic bladder outlet obstruction. -- Continue Foley catheter -- Outpatient follow-up with urology  Squamous cell carcinoma anus -- Medical oncology following, appreciate assistance -- Currently on chemoradiation; currently on hold due to illness as above  Pain of malignancy Patient recently had her home regimen of Dilaudid  increased from 2 mg to 4 mg p.o.  4-6 hours as needed.  Family has noted that after this increase, her confusion worsened. -- Discontinued Dilaudid   -- Start oxycodone  5 - 10 mg p.o. every 4 hours as needed moderate/severe pain -- Gabapentin  reduced to 300 mg p.o. twice daily -- Continue home baclofen  10 mg p.o. 3 times daily as needed muscle spasms  Hypothyroidism -- Levothyroxine  25 mcg p.o. daily  Hyperlipidemia -- Atorvastatin  20 mg p.o. daily  Anxiety/depression -- Bupropion  450 mg p.o. every morning -- Valium  5 mg p.o. twice daily as needed anxiety  COPD -- Breztri  2 puffs twice daily -- Singulair  10 mg p.o. nightly -- Albuterol  neb every 2 hours.  Wheezing/shortness of breath  Mucositis -- Magic mouthwash  Insomnia -- Trazodone  25 mg p.o. nightly as needed sleep  Constipation -- Senokot-as 1 tablet p.o. twice daily -- MiraLAX  daily -- Avoid rectal suppositories given rectal pain with history of anal cancer -- Strict I's and O's, monitor bowel movements closely  Weakness/ability/deconditioning/gait disturbance: -- PT/OT recommend SNF placement -- TOC consulted for placement   DVT prophylaxis: Chemical DVT prophylaxis contraindicated in severe anemia/thrombocytopenia    Code Status: Do not attempt resuscitation (DNR) PRE-ARREST INTERVENTIONS DESIRED Family Communication: No family present at bedside this morning  Disposition Plan:  Level of care: Stepdown Status is: Inpatient Remains inpatient appropriate because: IV antibiotics    Consultants:  Medical oncology, Dr. Lanny  Procedures:  none  Antimicrobials:  Vancomycin  8/3 - 8/5 Cefepime  8/1>>   Subjective: Patient seen examined bedside, lying in bed.  Daughter and RN present at bedside and updated.  Hemoglobin and platelet count improved today.  Continuing to hold radiation  treatments today.  Remains afebrile past 48 hours. Remains on IV vancomycin  and cefepime .  Reports some abdominal distention, no pain.  Overnight RN reported  capsules noted in stool.  Denies headache, no visual changes, no chest pain, no palpitations, no abdominal pain, no current fever, no nausea/vomiting, no focal weakness, no fatigue, no paresthesias.  No other acute events/concerns overnight per nursing staff.  Objective: Vitals:   10/28/23 0600 10/28/23 0700 10/28/23 0800 10/28/23 0824  BP: (!) 110/37  (!) 108/44   Pulse: 90 85 78   Resp: (!) 22 20 20    Temp:   98.5 F (36.9 C)   TempSrc:   Oral   SpO2: 99% 96% 96% 96%  Weight:      Height:        Intake/Output Summary (Last 24 hours) at 10/28/2023 0916 Last data filed at 10/28/2023 0558 Gross per 24 hour  Intake 909.96 ml  Output 2550 ml  Net -1640.04 ml   Filed Weights   10/22/23 1629 10/26/23 1324  Weight: 72 kg 72.5 kg    Examination:  Physical Exam: GEN: NAD, alert and oriented x 3, elderly/ill in appearance HEENT: NCAT, PERRL, EOMI, sclera clear, MMM PULM: Breath sounds diminished bilateral bases with rhonchi, no wheezing, normal respiratory effort without accessory muscle use, on 3L [place] with SpO2 97% at rest CV: RRR w/o M/G/R GI: abd soft, NTND, + BS MSK: no peripheral edema, moves all extremities independently NEURO: No focal neurological deficit PSYCH: normal mood/affect Integumentary: No concerning rashes/lesions/wounds noted exposed skin surfaces    Data Reviewed: I have personally reviewed following labs and imaging studies  CBC: Recent Labs  Lab 10/22/23 0405 10/23/23 0636 10/24/23 0556 10/25/23 0713 10/26/23 0647 10/27/23 0307 10/28/23 0320  WBC 1.9*   < > 0.5* 0.5* 0.3* 1.2* 4.9  NEUTROABS 1.3*  --   --   --   --   --   --   HGB 7.7*   < > 6.7* 6.7* 5.8* 5.4* 6.1*  HCT 24.1*   < > 21.9* 21.8* 18.5* 17.5* 19.6*  MCV 98.4   < > 100.0 100.9* 97.9 97.8 100.5*  PLT 139*   < > 100* 80* 66* 57* 79*   < > = values in this interval not displayed.   Basic Metabolic Panel: Recent Labs  Lab 10/23/23 0636 10/24/23 0556 10/25/23 0713 10/26/23 0647  10/27/23 0307 10/28/23 0320  NA 134* 131* 129* 131* 136 133*  K 3.1* 3.5 4.3 3.1* 4.0 3.6  CL 106 105 107 103 108 106  CO2 20* 19* 18* 19* 19* 17*  GLUCOSE 116* 135* 98 124* 102* 104*  BUN 9 10 11 10 11 11   CREATININE 1.10* 0.98 0.91 0.84 0.88 0.84  CALCIUM  7.6* 7.8* 7.7* 8.0* 8.2* 8.5*  MG 2.2 2.0  --   --   --  1.8  PHOS  --   --   --   --   --  2.0*   GFR: Estimated Creatinine Clearance: 62 mL/min (by C-G formula based on SCr of 0.84 mg/dL). Liver Function Tests: Recent Labs  Lab 10/22/23 0405 10/27/23 0307  AST 17 51*  ALT 17 40  ALKPHOS 61 50  BILITOT 0.7 0.3  PROT 5.8* 4.7*  ALBUMIN 3.0* 1.7*   No results for input(s): LIPASE, AMYLASE in the last 168 hours. No results for input(s): AMMONIA in the last 168 hours. Coagulation Profile: Recent Labs  Lab 10/22/23 0405 10/24/23 1105  INR 1.1 1.9*   Cardiac  Enzymes: No results for input(s): CKTOTAL, CKMB, CKMBINDEX, TROPONINI in the last 168 hours. BNP (last 3 results) No results for input(s): PROBNP in the last 8760 hours. HbA1C: No results for input(s): HGBA1C in the last 72 hours. CBG: No results for input(s): GLUCAP in the last 168 hours. Lipid Profile: No results for input(s): CHOL, HDL, LDLCALC, TRIG, CHOLHDL, LDLDIRECT in the last 72 hours. Thyroid Function Tests: No results for input(s): TSH, T4TOTAL, FREET4, T3FREE, THYROIDAB in the last 72 hours. Anemia Panel: No results for input(s): VITAMINB12, FOLATE, FERRITIN, TIBC, IRON, RETICCTPCT in the last 72 hours.  Sepsis Labs: Recent Labs  Lab 10/22/23 0416 10/27/23 0307 10/28/23 0320  PROCALCITON  --  3.89 2.30  LATICACIDVEN 0.8  --   --     Recent Results (from the past 240 hours)  Blood Culture (routine x 2)     Status: None   Collection Time: 10/22/23  4:05 AM   Specimen: BLOOD  Result Value Ref Range Status   Specimen Description   Final    BLOOD RIGHT ANTECUBITAL Performed at [facility], [address], [place], [place] [address]    Special Requests   Final    BOTTLES DRAWN AEROBIC AND ANAEROBIC Blood Culture results may not be optimal due to an inadequate volume of blood received in culture bottles Performed at [facility], [address] [place], [place] [address]    Culture   Final    NO GROWTH 5 DAYS Performed at [facility], [address], [place], [place] [address]    Report Status 10/27/2023 FINAL  Final  Blood Culture (routine x 2)     Status: None   Collection Time: 10/22/23  5:25 AM   Specimen: BLOOD LEFT WRIST  Result Value Ref Range Status   Specimen Description   Final    BLOOD LEFT WRIST Performed at [facility], [address] [place], [place] [address]    Special Requests   Final    BOTTLES DRAWN AEROBIC AND ANAEROBIC Blood Culture results may not be optimal due to an inadequate volume of blood received in culture bottles Performed at [facility], [address], [place], [place] [address]    Culture   Final    NO GROWTH 5 DAYS Performed at [facility], [address], [place], [place] [address]    Report Status 10/27/2023 FINAL  Final  Urine Culture (for pregnant, neutropenic or urologic patients or patients with an indwelling urinary catheter)     Status: None   Collection Time: 10/23/23  7:20 AM   Specimen: Urine, Catheterized  Result Value Ref Range Status   Specimen Description   Final    URINE, CATHETERIZED Performed at [facility], [address], [place], [place] [address]    Special Requests   Final    NONE Performed at [facility], [address], [place], [place] [address]    Culture   Final    NO GROWTH Performed at [facility], [address], [place], [place] [address]    Report Status 10/24/2023 FINAL  Final  MRSA Next Gen by PCR, Nasal     Status: None   Collection Time: 10/26/23 11:32 AM    Specimen: Nasal Mucosa; Nasal Swab  Result Value Ref Range Status   MRSA by PCR Next Gen NOT DETECTED NOT DETECTED Final    Comment: (NOTE) The GeneXpert MRSA  Assay (FDA approved for NASAL specimens only), is one component of a comprehensive MRSA colonization surveillance program. It is not intended to diagnose MRSA infection nor to guide or monitor treatment for MRSA infections. Test performance is not FDA approved in patients less than 69 years old. Performed at [facility], [address], [place], [place] [address]   Culture, blood (Routine X 2) w Reflex to ID Panel     Status: None (Preliminary result)   Collection Time: 10/26/23  3:33 PM   Specimen: BLOOD RIGHT HAND  Result Value Ref Range Status   Specimen Description   Final    BLOOD RIGHT HAND Performed at [facility], [address] [place], [place] [address]    Special Requests   Final    BOTTLES DRAWN AEROBIC AND ANAEROBIC Blood Culture results may not be optimal due to an inadequate volume of blood received in culture bottles Performed at [facility], [address], [place], [place] [address]    Culture   Final    NO GROWTH 2 DAYS Performed at [facility], [address] [place], [place] [address]    Report Status PENDING  Incomplete  Culture, blood (Routine X 2) w Reflex to ID Panel     Status: None (Preliminary result)   Collection Time: 10/26/23  3:33 PM   Specimen: BLOOD RIGHT HAND  Result Value Ref Range Status   Specimen Description   Final    BLOOD RIGHT HAND Performed at [facility], [address], [place], [place] [address]    Special Requests   Final    BOTTLES DRAWN AEROBIC AND ANAEROBIC Blood Culture results may not be optimal due to an inadequate volume of blood received in culture bottles Performed at [facility], [address] [place], [place] [address]    Culture   Final    NO GROWTH 2 DAYS Performed at  [facility], [address], [place], [place] [address]    Report Status PENDING  Incomplete         Radiology Studies: CT CHEST ABDOMEN PELVIS W CONTRAST Result Date: 10/26/2023 CLINICAL DATA:  Sepsis. EXAM: CT CHEST, ABDOMEN, AND PELVIS WITH CONTRAST TECHNIQUE: Multidetector CT imaging of the chest, abdomen and pelvis was performed following the standard protocol during bolus administration of intravenous contrast. RADIATION DOSE REDUCTION: This exam was performed according to the departmental dose-optimization program which includes automated exposure control, adjustment of the mA and/or kV according to patient size and/or use of iterative reconstruction technique. CONTRAST:  80mL OMNIPAQUE  IOHEXOL  300 MG/ML  SOLN COMPARISON:  10/22/2023. FINDINGS: CT CHEST FINDINGS Cardiovascular: The heart is normal in size and there is a small pericardial effusion. There is atherosclerotic calcification of the aorta without evidence of aneurysm. The pulmonary trunk is normal in caliber. Mediastinum/Nodes: No mediastinal, hilar, or axillary lymphadenopathy is seen. The thyroid gland and trachea are within normal limits. The esophagus is mildly distended with fluid. Lungs/Pleura: Paraseptal and centrilobular emphysematous changes are noted in the lungs. Scattered airspace disease and consolidation are present in the lungs bilaterally, involving right lower lobe, left lower lobe, and right upper lobe. No effusion or pneumothorax is seen. Musculoskeletal: Degenerative changes are present in the thoracic spine. No acute osseous abnormality is seen. CT ABDOMEN PELVIS FINDINGS Hepatobiliary: No focal liver abnormality is seen. No gallstones, gallbladder wall thickening, or biliary dilatation. Pancreas: Unremarkable. No pancreatic ductal dilatation or surrounding inflammatory changes. Spleen:  Normal in size without focal abnormality. Adrenals/Urinary Tract: The adrenal glands are within normal limits. The kidneys  enhance symmetrically. Subcentimeter hypodensities are present in the kidneys bilaterally which are too small to further characterize. No renal calculus or hydronephrosis is seen. Diffuse bladder wall thickening is noted. Stomach/Bowel: The stomach is within normal limits. No bowel obstruction, free air, or pneumatosis is seen. The appendix is surgically absent. There is bowel wall thickening involving the rectum with surrounding inflammatory changes. Vascular/Lymphatic: Aortic atherosclerosis. No enlarged abdominal or pelvic lymph nodes. Reproductive: Status post hysterectomy. No adnexal masses. Other: There is a trace amount of perisplenic free fluid. Edema and a small amount of free fluid are present in the presacral space. Musculoskeletal: Degenerative changes are present in the lumbar spine. No acute osseous abnormality is seen. IMPRESSION: 1. Patchy airspace disease and consolidation in right upper, right lower, and left lower lobes, concerning for pneumonia. 2. Diffuse bladder wall thickening suggesting infectious or inflammatory cystitis. 3. Rectal wall thickening with surrounding fat stranding and edema, compatible with proctitis. 4. Emphysema. 5. Aortic atherosclerosis and coronary artery calcifications. Electronically Signed   By: Leita Birmingham M.D.   On: 10/26/2023 14:17         Scheduled Meds:  atorvastatin   20 mg Oral Daily   budesonide -glycopyrrolate -formoterol   2 puff Inhalation BID   buPROPion   450 mg Oral q morning   Chlorhexidine  Gluconate Cloth  6 each Topical Daily   filgrastim  (NIVESTYM ) SQ  480 mcg Subcutaneous q1800   gabapentin   300 mg Oral BID   lamoTRIgine   200 mg Oral QHS   levothyroxine   75 mcg Oral Q0600   liver oil-zinc  oxide   Topical BID   loratadine   10 mg Oral Daily   midodrine   10 mg Oral TID WC   montelukast   10 mg Oral QHS   mouth rinse  15 mL Mouth Rinse 4 times per day   prenatal multivitamin  1 tablet Oral Q1200   valACYclovir   500 mg Oral Daily    Continuous Infusions:  ceFEPime  (MAXIPIME ) IV Stopped (10/28/23 0555)   potassium PHOSPHATE  IVPB (in mmol) 30 mmol (10/28/23 0752)   promethazine  (PHENERGAN ) injection (IM or IVPB)     vancomycin  1,250 mg (10/28/23 0851)     LOS: 4 days    Time spent: 50 minutes spent on 10/28/2023 caring for this patient face-to-face including chart review, ordering labs/tests, documenting, discussion with nursing staff, consultants, updating family and interview/physical exam    Camellia PARAS [place], DO [facility] Available via Epic secure chat 7am-7pm After these hours, please refer to coverage provider listed on [email or web address] 10/28/2023, 9:17 AM

## 2023-10-28 NOTE — TOC Progression Note (Addendum)
 Transition of Care [facility]) - Progression Note    Patient Details  Name: Misty [name] MRN: [id number] Date of Birth: 04-Jul-1955  [facility] CM/SW Contact  Misty ONEIDA Anon, RN Phone Number: 10/28/2023, 9:29 AM  Clinical Narrative:    NCM met with pt at bedside to discuss SNF placement and bed offers. Pt states she still want to go to a SNF as she feels it will be beneficial. States she needs time to make a decision and would like to talk it over with HCPOA. Informed pt would check back later to get decision. Care Management following.  Pt was given the following bed offers:  [facility]   [address] [place] [address] [phone] 2 stars  [facility]   [address], [place] [address] [phone] 1 star   Addendum:  1227: Met with pt and HCPOA Misty [name] at bedside and she states they do not want to proceed with either bed offer. Would like [facility] to look at pt again. NCM spoke with Misty [name] and she states she does not have any bed availability and can not offer a bed at this time. NCM informed pt HCPOA and she voiced understanding.    Expected Discharge Plan: Skilled Nursing Facility Barriers to Discharge: Continued Medical Work up               Expected Discharge Plan and Services In-house Referral: NA Discharge Planning Services: CM Consult Post Acute Care Choice: IP Rehab Living arrangements for the past 2 months: Apartment                 DME Arranged: N/A DME Agency: NA       HH Arranged: NA HH Agency: NA         Social Drivers of Health (SDOH) Interventions SDOH Screenings   Food Insecurity: No Food Insecurity (10/22/2023)  Recent Concern: Food Insecurity - Food Insecurity Present (08/17/2023)   Received from [facility]  Housing: Low Risk  (10/22/2023)  Transportation Needs: No Transportation Needs (10/22/2023)  Utilities: Not At Risk (10/22/2023)  Depression (PHQ2-9): Low  Risk  (10/20/2023)  Financial Resource Strain: High Risk (08/17/2023)   Received from [facility]  Physical Activity: Insufficiently Active (08/17/2023)   Received from [facility]  Social Connections: Moderately Integrated (10/22/2023)  Stress: No Stress Concern Present (08/17/2023)   Received from [facility]  Tobacco Use: Medium Risk (10/22/2023)    Readmission Risk Interventions     No data to display

## 2023-10-29 ENCOUNTER — Ambulatory Visit: Payer: Medicare (Managed Care)

## 2023-10-29 ENCOUNTER — Encounter: Payer: Self-pay | Admitting: Radiation Oncology

## 2023-10-29 DIAGNOSIS — G9341 Metabolic encephalopathy: Secondary | ICD-10-CM | POA: Diagnosis not present

## 2023-10-29 LAB — BASIC METABOLIC PANEL WITH GFR
Anion gap: 10 (ref 5–15)
BUN: 11 mg/dL (ref 8–23)
CO2: 18 mmol/L — ABNORMAL LOW (ref 22–32)
Calcium: 8.3 mg/dL — ABNORMAL LOW (ref 8.9–10.3)
Chloride: 108 mmol/L (ref 98–111)
Creatinine, Ser: 0.76 mg/dL (ref 0.44–1.00)
GFR, Estimated: >60 mL/min (ref >60)
Glucose, Bld: 102 mg/dL — ABNORMAL HIGH (ref 70–99)
Potassium: 3.3 mmol/L — ABNORMAL LOW (ref 3.5–5.1)
Sodium: 136 mmol/L (ref 135–145)

## 2023-10-29 LAB — CBC
HCT: 18.6 % — ABNORMAL LOW (ref 36.0–46.0)
Hemoglobin: 5.8 g/dL — CL (ref 12.0–15.0)
MCH: 31.5 pg (ref 26.0–34.0)
MCHC: 31.2 g/dL (ref 30.0–36.0)
MCV: 101.1 fL — ABNORMAL HIGH (ref 80.0–100.0)
Platelets: 82 K/uL — ABNORMAL LOW (ref 150–400)
RBC: 1.84 MIL/uL — ABNORMAL LOW (ref 3.87–5.11)
RDW: 18.3 % — ABNORMAL HIGH (ref 11.5–15.5)
WBC: 5.2 K/uL (ref 4.0–10.5)
nRBC: 0 % (ref 0.0–0.2)

## 2023-10-29 LAB — MAGNESIUM: Magnesium: 2.2 mg/dL (ref 1.7–2.4)

## 2023-10-29 LAB — PHOSPHORUS: Phosphorus: 2.6 mg/dL (ref 2.5–4.6)

## 2023-10-29 MED ORDER — POTASSIUM CHLORIDE CRYS ER 20 MEQ PO TBCR
40.0000 meq | EXTENDED_RELEASE_TABLET | Freq: Once | ORAL | Status: AC
  Administered 2023-10-29: 40 meq via ORAL
  Filled 2023-10-29: qty 2

## 2023-10-29 MED ORDER — SILVER SULFADIAZINE 1 % EX CREA
TOPICAL_CREAM | Freq: Two times a day (BID) | CUTANEOUS | Status: DC
  Administered 2023-10-29 – 2023-11-02 (×6): 1 via TOPICAL
  Filled 2023-10-29: qty 50

## 2023-10-29 MED ORDER — ENSURE PLUS HIGH PROTEIN PO LIQD
237.0000 mL | Freq: Two times a day (BID) | ORAL | Status: DC
  Administered 2023-10-29 – 2023-11-03 (×11): 237 mL via ORAL

## 2023-10-29 MED ORDER — MORPHINE SULFATE ER 15 MG PO TBCR
15.0000 mg | EXTENDED_RELEASE_TABLET | Freq: Two times a day (BID) | ORAL | Status: DC
  Administered 2023-10-29 – 2023-11-03 (×11): 15 mg via ORAL
  Filled 2023-10-29 (×10): qty 1

## 2023-10-29 MED ORDER — LORATADINE 10 MG PO TABS: 10.0000 mg | ORAL_TABLET | Freq: Every day | ORAL | Status: DC | PRN

## 2023-10-29 NOTE — Plan of Care (Signed)
   Problem: Clinical Measurements: Goal: Respiratory complications will improve Outcome: Progressing Goal: Cardiovascular complication will be avoided Outcome: Progressing   Problem: Activity: Goal: Risk for activity intolerance will decrease Outcome: Progressing   Problem: Nutrition: Goal: Adequate nutrition will be maintained Outcome: Progressing

## 2023-10-29 NOTE — Progress Notes (Addendum)
 Physical Therapy Treatment Patient Details Name: Misty [name] MRN: [id number] DOB: 12/09/1955 Today's Date: 10/29/2023   History of Present Illness Misty [name] is a 68 y.o. female presents with worsening of anal pain, fecal incontinence, confusion and development of fever. 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 HGB 5.8. patient tolerated  sitting and transferring to recliner with min./mod assistance.  BP 88/42, 125/40/118/45_ final sitting upright in recliner.   SPo2 on 2 L > 96% Patient will benefit from continued inpatient follow up therapy, <3 hours/day    If plan is discharge home, recommend the following: A lot of help with walking and/or transfers;A lot of help with bathing/dressing/bathroom;Assistance with cooking/housework;Direct supervision/assist for financial management   Can travel by private vehicle     No  Equipment Recommendations  Rolling walker (2 wheels)    Recommendations for Other Services       Precautions / Restrictions Precautions Precaution/Restrictions Comments: concurrent chemoradiation, very low hgb but ok to mobilize OOB Restrictions Weight Bearing Restrictions Per Provider Order: No     Mobility  Bed Mobility   Bed Mobility: Supine to Sit     Supine to sit: Contact guard     General bed mobility comments: pt. moved tositting with extra time, boosting buttocks to prevent shearing    Transfers Overall transfer level: Needs assistance Equipment used: Rolling walker (2 wheels) Transfers: Sit to/from Stand Sit to Stand: Min assist   Step pivot transfers: Min assist       General transfer comment: min A to power to stand , increased shakiness with time on feet, 1 Hand hold  assist, cues to reach for armrest to step pivot to recliner wtih min A to steady    Ambulation/Gait                    Stairs             Wheelchair Mobility     Tilt Bed    Modified Rankin (Stroke Patients Only)       Balance   Sitting-balance support: Feet supported, Bilateral upper extremity supported Sitting balance-Leahy Scale: Good Sitting balance - Comments: static sitting-good. dynamic sitting-fair (limited by buttocks pain and guarding)   Standing balance support: During functional activity, Single extremity supported Standing balance-Leahy Scale: Poor Standing balance comment: mn assist to steady                            Communication Communication Communication: No apparent difficulties  Cognition Arousal: Alert Behavior During Therapy: WFL for tasks assessed/performed   PT - Cognitive impairments: No apparent impairments                         Following commands: Intact      Cueing    Exercises Other Exercises Other Exercises: yellow theraband UE exerises instruction    General Comments        Pertinent Vitals/Pain Pain Assessment Faces Pain Scale: Hurts whole lot Pain Descriptors / Indicators: Discomfort, Grimacing, Guarding Pain Intervention(s): Monitored during session, Repositioned    Home Living                          Prior Function            PT  Goals (current goals can now be found in the care plan section) Progress towards PT goals: Progressing toward goals    Frequency    Min 2X/week      PT Plan      Co-evaluation              AM-PAC PT 6 Clicks Mobility   Outcome Measure  Help needed turning from your back to your side while in a flat bed without using bedrails?: A Little Help needed moving from lying on your back to sitting on the side of a flat bed without using bedrails?: A Little Help needed moving to and from a bed to a chair (including a wheelchair)?: A Lot Help needed standing up from a chair using your arms (e.g., wheelchair or bedside chair)?: A Lot Help needed to walk  in hospital room?: Total Help needed climbing 3-5 steps with a railing? : Total 6 Click Score: 12    End of Session   Activity Tolerance: Patient tolerated treatment well Patient left: in chair;with call bell/phone within reach;with family/visitor present Nurse Communication: Mobility status;Other (comment) PT Visit Diagnosis: Unsteadiness on feet (R26.81);Muscle weakness (generalized) (M62.81);Pain     Time: 9089-9055 PT Time Calculation (min) (ACUTE ONLY): 34 min  Charges:    $Therapeutic Activity: 23-37 mins PT General Charges $$ ACUTE PT VISIT: 1 Visit                     {Lejla Moeser [facility] PT Acute Rehabilitation Services Office [phone]    Leigh Darice Norris 10/29/2023, 12:42 PM

## 2023-10-29 NOTE — Progress Notes (Signed)
 PROGRESS NOTE    TAKIYAH [name]  FMW:[id number] DOB: Feb 07, 1956 DOA: 10/22/2023 PCP: Dorcus [name], MD   Brief Narrative:  Misty [name] is a 68 y.o. female with past medical history significant for squamous cell carcinoma anus currently undergoing concurrent chemoradiation, cancer related pain, urinary retention s/p Foley catheter placement (urology 2 days prior to admission), COPD, hypothyroidism, anxiety/depression who presented to [facility] ED on 10/22/2023 from home via EMS with confusion, worsening rectal pain, fever. [facility] consulted for admission for further evaluation management of acute metabolic encephalopathy, acute pulmonary embolism, weakness, debility, uncontrolled pain of malignancy.   Assessment & Plan:   Principal Problem:   Encephalopathy, metabolic Active Problems:   Squamous cell carcinoma of anus (HCC)   Febrile neutropenia (HCC)   Refusal of blood product   Acute metabolic encephalopathy: Resolved Likely multifactorial in the setting of concurrent chemoradiation, multiple CNS depressants/opiates as well as fever with acutely noted PE. - CT head unremarkable for acute findings - UA within normal limits - Weaned narcotics with appropriate improvement in mental status  Febrile neutropenia Pneumonia Severe sepsis, not POA - Without clear source, CT chest abdomen pelvis with questionable pneumonia - Continue broad-spectrum antibiotics(cefepime  for 7-day course) - Blood cultures x 2 7/30: No growth x 5 days - Blood cultures x 2 8/3: No growth x 2 days - Urine culture 7/31: No growth - Continue cefepime ; plan 7-day course   Profound symptomatic anemia  - Complicated by treatment given above as well as known active squamous cell carcinoma of the anus - [employer] unable to receive transfusion - Discontinue daily labs to avoid further iatrogenic anemia - Hemoglobin generally downtrending, oncology following status post filgrastim  and EPO - Resume  iron at discharge - Eliquis  discontinued -would resume once patient's CBC improves - FOBT pending  Hypotension - Midodrine  to 10 mg p.o. 3 times daily   Acute pulmonary embolism -Holding Eliquis  given worsening anemia and thrombocytopenia   Hypokalemia Hypophosphatemia Hypomagnesemia - Likely secondary to poor p.o. intake, advance diet as tolerated - Hold further blood draws given anemia    Urinary retention Foley catheter placed by urology 2 days prior to admission.  CT abdomen/pelvis with diffuse bladder wall thickening, likely consistent with chronic bladder outlet obstruction. -- Continue Foley catheter, outpatient follow-up with urology   Squamous cell carcinoma anus - Medical oncology following, appreciate assistance - Currently on chemoradiation; currently on hold due to illness/anemia given above -Lengthy discussion today at bedside with patient and daughter -given limited options here for treatment given ongoing anemia, not currently candidate for further chemotherapy or radiation we have discussed possible disposition with outpatient follow-up to avoid further labs and in attempts to prevent worsening anemia.   Pain of malignancy - Improving on current regimen, continue oxycodone  IR, morphine  IV - Add MS contin  low dose - Attempted transition off of IV narcotics over the next 24 hours - Gabapentin  reduced to 300 mg p.o. twice daily - Continue home baclofen  10 mg p.o. 3 times daily as needed muscle spasms (not utilizing over past 48h)  Hypothyroidism - Levothyroxine  25 mcg p.o. daily   Hyperlipidemia - Atorvastatin  20 mg p.o. daily   Anxiety/depression - Bupropion  450 mg p.o. every morning - Valium  5 mg p.o. twice daily as needed anxiety   COPD, not in acute exacerbation - Breztri  2 puffs twice daily - Singulair  10 mg p.o. nightly - Albuterol  neb every 2 hours.  Wheezing/shortness of breath   Mucositis - Magic mouthwash   Insomnia -  Trazodone  25 mg p.o.  nightly as needed sleep   Constipation - Senokot S/MiraLAX  daily - Avoid rectal suppositories given rectal pain with history of anal cancer - Strict I's and O's, monitor bowel movements closely   Weakness/ambulatory dysfunction/deconditioning/gait disturbance: -- PT/OT recommend SNF placement -- TOC consulted for placement  DVT prophylaxis: Chemical prophylaxis on hold due to profound anemia/thrombocytopenia Code Status:   Code Status: Limited: Do not attempt resuscitation (DNR) -DNR-LIMITED -Do Not Intubate/DNI   Family Communication: Daughter at bedside  Status is: Inpatient  Dispo: The patient is from: Home              Anticipated d/c is to: To be determined              Anticipated d/c date is: 24 to 48 hours              Patient currently not medically stable for discharge  Consultants:  Oncology, radiation oncology  Procedures:  As above  Antimicrobials:  Cefepime  x 7 days  Subjective: Acute issues or events overnight denies nausea vomiting diarrhea headache fevers chills or chest pain  Objective: Vitals:   10/29/23 0500 10/29/23 0600 10/29/23 0700 10/29/23 0705  BP:  (!) 99/34    Pulse: 77 74 80 81  Resp: 16 15 19 20   Temp:      TempSrc:      SpO2: 94% 92% (!) 89% 94%  Weight:      Height:        Intake/Output Summary (Last 24 hours) at 10/29/2023 0729 Last data filed at 10/29/2023 0724 Gross per 24 hour  Intake 1180.47 ml  Output 2650 ml  Net -1469.53 ml   Filed Weights   10/22/23 1629 10/26/23 1324  Weight: 72 kg 72.5 kg    Examination:  General exam: Appears calm and comfortable  Respiratory system: Clear to auscultation. Respiratory effort normal. Cardiovascular system: S1 & S2 heard, RRR. No JVD, murmurs, rubs, gallops or clicks. No pedal edema. Gastrointestinal system: Abdomen is nondistended, soft and nontender. No organomegaly or masses felt. Normal bowel sounds heard. Central nervous system: Alert and oriented. No focal neurological  deficits. Extremities: Symmetric 5 x 5 power. Skin: No rashes, lesions or ulcers Psychiatry: Judgement and insight appear normal. Mood & affect appropriate.     Data Reviewed: I have personally reviewed following labs and imaging studies  CBC: Recent Labs  Lab 10/25/23 0713 10/26/23 0647 10/27/23 0307 10/28/23 0320 10/29/23 0316  WBC 0.5* 0.3* 1.2* 4.9 5.2  NEUTROABS  --   --   --  4.0  --   HGB 6.7* 5.8* 5.4* 6.1* 5.8*  HCT 21.8* 18.5* 17.5* 19.6* 18.6*  MCV 100.9* 97.9 97.8 100.5* 101.1*  PLT 80* 66* 57* 79* 82*   Basic Metabolic Panel: Recent Labs  Lab 10/23/23 0636 10/24/23 0556 10/25/23 0713 10/26/23 0647 10/27/23 0307 10/28/23 0320 10/29/23 0316  NA 134* 131* 129* 131* 136 133* 136  K 3.1* 3.5 4.3 3.1* 4.0 3.6 3.3*  CL 106 105 107 103 108 106 108  CO2 20* 19* 18* 19* 19* 17* 18*  GLUCOSE 116* 135* 98 124* 102* 104* 102*  BUN 9 10 11 10 11 11 11   CREATININE 1.10* 0.98 0.91 0.84 0.88 0.84 0.76  CALCIUM  7.6* 7.8* 7.7* 8.0* 8.2* 8.5* 8.3*  MG 2.2 2.0  --   --   --  1.8 2.2  PHOS  --   --   --   --   --  2.0* 2.6   GFR: Estimated Creatinine Clearance: 65.1 mL/min (by C-G formula based on SCr of 0.76 mg/dL). Liver Function Tests: Recent Labs  Lab 10/27/23 0307  AST 51*  ALT 40  ALKPHOS 50  BILITOT 0.3  PROT 4.7*  ALBUMIN 1.7*   No results for input(s): LIPASE, AMYLASE in the last 168 hours. No results for input(s): AMMONIA in the last 168 hours. Coagulation Profile: Recent Labs  Lab 10/24/23 1105  INR 1.9*   Cardiac Enzymes: No results for input(s): CKTOTAL, CKMB, CKMBINDEX, TROPONINI in the last 168 hours. BNP (last 3 results) No results for input(s): PROBNP in the last 8760 hours. HbA1C: No results for input(s): HGBA1C in the last 72 hours. CBG: No results for input(s): GLUCAP in the last 168 hours. Lipid Profile: No results for input(s): CHOL, HDL, LDLCALC, TRIG, CHOLHDL, LDLDIRECT in the last 72  hours. Thyroid Function Tests: No results for input(s): TSH, T4TOTAL, FREET4, T3FREE, THYROIDAB in the last 72 hours. Anemia Panel: No results for input(s): VITAMINB12, FOLATE, FERRITIN, TIBC, IRON, RETICCTPCT in the last 72 hours. Sepsis Labs: Recent Labs  Lab 10/27/23 0307 10/28/23 0320  PROCALCITON 3.89 2.30    Recent Results (from the past 240 hours)  Blood Culture (routine x 2)     Status: None   Collection Time: 10/22/23  4:05 AM   Specimen: BLOOD  Result Value Ref Range Status   Specimen Description   Final    BLOOD RIGHT ANTECUBITAL Performed at [facility], [address], [place], [place] [address]    Special Requests   Final    BOTTLES DRAWN AEROBIC AND ANAEROBIC Blood Culture results may not be optimal due to an inadequate volume of blood received in culture bottles Performed at [facility], [address], [place], [place] [address]    Culture   Final    NO GROWTH 5 DAYS Performed at [facility], [address], [place], [place] [address]    Report Status 10/27/2023 FINAL  Final  Blood Culture (routine x 2)     Status: None   Collection Time: 10/22/23  5:25 AM   Specimen: BLOOD LEFT WRIST  Result Value Ref Range Status   Specimen Description   Final    BLOOD LEFT WRIST Performed at [facility], [address], [place], [place] [address]    Special Requests   Final    BOTTLES DRAWN AEROBIC AND ANAEROBIC Blood Culture results may not be optimal due to an inadequate volume of blood received in culture bottles Performed at [facility], [address], [place], [place] [address]    Culture   Final    NO GROWTH 5 DAYS Performed at [facility], [address], [place], [place] [address]    Report Status 10/27/2023 FINAL  Final  Urine Culture (for pregnant, neutropenic or urologic patients or patients with an indwelling urinary catheter)     Status: None    Collection Time: 10/23/23  7:20 AM   Specimen: Urine, Catheterized  Result Value Ref Range Status   Specimen Description   Final    URINE, CATHETERIZED Performed at [facility], [address] [place], [place] [address]    Special Requests   Final    NONE Performed at [facility], [address], [place], [place] [address]    Culture   Final    NO GROWTH Performed at [facility], [address] [place], [place] [address]    Report Status 10/24/2023 FINAL  Final  MRSA Next Gen by PCR, Nasal     Status: None   Collection Time: 10/26/23 11:32 AM   Specimen: Nasal Mucosa; Nasal Swab  Result Value Ref Range Status   MRSA by PCR Next Gen NOT DETECTED NOT DETECTED Final    Comment: (NOTE) The GeneXpert MRSA Assay (FDA approved for NASAL specimens only), is one component of a comprehensive MRSA colonization surveillance program. It is not intended to diagnose MRSA infection nor to guide or monitor treatment for MRSA infections. Test performance is not FDA approved in patients less than 50 years old. Performed at [facility], [address], [place], [place] [address]   Culture, blood (Routine X 2) w Reflex to ID Panel     Status: None (Preliminary result)   Collection Time: 10/26/23  3:33 PM   Specimen: BLOOD RIGHT HAND  Result Value Ref Range Status   Specimen Description   Final    BLOOD RIGHT HAND Performed at [facility], [address] [place], [place] [address]    Special Requests   Final    BOTTLES DRAWN AEROBIC AND ANAEROBIC Blood Culture results may not be optimal due to an inadequate volume of blood received in culture bottles Performed at [facility], [address], [place], [place] [address]    Culture   Final    NO GROWTH 2 DAYS Performed at [facility], [address] [place], [place] [address]    Report Status PENDING  Incomplete  Culture, blood (Routine X 2)  w Reflex to ID Panel     Status: None (Preliminary result)   Collection Time: 10/26/23  3:33 PM   Specimen: BLOOD RIGHT HAND  Result Value Ref Range Status   Specimen Description   Final    BLOOD RIGHT HAND Performed at [facility], [address] [place], [place] [address]    Special Requests   Final    BOTTLES DRAWN AEROBIC AND ANAEROBIC Blood Culture results may not be optimal due to an inadequate volume of blood received in culture bottles Performed at [facility], [address] [place], [place] [address]    Culture   Final    NO GROWTH 2 DAYS Performed at [facility], [address] [place], [place] [address]    Report Status PENDING  Incomplete         Radiology Studies: No results found.      Scheduled Meds:  atorvastatin   20 mg Oral Daily   budesonide -glycopyrrolate -formoterol   2 puff Inhalation BID   buPROPion   450 mg Oral q morning   Chlorhexidine  Gluconate Cloth  6 each Topical Daily   gabapentin   300 mg Oral BID   lamoTRIgine   200 mg Oral QHS   levothyroxine   75 mcg Oral Q0600   liver oil-zinc  oxide   Topical BID   loratadine   10 mg Oral Daily   midodrine   10 mg Oral TID WC   montelukast   10 mg Oral QHS   mouth rinse  15 mL Mouth Rinse 4 times per day   prenatal multivitamin  1 tablet Oral Q1200   valACYclovir   500 mg Oral Daily   Continuous Infusions:  ceFEPime  (MAXIPIME ) IV 200 mL/hr at 10/29/23 0724   promethazine  (PHENERGAN ) injection (IM or IVPB) Stopped (10/28/23 2347)     LOS: 5 days   Time  spent:  Elsie JAYSON [place], DO [facility]  If 7PM-7AM, please contact night-coverage [email or web address]  10/29/2023, 7:29 AM

## 2023-10-29 NOTE — Progress Notes (Signed)
 I spoke with the patient and her daughter by phone and we discussed her hospitalization and current clinical situation. She is interested in forgoing the last two treatments based on her skin reaction from radiation. We discussed that it would be difficult to say what the overall benefit would be of finishing the remaning treatments versus forgoing them, but she is motivated to discontinue which I think is reasonable. I reviewed the images taken clinically of the perineal/groin areas and recommend she use silvadene  cream topically to the open areas of skin and apply this BID with non adherent dressings. While she has impressive appearing skin changes, these are not outside of the range of what we consider typical for this type of therapy, and we anticipate in the next 2-3 weeks her skin should significantly improve without other local wound care. I would avoid any other topical agents for now, but we could revisit this in the next few days or into early next week. The patient and her daughter are comfortable with this plan and we will follow along during her hospitalization.     Donald KYM Husband, PAC

## 2023-10-29 NOTE — Progress Notes (Cosign Needed)
 LOVA [name]   DOB:Aug 05, 1955   FM#:[id number]      ASSESSMENT & PLAN:  Misty Koffman. Misty [name] is a 68 year old female patient with oncologic history significant for Anal CA.  She was admitted on 7/30 with weakness and confusion and found to be neutropenic and anemic.     Anemia Thrombocytopenia - Likely due to recent chemotherapy - Hemoglobin worsened 5.4--6.1-- 5.8 today - Dose #3 epoietin/Retacrit  given 8/5.  Plan 4th dose tomorrow or Friday. - Patient is Jehovah witness, no blood transfusions - Platelets improving 82K, no intervention required at this time  - May HOLD repeat labs at this time.  - Oncology/Dr. 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 -- Patient requesting discontinuation of last two radiation therapies due to ongoing rectal pain and discomfort, reports she has received 28 of 30 treatments.  Rad Onc made aware.  - Will continue to monitor closely in outpatient oncology  Neutropenic fever Septic shock Pneumonia - Improving - WBC improved to 5.2. today.  Discontinued G-CSF. - Afebrile, monitor fever curve   Acute pulmonary embolism - Patient was started on Eliquis  however has been discontinued due to anemia and thrombocytopenia - Will continue to monitor counts and restart DOAC as appropriate   Acute metabolic encephalopathy - Resolved      Code Status DNR-Limited  Subjective:  Patient seen awake and alert laying in bed. Reports that she feels okay however has ongoing rectal pain and would like to discontinue further radiation therapy. Patient's daughter at bedside and agrees.  No other complaints.  Message relayed to Providers.   Objective:   Intake/Output Summary (Last 24 hours) at 10/29/2023 1245 Last data filed at 10/29/2023 0818 Gross per 24 hour  Intake 1155.31 ml  Output 2200 ml  Net -1044.69 ml     PHYSICAL  EXAMINATION: ECOG PERFORMANCE STATUS: 3 - Symptomatic, >50% confined to bed  Vitals:   10/29/23 1100 10/29/23 1200  BP:  116/68  Pulse: 87 79  Resp: 19 17  Temp:    SpO2: 100% 100%   Filed Weights   10/22/23 1629 10/26/23 1324  Weight: 158 lb 11.7 oz (72 kg) 159 lb 13.3 oz (72.5 kg)    GENERAL: alert, no distress and comfortable SKIN: +Pale skin color, texture, turgor are normal, no rashes or significant lesions EYES: normal, conjunctiva are pink and non-injected, sclera clear OROPHARYNX: no exudate, no erythema and lips, buccal mucosa, and tongue normal  NECK: supple, thyroid normal size, non-tender, without nodularity LYMPH: no palpable lymphadenopathy in the cervical, axillary or inguinal LUNGS: clear to auscultation and percussion with normal breathing effort HEART: regular rate & rhythm and no murmurs and no lower extremity edema ABDOMEN: +Rectal discomfort/pain MUSCULOSKELETAL: no cyanosis of digits and no clubbing  PSYCH: alert & oriented x 3 with fluent speech NEURO: no focal motor/sensory deficits   All questions were answered. The patient knows to call the clinic with any problems, questions or concerns.   The total time spent in the appointment was 40 minutes encounter with patient including review of chart and various tests results, discussions about plan of care and coordination of care plan  Misty JINNY Brunner, NP 10/29/2023 12:45 PM    Labs Reviewed:  Lab Results  Component Value Date   WBC 5.2 10/29/2023   HGB 5.8 (LL) 10/29/2023   HCT 18.6 (L) 10/29/2023   MCV 101.1 (H) 10/29/2023  PLT 82 (L) 10/29/2023   Recent Labs    10/20/23 0941 10/22/23 0405 10/23/23 0636 10/27/23 0307 10/28/23 0320 10/29/23 0316  NA 138 131*   < > 136 133* 136  K 3.6 3.3*   < > 4.0 3.6 3.3*  CL 103 102   < > 108 106 108  CO2 29 22   < > 19* 17* 18*  GLUCOSE 101* 113*   < > 102* 104* 102*  BUN 26* 21   < > 11 11 11   CREATININE 1.24* 1.09*   < > 0.88 0.84 0.76  CALCIUM  9.1  8.5*   < > 8.2* 8.5* 8.3*  GFRNONAA 48* 56*   < > >60 >60 >60  PROT 6.5 5.8*  --  4.7*  --   --   ALBUMIN 3.6 3.0*  --  1.7*  --   --   AST 11* 17  --  51*  --   --   ALT 15 17  --  40  --   --   ALKPHOS 70 61  --  50  --   --   BILITOT 0.4 0.7  --  0.3  --   --   BILIDIR  --   --   --  <0.1  --   --   IBILI  --   --   --  NOT CALCULATED  --   --    < > = values in this interval not displayed.    Studies Reviewed:  CT CHEST ABDOMEN PELVIS W CONTRAST Result Date: 10/26/2023 CLINICAL DATA:  Sepsis. EXAM: CT CHEST, ABDOMEN, AND PELVIS WITH CONTRAST TECHNIQUE: Multidetector CT imaging of the chest, abdomen and pelvis was performed following the standard protocol during bolus administration of intravenous contrast. RADIATION DOSE REDUCTION: This exam was performed according to the departmental dose-optimization program which includes automated exposure control, adjustment of the mA and/or kV according to patient size and/or use of iterative reconstruction technique. CONTRAST:  80mL OMNIPAQUE  IOHEXOL  300 MG/ML  SOLN COMPARISON:  10/22/2023. FINDINGS: CT CHEST FINDINGS Cardiovascular: The heart is normal in size and there is a small pericardial effusion. There is atherosclerotic calcification of the aorta without evidence of aneurysm. The pulmonary trunk is normal in caliber. Mediastinum/Nodes: No mediastinal, hilar, or axillary lymphadenopathy is seen. The thyroid gland and trachea are within normal limits. The esophagus is mildly distended with fluid. Lungs/Pleura: Paraseptal and centrilobular emphysematous changes are noted in the lungs. Scattered airspace disease and consolidation are present in the lungs bilaterally, involving right lower lobe, left lower lobe, and right upper lobe. No effusion or pneumothorax is seen. Musculoskeletal: Degenerative changes are present in the thoracic spine. No acute osseous abnormality is seen. CT ABDOMEN PELVIS FINDINGS Hepatobiliary: No focal liver abnormality is seen.  No gallstones, gallbladder wall thickening, or biliary dilatation. Pancreas: Unremarkable. No pancreatic ductal dilatation or surrounding inflammatory changes. Spleen: Normal in size without focal abnormality. Adrenals/Urinary Tract: The adrenal glands are within normal limits. The kidneys enhance symmetrically. Subcentimeter hypodensities are present in the kidneys bilaterally which are too small to further characterize. No renal calculus or hydronephrosis is seen. Diffuse bladder wall thickening is noted. Stomach/Bowel: The stomach is within normal limits. No bowel obstruction, free air, or pneumatosis is seen. The appendix is surgically absent. There is bowel wall thickening involving the rectum with surrounding inflammatory changes. Vascular/Lymphatic: Aortic atherosclerosis. No enlarged abdominal or pelvic lymph nodes. Reproductive: Status post hysterectomy. No adnexal masses. Other: There is a trace amount of  perisplenic free fluid. Edema and a small amount of free fluid are present in the presacral space. Musculoskeletal: Degenerative changes are present in the lumbar spine. No acute osseous abnormality is seen. IMPRESSION: 1. Patchy airspace disease and consolidation in right upper, right lower, and left lower lobes, concerning for pneumonia. 2. Diffuse bladder wall thickening suggesting infectious or inflammatory cystitis. 3. Rectal wall thickening with surrounding fat stranding and edema, compatible with proctitis. 4. Emphysema. 5. Aortic atherosclerosis and coronary artery calcifications. Electronically Signed   By: Leita Birmingham M.D.   On: 10/26/2023 14:17   CT Angio Chest Pulmonary Embolism (PE) W or WO Contrast Addendum Date: 10/22/2023 ADDENDUM REPORT: 10/22/2023 10:42 ADDENDUM: Critical Value/emergent results were called by telephone at the time of interpretation on 10/22/2023 at 10:32 a.m. to provider [facility] , who verbally acknowledged these results. Electronically Signed   By: Toribio Agreste M.D.   On: 10/22/2023 10:42   Result Date: 10/22/2023 CLINICAL DATA:  History of squamous cell anal carcinoma undergoing chemotherapy. Patient with confusion and possible fever. Suspect pulmonary embolism. EXAM: CT ANGIOGRAPHY CHEST WITH CONTRAST TECHNIQUE: Multidetector CT imaging of the chest was performed using the standard protocol during bolus administration of intravenous contrast. Multiplanar CT image reconstructions and MIPs were obtained to evaluate the vascular anatomy. RADIATION DOSE REDUCTION: This exam was performed according to the departmental dose-optimization program which includes automated exposure control, adjustment of the mA and/or kV according to patient size and/or use of iterative reconstruction technique. CONTRAST:  75mL OMNIPAQUE  IOHEXOL  350 MG/ML SOLN COMPARISON:  PET-CT 10/22/2023 FINDINGS: Cardiovascular: Mild cardiomegaly. Thoracic aorta is normal in caliber. Minimal calcified plaque over the descending thoracic aorta. Pulmonary arterial system is adequately opacified. Findings suggesting subtle small subsegmental embolus over the left lingular pulmonary artery as well as anterior branch right middle lobe pulmonary artery. Remaining vascular structures are unremarkable. Mediastinum/Nodes: No evidence of mediastinal or hilar adenopathy. Remaining mediastinal structures are unremarkable. Lungs/Pleura: Lungs are adequately inflated. Minimal posterior bibasilar dependent atelectasis. No significant effusion. Suggestion of mild centrilobular emphysematous disease. Airways are normal. Upper Abdomen: No acute findings. Musculoskeletal: No acute or concerning findings. Review of the MIP images confirms the above findings. IMPRESSION: 1. Findings suggesting subtle small subsegmental embolus over the left lingular pulmonary artery as well as anterior branch right middle lobe pulmonary artery. No evidence of right heart strain. 2. Mild cardiomegaly. 3. Suggestion of mild centrilobular  emphysematous disease. 4. Aortic atherosclerosis. Aortic Atherosclerosis (ICD10-I70.0) and Emphysema (ICD10-J43.9). Ordering physician has been paged. Electronically Signed: By: Toribio Agreste M.D. On: 10/22/2023 10:24   CT Head Wo Contrast Result Date: 10/22/2023 CLINICAL DATA:  68 year old female with altered mental status. Stage IV anal rectal cancer. EXAM: CT HEAD WITHOUT CONTRAST TECHNIQUE: Contiguous axial images were obtained from the base of the skull through the vertex without intravenous contrast. RADIATION DOSE REDUCTION: This exam was performed according to the departmental dose-optimization program which includes automated exposure control, adjustment of the mA and/or kV according to patient size and/or use of iterative reconstruction technique. COMPARISON:  Head CT 11/27/2014. FINDINGS: Brain: No midline shift, ventriculomegaly, mass effect, evidence of mass lesion, intracranial hemorrhage or evidence of cortically based acute infarction. Patchy mild for age bilateral cerebral white matter hypodensity. Otherwise maintained gray-white differentiation. No cortical encephalomalacia identified. Vascular: No suspicious intracranial vascular hyperdensity. Calcified atherosclerosis at the skull base. Skull: Intact.  No acute or suspicious osseous lesion identified. Sinuses/Orbits: Visualized paranasal sinuses and mastoids are stable and well aerated. Other: Visualized orbits and scalp  soft tissues are within normal limits. IMPRESSION: No acute intracranial abnormality by noncontrast CT. Mild for age cerebral white matter changes, most commonly due to chronic small vessel disease. Electronically Signed   By: VEAR Hurst M.D.   On: 10/22/2023 07:19   DG Chest 2 View Result Date: 10/22/2023 EXAM: 2 VIEW(S) XRAY OF THE CHEST 10/22/2023 04:41:00 AM COMPARISON: 10/08/2019 CLINICAL HISTORY: Fever. Chemo, fever. FINDINGS: LUNGS AND PLEURA: No focal pulmonary opacity. No pulmonary edema. No pleural effusion. No  pneumothorax. HEART AND MEDIASTINUM: No acute abnormality of the cardiac and mediastinal silhouettes. Aortic atherosclerotic calcification. BONES AND SOFT TISSUES: Remote fracture deformity involving the midshaft of right clavicle. No acute osseous abnormality. IMPRESSION: 1. No acute cardiopulmonary pathology. Electronically signed by: Waddell Calk MD 10/22/2023 05:46 AM EDT RP Workstation: [id number]   IR PICC PLACEMENT RIGHT >5 YRS INC IMG GUIDE Result Date: 10/13/2023 INDICATION: 68 year old female with history of anal cancer presents in need of durable venous access. Peripherally inserted central catheter requested. EXAM: PICC LINE PLACEMENT WITH ULTRASOUND AND FLUOROSCOPIC GUIDANCE MEDICATIONS: 5 mL 1% lidocaine  ANESTHESIA/SEDATION: None FLUOROSCOPY: Radiation Exposure Index (as provided by the fluoroscopic device): 1.0 mGy Kerma COMPLICATIONS: None immediate. PROCEDURE: The patient was advised of the possible risks and complications and agreed to undergo the procedure. The patient was then brought to the angiographic suite for the procedure. The right arm was prepped with chlorhexidine , draped in the usual sterile fashion using maximum barrier technique (cap and mask, sterile gown, sterile gloves, large sterile sheet, hand hygiene and cutaneous antisepsis) and infiltrated locally with 1% Lidocaine . Ultrasound demonstrated patency of the right basilic vein, and this was documented with an image. Under real-time ultrasound guidance, this vein was accessed with a 21 gauge micropuncture needle and image documentation was performed. A 0.018 wire was introduced in to the vein. Over this, a 5 [place] dual lumen power PICC was advanced to the lower SVC/right atrial junction. Fluoroscopy during the procedure and fluoro spot radiograph confirms appropriate catheter position. The catheter was flushed and covered with a sterile dressing. Catheter length: 34 IMPRESSION: Successful right arm Power, dual lumen PICC line  placement with ultrasound and fluoroscopic guidance. The catheter is ready for use. Performed by: Kacie Matthews PA-C Electronically Signed   By: Cordella Banner   On: 10/13/2023 11:13   Addendum I have seen the patient, examined her. I agree with the assessment and and plan and have edited the notes.   Patient is overall feeling better, she is trying to eat more, although she does not like hospital food.  She has decided to forego the last 2 doses of radiation, which is reasonable.  We have also decided to hold on more blood draw for lab, due to her anemia.  I plan to give her 1 more dose Retacrit  at 10K unit.  If she is still in hospital by next Monday, please repeat CBC to see if she needs more ESA.  Onita Mattock MD 10/30/2023

## 2023-10-29 NOTE — Plan of Care (Signed)
  Problem: Clinical Measurements: Goal: Respiratory complications will improve Outcome: Progressing Goal: Cardiovascular complication will be avoided Outcome: Progressing   Problem: Elimination: Goal: Will not experience complications related to bowel motility Outcome: Not Progressing   Problem: Pain Managment: Goal: General experience of comfort will improve and/or be controlled Outcome: Not Progressing

## 2023-10-30 ENCOUNTER — Ambulatory Visit: Payer: Medicare (Managed Care)

## 2023-10-30 ENCOUNTER — Other Ambulatory Visit: Payer: Self-pay

## 2023-10-30 DIAGNOSIS — G9341 Metabolic encephalopathy: Secondary | ICD-10-CM | POA: Diagnosis not present

## 2023-10-30 MED ORDER — GUAIFENESIN-DM 100-10 MG/5ML PO SYRP
5.0000 mL | ORAL_SOLUTION | ORAL | Status: DC | PRN
  Administered 2023-10-30 – 2023-10-31 (×4): 5 mL via ORAL
  Filled 2023-10-30 (×4): qty 10

## 2023-10-30 MED ORDER — EPOETIN ALFA-EPBX 10000 UNIT/ML IJ SOLN
10000.0000 [IU] | Freq: Once | INTRAMUSCULAR | Status: DC
  Filled 2023-10-30: qty 1

## 2023-10-30 NOTE — Progress Notes (Signed)
 PROGRESS NOTE    Misty [name]  FMW:[id number] DOB: 03-14-1956 DOA: 10/22/2023 PCP: Dorcus [name], MD   Brief Narrative:  Misty [name] is a 68 y.o. 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 TRH consulted for admission for further evaluation management of acute metabolic encephalopathy, acute pulmonary embolism, weakness, debility, uncontrolled pain of malignancy.  Patient currently medically stable for discharge, will forego the last 2 rounds of radiation given patient's anemia and intolerance. Otherwise we will continue physical therapy and will complete antibiotic course 8/7. Awaiting approval/decision on SNF given current needs and unsafe discharge home at this time given level of care required.  Assessment & Plan:   Principal Problem:   Encephalopathy, metabolic Active Problems:   Squamous cell carcinoma of anus (HCC)   Febrile neutropenia (HCC)   Refusal of blood product  Acute metabolic encephalopathy: Resolved Likely multifactorial in the setting of concurrent chemoradiation, multiple CNS depressants/opiates as well as fever with acutely noted PE. - CT head unremarkable for acute findings - UA within normal limits - Weaned narcotics given appropriate improvement in mental status  Febrile neutropenia Pneumonia Severe sepsis, not POA - Without clear source, CT chest abdomen pelvis with questionable pneumonia - Continue broad-spectrum antibiotics(cefepime  for 7-day course -to complete a 725) - Blood cultures x 2 7/30: No growth to date - Blood cultures x 2 8/3: No growth to date - Urine culture 7/31: No growth - Continue cefepime ; plan  7-day course   Profound symptomatic anemia  - Complicated by treatment given above as well as known active squamous cell carcinoma of the anus - Je[employer] unable to receive transfusion - Discontinue daily labs to avoid further iatrogenic anemia - Hemoglobin generally downtrending, oncology following status post filgrastim  and EPO - Resume iron at discharge - Eliquis  discontinued -would resume once patient's CBC improves - FOBT pending  Hypotension - Midodrine  to 10 mg p.o. 3 times daily   Acute pulmonary embolism -Holding Eliquis  given worsening anemia and thrombocytopenia   Hypokalemia Hypophosphatemia Hypomagnesemia - Likely secondary to poor p.o. intake, advance diet as tolerated - Hold further blood draws given anemia    Urinary retention Foley catheter placed by urology 2 days prior to admission.  CT abdomen/pelvis with diffuse bladder wall thickening, likely consistent with chronic bladder outlet obstruction. -- Continue Foley catheter, outpatient follow-up with urology   Squamous cell carcinoma anus - Medical oncology following, appreciate assistance - Radiation course stopped   Pain of malignancy - Improving on current regimen, MS contin , oxycodone  IR, morphine  IV - Attempted transition off of IV narcotics over the next 24 hours - Gabapentin  reduced to 300 mg p.o. twice daily - Continue home baclofen  10 mg p.o. 3 times daily as needed muscle spasms (not utilizing over past 48h)  Hypothyroidism - Levothyroxine  25 mcg p.o. daily   Hyperlipidemia - Atorvastatin  20 mg p.o. daily   Anxiety/depression - Bupropion  450 mg p.o. every morning - Valium  5 mg p.o. twice daily as needed anxiety   COPD, not in acute exacerbation - Breztri  2 puffs twice daily - Singulair  10 mg p.o. nightly - Albuterol  neb every 2 hours.  Wheezing/shortness of breath   Mucositis - Magic mouthwash   Insomnia - Trazodone  25 mg p.o. nightly as needed  sleep   Constipation -  Senokot S/MiraLAX  daily - Avoid rectal suppositories given rectal pain with history of anal cancer - Strict I's and O's, monitor bowel movements closely   Weakness/ambulatory dysfunction/deconditioning/gait disturbance: -- PT/OT recommend SNF placement -- TOC consulted for placement  DVT prophylaxis: Chemical prophylaxis on hold due to profound anemia/thrombocytopenia Code Status:   Code Status: Limited: Do not attempt resuscitation (DNR) -DNR-LIMITED -Do Not Intubate/DNI   Family Communication: Daughter at bedside  Status is: Inpatient  Dispo: The patient is from: Home              Anticipated d/c is to: To be determined              Anticipated d/c date is: 24 to 48 hours              Patient currently not medically stable for discharge  Consultants:  Oncology, radiation oncology  Procedures:  As above  Antimicrobials:  Cefepime  x 7 days  Subjective: Acute issues or events overnight denies nausea vomiting diarrhea headache fevers chills or chest pain  Objective: Vitals:   10/30/23 0300 10/30/23 0400 10/30/23 0500 10/30/23 0600  BP: (!) 117/40 (!) 124/58 (!) 108/37 (!) 104/34  Pulse: 78 96 81 78  Resp: 18 (!) 21 19 17   Temp:  98.4 F (36.9 C)    TempSrc:  Oral    SpO2: 95% 96% 92% 94%  Weight:      Height:        Intake/Output Summary (Last 24 hours) at 10/30/2023 0732 Last data filed at 10/30/2023 0718 Gross per 24 hour  Intake 394.84 ml  Output 2075 ml  Net -1680.16 ml   Filed Weights   10/22/23 1629 10/26/23 1324  Weight: 72 kg 72.5 kg    Examination:  General exam: Appears calm and comfortable  Respiratory system: Clear to auscultation. Respiratory effort normal. Cardiovascular system: S1 & S2 heard, RRR. No JVD, murmurs, rubs, gallops or clicks. No pedal edema. Gastrointestinal system: Abdomen is nondistended, soft and nontender. No organomegaly or masses felt. Normal bowel sounds heard. Central nervous system: Alert and oriented. No focal  neurological deficits. Extremities: Symmetric 5 x 5 power. Skin: No rashes, lesions or ulcers Psychiatry: Judgement and insight appear normal. Mood & affect appropriate.     Data Reviewed: I have personally reviewed following labs and imaging studies  CBC: Recent Labs  Lab 10/25/23 0713 10/26/23 0647 10/27/23 0307 10/28/23 0320 10/29/23 0316  WBC 0.5* 0.3* 1.2* 4.9 5.2  NEUTROABS  --   --   --  4.0  --   HGB 6.7* 5.8* 5.4* 6.1* 5.8*  HCT 21.8* 18.5* 17.5* 19.6* 18.6*  MCV 100.9* 97.9 97.8 100.5* 101.1*  PLT 80* 66* 57* 79* 82*   Basic Metabolic Panel: Recent Labs  Lab 10/24/23 0556 10/25/23 0713 10/26/23 0647 10/27/23 0307 10/28/23 0320 10/29/23 0316  NA 131* 129* 131* 136 133* 136  K 3.5 4.3 3.1* 4.0 3.6 3.3*  CL 105 107 103 108 106 108  CO2 19* 18* 19* 19* 17* 18*  GLUCOSE 135* 98 124* 102* 104* 102*  BUN 10 11 10 11 11 11   CREATININE 0.98 0.91 0.84 0.88 0.84 0.76  CALCIUM  7.8* 7.7* 8.0* 8.2* 8.5* 8.3*  MG 2.0  --   --   --  1.8 2.2  PHOS  --   --   --   --  2.0* 2.6   GFR: Estimated Creatinine Clearance: 65.1 mL/min (by C-G formula based on  SCr of 0.76 mg/dL). Liver Function Tests: Recent Labs  Lab 10/27/23 0307  AST 51*  ALT 40  ALKPHOS 50  BILITOT 0.3  PROT 4.7*  ALBUMIN 1.7*   No results for input(s): LIPASE, AMYLASE in the last 168 hours. No results for input(s): AMMONIA in the last 168 hours. Coagulation Profile: Recent Labs  Lab 10/24/23 1105  INR 1.9*   Cardiac Enzymes: No results for input(s): CKTOTAL, CKMB, CKMBINDEX, TROPONINI in the last 168 hours. BNP (last 3 results) No results for input(s): PROBNP in the last 8760 hours. HbA1C: No results for input(s): HGBA1C in the last 72 hours. CBG: No results for input(s): GLUCAP in the last 168 hours. Lipid Profile: No results for input(s): CHOL, HDL, LDLCALC, TRIG, CHOLHDL, LDLDIRECT in the last 72 hours. Thyroid Function Tests: No results for input(s):  TSH, T4TOTAL, FREET4, T3FREE, THYROIDAB in the last 72 hours. Anemia Panel: No results for input(s): VITAMINB12, FOLATE, FERRITIN, TIBC, IRON, RETICCTPCT in the last 72 hours. Sepsis Labs: Recent Labs  Lab 10/27/23 0307 10/28/23 0320  PROCALCITON 3.89 2.30    Recent Results (from the past 240 hours)  Blood Culture (routine x 2)     Status: None   Collection Time: 10/22/23  4:05 AM   Specimen: BLOOD  Result Value Ref Range Status   Specimen Description   Final    BLOOD RIGHT ANTECUBITAL Performed at [facility], [address] [place], [place] [address]    Special Requests   Final    BOTTLES DRAWN AEROBIC AND ANAEROBIC Blood Culture results may not be optimal due to an inadequate volume of blood received in culture bottles Performed at [facility], [address], [place], [place] [address]    Culture   Final    NO GROWTH 5 DAYS Performed at [facility], [address], [place], [place] [address]    Report Status 10/27/2023 FINAL  Final  Blood Culture (routine x 2)     Status: None   Collection Time: 10/22/23  5:25 AM   Specimen: BLOOD LEFT WRIST  Result Value Ref Range Status   Specimen Description   Final    BLOOD LEFT WRIST Performed at [facility], [address] [place], [place] [address]    Special Requests   Final    BOTTLES DRAWN AEROBIC AND ANAEROBIC Blood Culture results may not be optimal due to an inadequate volume of blood received in culture bottles Performed at [facility], [address] [place], [place] [address]    Culture   Final    NO GROWTH 5 DAYS Performed at [facility], [address], [place], [place] [address]    Report Status 10/27/2023 FINAL  Final  Urine Culture (for pregnant, neutropenic or urologic patients or patients with an indwelling urinary catheter)     Status: None   Collection Time: 10/23/23  7:20 AM   Specimen: Urine,  Catheterized  Result Value Ref Range Status   Specimen Description   Final    URINE, CATHETERIZED Performed at [facility], [address], [place], [place] [address]    Special Requests   Final    NONE Performed at [facility], [address] [place], [place] [address]    Culture   Final    NO GROWTH Performed at [facility], [address] [place], [place] [address]    Report Status 10/24/2023 FINAL  Final  MRSA Next Gen by PCR, Nasal     Status: None   Collection Time: 10/26/23 11:32 AM   Specimen: Nasal Mucosa; Nasal Swab  Result Value Ref Range Status   MRSA by PCR Next Gen NOT DETECTED NOT DETECTED Final    Comment: (NOTE) The GeneXpert MRSA Assay (FDA approved for NASAL specimens only), is one component of a comprehensive MRSA colonization surveillance program. It is not intended to diagnose MRSA infection nor to guide or monitor treatment for MRSA infections. Test performance is not FDA approved in patients less than 48 years old. Performed at [facility], [address] [place], [place] [address]   Culture, blood (Routine X 2) w Reflex to ID Panel     Status: None (Preliminary result)   Collection Time: 10/26/23  3:33 PM   Specimen: BLOOD RIGHT HAND  Result Value Ref Range Status   Specimen Description   Final    BLOOD RIGHT HAND Performed at [facility], [address] [place], [place] [address]    Special Requests   Final    BOTTLES DRAWN AEROBIC AND ANAEROBIC Blood Culture results may not be optimal due to an inadequate volume of blood received in culture bottles Performed at [facility], [address], [place], [place] [address]    Culture   Final    NO GROWTH 3 DAYS Performed at [facility], [address] [place], [place] [address]    Report Status PENDING  Incomplete  Culture, blood (Routine X 2) w Reflex to ID Panel     Status: None (Preliminary  result)   Collection Time: 10/26/23  3:33 PM   Specimen: BLOOD RIGHT HAND  Result Value Ref Range Status   Specimen Description   Final    BLOOD RIGHT HAND Performed at [facility], [address], [place], [place] [address]    Special Requests   Final    BOTTLES DRAWN AEROBIC AND ANAEROBIC Blood Culture results may not be optimal due to an inadequate volume of blood received in culture bottles Performed at [facility], [address], [place], [place] [address]    Culture   Final    NO GROWTH 3 DAYS Performed at [facility], [address], [place], [place] [address]    Report Status PENDING  Incomplete         Radiology Studies: No results found.      Scheduled Meds:  atorvastatin   20 mg Oral Daily   budesonide -glycopyrrolate -formoterol   2 puff Inhalation BID   buPROPion   450 mg Oral q morning   Chlorhexidine  Gluconate Cloth  6 each Topical Daily   feeding supplement  237 mL Oral BID BM   gabapentin   300 mg Oral BID   lamoTRIgine   200 mg Oral QHS   levothyroxine   75 mcg Oral Q0600   liver oil-zinc  oxide   Topical BID   midodrine   10 mg Oral TID WC   montelukast   10 mg Oral QHS   morphine   15 mg Oral Q12H   mouth rinse  15 mL Mouth Rinse 4 times per day   prenatal multivitamin  1 tablet Oral Q1200   silver  sulfADIAZINE    Topical BID   valACYclovir   500 mg Oral Daily   Continuous Infusions:  ceFEPime  (MAXIPIME ) IV Stopped (10/30/23 0655)   promethazine  (PHENERGAN ) injection (IM or IVPB) Stopped (10/28/23 2347)     LOS: 6 days   Time  spent:  Elsie JAYSON [place], DO [facility]  If 7PM-7AM, please contact night-coverage [email or web address]  10/30/2023, 7:32 AM

## 2023-10-30 NOTE — Plan of Care (Signed)
  Problem: Education: Goal: Knowledge of General Education information will improve Description: Including pain rating scale, medication(s)/side effects and non-pharmacologic comfort measures Outcome: Progressing   Problem: Clinical Measurements: Goal: Cardiovascular complication will be avoided Outcome: Progressing   Problem: Pain Managment: Goal: General experience of comfort will improve and/or be controlled Outcome: Progressing   Problem: Clinical Measurements: Goal: Respiratory complications will improve Outcome: Not Progressing

## 2023-10-30 NOTE — Progress Notes (Signed)
  Radiation Oncology         [phone] ________________________________  Name: Misty [name] MRN: [id number]  Date: 10/29/2023  DOB: Sep 18, 1955  End of Treatment Note  Diagnosis: Squamous Cell Carcinoma of the Anus  Indication for treatment:  Curative       Radiation treatment dates:   09/15/23-10/24/23  Site/dose:   The patient was treated with a course of IMRT using a simultaneous integrated boost technique. Daily image guidance was using during the treatment. The high dose region received a total of 50.4 Gy in 28 fractions of the planned 30 fractions that would have totaled 54 Gy.   Narrative: The patient tolerated radiation treatment though struggled with pancytopenia and was admitted due to sepsis and pneumonia requiring hospitalization. She also developed experienced skin irritation as expected by the end of treatment.    Plan: The patient discontinued therapy after 28 fractions due to desquamation. The patient will receive a call in about one month from the radiation oncology department. She will continue follow up with Dr. Lanny as well as Dr. [name].     Donald KYM Husband, PAC

## 2023-10-30 NOTE — Progress Notes (Addendum)
 Occupational Therapy Treatment Patient Details Name: Misty [name] MRN: [id number] DOB: [birth year] Today's Date: 10/30/2023   History of present illness Misty [name] is a 68 y.o. female presents with worsening of anal pain, fecal incontinence, confusion and development of fever. PMH: COPD, bipolar disorder, hypothyroidism, squamous cell carcinoma of the anus undergoing concurrent chemoradiation   OT comments  Patient seen for skilled OT this am. Open to all therapy presented including light UE HEP with pacing and breathing education and integration, OOB to commode and to recliner with RW with status as outlined below. Patient requires continued Acute care hospital level OT services to progress safety and functional performance and allow for discharge. Patient will benefit from continued inpatient follow up therapy, <3 hours/day.    VSR: pre: BP 123/51; 97% on 2 ltrs of O2 via [place], HR 82 RR 18 post: BP 136/60 99% on 2 ltrs O2 via [place], HR 88 RR 22       If plan is discharge home, recommend the following:  Assistance with cooking/housework;Assist for transportation;A little help with bathing/dressing/bathroom;A little help with walking and/or transfers   Equipment Recommendations  None recommended by OT       Precautions / Restrictions Precautions Precautions: Fall Precaution/Restrictions Comments: concurrent chemoradiation, very low hgb but ok to mobilize OOB Restrictions Weight Bearing Restrictions Per Provider Order: No       Mobility Bed Mobility Overal bed mobility: Needs Assistance Bed Mobility: Supine to Sit     Supine to sit: Contact guard          Transfers Overall transfer level: Needs assistance Equipment used: Rolling walker (2 wheels) Transfers: Sit to/from Stand, Bed to chair/wheelchair/BSC Sit to Stand: Min assist     Step pivot transfers: Min assist, Contact guard assist     General transfer comment: cues for pacing and lines awareness  with min cues for hand placement     Balance Overall balance assessment: Needs assistance Sitting-balance support: Feet supported, Bilateral upper extremity supported Sitting balance-Leahy Scale: Good Sitting balance - Comments: static sitting-good. dynamic sitting-fair (limited by buttocks pain and guarding)   Standing balance support: During functional activity, Single extremity supported Standing balance-Leahy Scale: Poor Standing balance comment: mn assist to steady                           ADL either performed or assessed with clinical judgement   ADL Overall ADL's : Needs assistance/impaired Eating/Feeding: Independent;Sitting   Grooming: Wash/dry hands;Wash/dry face;Oral care;Set up;Sitting               Lower Body Dressing: Minimal assistance;Sit to/from stand Lower Body Dressing Details (indicate cue type and reason): chagning mesh panties and pads with RW for unilateral steadying Toilet Transfer: Contact guard assist;Rolling walker (2 wheels);Ambulation;BSC/3in1   Toileting- Clothing Manipulation and Hygiene: Contact guard assist;Minimal assistance;Sit to/from stand       Functional mobility during ADLs: Contact guard assist;Rolling walker (2 wheels) General ADL Comments: cues for pacing and breathing integration focus with cues to maintain brief silence to achieve respiratory goals    Extremity/Trunk Assessment Upper Extremity Assessment Upper Extremity Assessment: Overall WFL for tasks assessed;Right hand dominant   Lower Extremity Assessment Lower Extremity Assessment: Defer to PT evaluation                 Communication Communication Communication: No apparent difficulties   Cognition Arousal: Alert Behavior During Therapy: [facility] for tasks assessed/performed  OT - Cognition Comments: Oriented x4, use of written log for events of hospitalization                 Following commands: Intact        Cueing    Cueing Techniques: Verbal cues  Exercises Exercises: Other exercises (Lv 1 yellow tband for triceps press 10 reps x 2 sets Bly, issued and trained in foam squeeze ball for integration with breathing)    Shoulder Instructions       General Comments pt on 2 ltrs due to earlier coughing, see above for VSR    Pertinent Vitals/ Pain       Pain Assessment Pain Assessment: 0-10 Pain Score: 6  Pain Location: bottom Pain Descriptors / Indicators: Discomfort, Grimacing, Guarding Pain Intervention(s): Monitored during session, Premedicated before session, Repositioned, Relaxation   Frequency  Min 2X/week        Progress Toward Goals  OT Goals(current goals can now be found in the care plan section)  Progress towards OT goals: Progressing toward goals  Acute Rehab OT Goals Patient Stated Goal: to have easier time with toileting OT Goal Formulation: With patient Time For Goal Achievement: 11/06/23 Potential to Achieve Goals: Good ADL Goals Pt Will Perform Grooming: with modified independence;standing Pt Will Perform Lower Body Dressing: with modified independence;sit to/from stand;sitting/lateral leans Pt Will Transfer to Toilet: with modified independence;ambulating Pt Will Perform Toileting - Clothing Manipulation and hygiene: with modified independence;sit to/from stand   AM-PAC OT 6 Clicks Daily Activity     Outcome Measure   Help from another person eating meals?: None Help from another person taking care of personal grooming?: A Little Help from another person toileting, which includes using toliet, bedpan, or urinal?: A Little Help from another person bathing (including washing, rinsing, drying)?: A Little Help from another person to put on and taking off regular upper body clothing?: A Little Help from another person to put on and taking off regular lower body clothing?: A Little 6 Click Score: 19    End of Session Equipment Utilized During Treatment: Gait  belt;Rolling walker (2 wheels);Oxygen  OT Visit Diagnosis: Unsteadiness on feet (R26.81);Pain;Muscle weakness (generalized) (M62.81) Pain - part of body:  (rectal)   Activity Tolerance Patient tolerated treatment well   Patient Left in chair;with call bell/phone within reach;with family/visitor present   Nurse Communication Mobility status;Other (comment) (nursing ensured ok to leave up with call button and MD in chair after session)        Time: 1045-1130 OT Time Calculation (min): 45 min  Charges: OT General Charges $OT Visit: 1 Visit OT Treatments $Self Care/Home Management : 23-37 mins $Therapeutic Exercise: 8-22 mins Takeria Marquina OT/L Acute Rehabilitation Department  [phone]  10/30/2023, 11:40 AM

## 2023-10-30 NOTE — Plan of Care (Signed)
   Problem: Activity: Goal: Risk for activity intolerance will decrease Outcome: Progressing   Problem: Nutrition: Goal: Adequate nutrition will be maintained Outcome: Progressing   Problem: Coping: Goal: Level of anxiety will decrease Outcome: Progressing   Problem: Skin Integrity: Goal: Risk for impaired skin integrity will decrease Outcome: Progressing

## 2023-10-31 ENCOUNTER — Ambulatory Visit: Payer: Medicare (Managed Care)

## 2023-10-31 DIAGNOSIS — G9341 Metabolic encephalopathy: Secondary | ICD-10-CM | POA: Diagnosis not present

## 2023-10-31 LAB — CULTURE, BLOOD (ROUTINE X 2)
Culture: NO GROWTH
Culture: NO GROWTH

## 2023-10-31 MED ORDER — EPOETIN ALFA 10000 UNIT/ML IJ SOLN
10000.0000 [IU] | Freq: Once | INTRAMUSCULAR | Status: AC
  Administered 2023-10-31: 10000 [IU] via SUBCUTANEOUS
  Filled 2023-10-31: qty 1

## 2023-10-31 MED ORDER — EPOETIN ALFA 10000 UNIT/ML IJ SOLN
10000.0000 [IU] | Freq: Once | INTRAMUSCULAR | Status: DC
  Filled 2023-10-31: qty 1

## 2023-10-31 NOTE — Plan of Care (Signed)

## 2023-10-31 NOTE — Progress Notes (Signed)
 Physical Therapy Treatment Patient Details Name: Misty [name] MRN: [id number] DOB: [birth year] Today's Date: 10/31/2023   History of Present Illness Misty [name] is a 68 y.o. female presents on 10/22/23 with worsening of anal pain, fecal incontinence, confusion and development of fever. Admitted with acute metabolic encephalopathy, PNE, sepsis, symptomatic anemia (Jehovah's witness unable to receive transfusion), PE, hypokalemia.  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  Last Hgb was 5.8 but no plans to transfuse.  Pt tolerating therapy well with rest breaks and with all VSS on RA. She ambulated 20'x2 in room with min A. Continue POC.  Continue to recommend Patient will benefit from continued inpatient follow up therapy, <3 hours/day    If plan is discharge home, recommend the following: A little help with walking and/or transfers;A little help with bathing/dressing/bathroom;Assistance with cooking/housework;Help with stairs or ramp for entrance   Can travel by private vehicle     No  Equipment Recommendations  Rolling walker (2 wheels)    Recommendations for Other Services       Precautions / Restrictions Precautions Precautions: Fall Precaution/Restrictions Comments: concurrent chemoradiation, very low hgb but ok to mobilize OOB     Mobility  Bed Mobility Overal bed mobility: Needs Assistance, Independent Bed Mobility: Supine to Sit     Supine to sit: Contact guard          Transfers Overall transfer level: Needs assistance Equipment used: Rolling walker (2 wheels) Transfers: Sit to/from Stand Sit to Stand: Min assist           General transfer comment: light min A; STS x 4 during session    Ambulation/Gait Ambulation/Gait assistance: Min assist Gait Distance (Feet): 20 Feet (20'x2) Assistive device: Rolling walker  (2 wheels) Gait Pattern/deviations: Step-to pattern, Decreased stride length Gait velocity: decreased     General Gait Details: light min A; ambulated to door and back x 2 with RW; seated rest break   Stairs             Wheelchair Mobility     Tilt Bed    Modified Rankin (Stroke Patients Only)       Balance Overall balance assessment: Needs assistance Sitting-balance support: Feet supported, No upper extremity supported Sitting balance-Leahy Scale: Good     Standing balance support: Bilateral upper extremity supported, Reliant on assistive device for balance Standing balance-Leahy Scale: Poor                              Communication    Cognition                                        Cueing    Exercises General Exercises - Lower Extremity Ankle Circles/Pumps: AROM, Both, 10 reps, Seated Long Arc Quad: AROM, Both, 10 reps, Seated Hip ABduction/ADduction: Strengthening, Both, 10 reps, Seated (clamshell pillow squeeze) Hip Flexion/Marching: AROM, Both, 10 reps, Seated    General Comments General comments (skin integrity, edema, etc.): Pt's VSS throughout session on RA  HR 95-109 bpm; sats 94-96% on RA; BP 130's-140's/70's       Pertinent Vitals/Pain Pain Assessment Pain Assessment: 0-10 Pain Score: 3  Pain Location: bottom Pain Descriptors / Indicators: Discomfort, Grimacing, Guarding Pain Intervention(s): Limited activity  within patient's tolerance, Monitored during session, Repositioned, Other (comment) (pressure relief pad)    Home Living                          Prior Function            PT Goals (current goals can now be found in the care plan section) Progress towards PT goals: Progressing toward goals    Frequency    Min 2X/week      PT Plan      Co-evaluation              AM-PAC PT 6 Clicks Mobility   Outcome Measure  Help needed turning from your back to your side while in a  flat bed without using bedrails?: A Little Help needed moving from lying on your back to sitting on the side of a flat bed without using bedrails?: A Little Help needed moving to and from a bed to a chair (including a wheelchair)?: A Little Help needed standing up from a chair using your arms (e.g., wheelchair or bedside chair)?: A Little Help needed to walk in hospital room?: A Lot (< distance) Help needed climbing 3-5 steps with a railing? : A Lot 6 Click Score: 16    End of Session Equipment Utilized During Treatment: Gait belt Activity Tolerance: Patient tolerated treatment well Patient left: in chair;with call bell/phone within reach;with family/visitor present Nurse Communication: Mobility status PT Visit Diagnosis: Unsteadiness on feet (R26.81);Muscle weakness (generalized) (M62.81);Pain     Time: 8543-8472 PT Time Calculation (min) (ACUTE ONLY): 31 min  Charges:    $Gait Training: 8-22 mins $Therapeutic Exercise: 8-22 mins PT General Charges $$ ACUTE PT VISIT: 1 Visit                     Benjiman, PT Acute Rehab [facility] [phone]    Benjiman VEAR Mulberry 10/31/2023, 3:59 PM

## 2023-10-31 NOTE — Radiation Completion Notes (Signed)
 Patient Name: Misty [name], Misty [name] MRN: [id number] Date of Birth: Aug 03, 1955 Referring Physician: ONITA MATTOCK, M.D. Date of Service: 2023-10-31 Radiation Oncologist: Norleen Limes, M.D. [facility]                             RADIATION ONCOLOGY END OF TREATMENT NOTE     Diagnosis: C21.0 Malignant neoplasm of anus, unspecified Staging on 2023-09-09: Squamous cell carcinoma of anus (HCC) T=cT3, N=cN1a, M=cM0 Intent: Curative     ==========DELIVERED PLANS==========  First Treatment Date: 2023-09-12 Last Treatment Date: 2023-10-24   Plan Name: Anus Site: Anus Technique: IMRT Mode: Photon Dose Per Fraction: 1.8 Gy Prescribed Dose (Delivered / Prescribed): 50.4 Gy / 54 Gy Prescribed Fxs (Delivered / Prescribed): 28 / 30     ==========ON TREATMENT VISIT DATES========== 2023-09-19, 2023-09-25, 2023-10-03, 2023-10-10, 2023-10-17, 2023-10-24     ==========UPCOMING VISITS========== 12/01/2023 [facility]RADIATION ONC POST TREATMENT CALL [facility]POST TREATMENT  11/25/2023 [facility] Donah Darryle RAMAN, PT        ==========APPENDIX - ON TREATMENT VISIT NOTES==========   See weekly On Treatment Notes in Epic for details in the Media tab (listed as Progress notes on the On Treatment Visit Dates listed above).

## 2023-10-31 NOTE — Progress Notes (Signed)
 PROGRESS NOTE    Misty [name]  FMW:[id number] DOB: [birth year] DOA: 10/22/2023 PCP: Dorcus [name], MD   Brief Narrative:  Misty [name] is a 68 y.o. female with past medical history significant for squamous cell carcinoma anus currently undergoing concurrent chemoradiation, cancer related pain, urinary retention s/p Foley catheter placement (urology 2 days prior to admission), COPD, hypothyroidism, anxiety/depression who presented to [facility] ED on 10/22/2023 from home via EMS with confusion, worsening rectal pain, fever. TRH consulted for admission for further evaluation management of acute metabolic encephalopathy, acute pulmonary embolism, weakness, debility, uncontrolled pain of malignancy.  Patient currently medically stable for discharge, will forego the last 2 rounds of radiation given patient's anemia and intolerance. Otherwise we will continue physical therapy and will complete antibiotic course 8/7. Awaiting approval/decision on SNF given current needs and unsafe discharge home at this time given level of care required.  Assessment & Plan:   Principal Problem:   Encephalopathy, metabolic Active Problems:   Squamous cell carcinoma of anus (HCC)   Febrile neutropenia (HCC)   Refusal of blood product  Acute metabolic encephalopathy: Resolved Likely multifactorial in the setting of concurrent chemoradiation, multiple CNS depressants/opiates as well as fever with acutely noted PE. - CT head unremarkable for acute findings - UA within normal limits - Weaned narcotics given appropriate improvement in mental status  Febrile neutropenia Pneumonia Severe sepsis, not POA - Without clear source, CT chest abdomen pelvis with questionable pneumonia - Continue broad-spectrum antibiotics(cefepime  for 7-day course -to complete a 725) - Blood cultures x 2 7/30: No growth to date - Blood cultures x 2 8/3: No growth to date - Urine culture 7/31: No growth - Continue cefepime ; plan  7-day course   Profound symptomatic anemia  - Complicated by treatment given above as well as known active squamous cell carcinoma of the anus - Jehovah's Witness unable to receive transfusion - Discontinue daily labs to avoid further iatrogenic anemia - Hemoglobin generally downtrending, oncology following status post filgrastim  and EPO - Resume iron at discharge - Eliquis  discontinued -would resume once patient's CBC improves - FOBT pending  Hypotension - Midodrine  to 10 mg p.o. 3 times daily   Acute pulmonary embolism -Holding Eliquis  given worsening anemia and thrombocytopenia   Hypokalemia Hypophosphatemia Hypomagnesemia - Likely secondary to poor p.o. intake, advance diet as tolerated - Hold further blood draws given anemia    Urinary retention Foley catheter placed by urology 2 days prior to admission.  CT abdomen/pelvis with diffuse bladder wall thickening, likely consistent with chronic bladder outlet obstruction. -- Continue Foley catheter, outpatient follow-up with urology   Squamous cell carcinoma anus - Medical oncology following, appreciate assistance - Radiation course stopped   Pain of malignancy - Improving on current regimen, MS contin , oxycodone  IR, morphine  IV - Attempted transition off of IV narcotics over the next 24 hours - Gabapentin  reduced to 300 mg p.o. twice daily - Continue home baclofen  10 mg p.o. 3 times daily as needed muscle spasms (not utilizing over past 48h)  Hypothyroidism - Levothyroxine  25 mcg p.o. daily   Hyperlipidemia - Atorvastatin  20 mg p.o. daily   Anxiety/depression - Bupropion  450 mg p.o. every morning - Valium  5 mg p.o. twice daily as needed anxiety   COPD, not in acute exacerbation - Breztri  2 puffs twice daily - Singulair  10 mg p.o. nightly - Albuterol  neb every 2 hours.  Wheezing/shortness of breath   Mucositis - Magic mouthwash   Insomnia - Trazodone  25 mg p.o. nightly as needed  sleep   Constipation -  Senokot S/MiraLAX  daily - Avoid rectal suppositories given rectal pain with history of anal cancer - Strict I's and O's, monitor bowel movements closely   Weakness/ambulatory dysfunction/deconditioning/gait disturbance: -- PT/OT recommend SNF placement -- TOC consulted for placement  DVT prophylaxis: Chemical prophylaxis on hold due to profound anemia/thrombocytopenia Code Status:   Code Status: Limited: Do not attempt resuscitation (DNR) -DNR-LIMITED -Do Not Intubate/DNI   Family Communication: Daughter at bedside  Status is: Inpatient  Dispo: The patient is from: Home              Anticipated d/c is to: To be determined              Anticipated d/c date is: 24 to 48 hours              Patient currently not medically stable for discharge  Consultants:  Oncology, radiation oncology  Procedures:  As above  Antimicrobials:  Cefepime  x 7 days  Subjective: Acute issues or events overnight denies nausea vomiting diarrhea headache fevers chills or chest pain  Objective: Vitals:   10/31/23 0300 10/31/23 0400 10/31/23 0500 10/31/23 0600  BP: (!) 124/44 121/76 (!) 114/37 (!) 115/43  Pulse: 81 88 79 80  Resp: 18 18 19 19   Temp:  98.7 F (37.1 C)    TempSrc:  Oral    SpO2: 91% 100% 91% 90%  Weight:      Height:        Intake/Output Summary (Last 24 hours) at 10/31/2023 0804 Last data filed at 10/31/2023 0510 Gross per 24 hour  Intake 370 ml  Output 3400 ml  Net -3030 ml   Filed Weights   10/22/23 1629 10/26/23 1324  Weight: 72 kg 72.5 kg    Examination:  General:  Pleasantly resting in bed, No acute distress. HEENT:  Normocephalic atraumatic.  Sclerae nonicteric, noninjected.  Extraocular movements intact bilaterally. Neck:  Without mass or deformity.  Trachea is midline. Lungs:  Clear to auscultate bilaterally without rhonchi, wheeze, or rales. Heart:  Regular rate and rhythm.  Without murmurs, rubs, or gallops. Abdomen:  Soft, nontender, nondistended.  Without  guarding or rebound. Extremities: Without cyanosis, clubbing, edema, or obvious deformity. Skin:  Warm and dry, no erythema.   Data Reviewed: I have personally reviewed following labs and imaging studies  CBC: Recent Labs  Lab 10/25/23 0713 10/26/23 0647 10/27/23 0307 10/28/23 0320 10/29/23 0316  WBC 0.5* 0.3* 1.2* 4.9 5.2  NEUTROABS  --   --   --  4.0  --   HGB 6.7* 5.8* 5.4* 6.1* 5.8*  HCT 21.8* 18.5* 17.5* 19.6* 18.6*  MCV 100.9* 97.9 97.8 100.5* 101.1*  PLT 80* 66* 57* 79* 82*   Basic Metabolic Panel: Recent Labs  Lab 10/25/23 0713 10/26/23 0647 10/27/23 0307 10/28/23 0320 10/29/23 0316  NA 129* 131* 136 133* 136  K 4.3 3.1* 4.0 3.6 3.3*  CL 107 103 108 106 108  CO2 18* 19* 19* 17* 18*  GLUCOSE 98 124* 102* 104* 102*  BUN 11 10 11 11 11   CREATININE 0.91 0.84 0.88 0.84 0.76  CALCIUM  7.7* 8.0* 8.2* 8.5* 8.3*  MG  --   --   --  1.8 2.2  PHOS  --   --   --  2.0* 2.6   GFR: Estimated Creatinine Clearance: 65.1 mL/min (by C-G formula based on SCr of 0.76 mg/dL). Liver Function Tests: Recent Labs  Lab 10/27/23 0307  AST 51*  ALT 40  ALKPHOS 50  BILITOT 0.3  PROT 4.7*  ALBUMIN 1.7*   No results for input(s): LIPASE, AMYLASE in the last 168 hours. No results for input(s): AMMONIA in the last 168 hours. Coagulation Profile: Recent Labs  Lab 10/24/23 1105  INR 1.9*   Cardiac Enzymes: No results for input(s): CKTOTAL, CKMB, CKMBINDEX, TROPONINI in the last 168 hours. BNP (last 3 results) No results for input(s): PROBNP in the last 8760 hours. HbA1C: No results for input(s): HGBA1C in the last 72 hours. CBG: No results for input(s): GLUCAP in the last 168 hours. Lipid Profile: No results for input(s): CHOL, HDL, LDLCALC, TRIG, CHOLHDL, LDLDIRECT in the last 72 hours. Thyroid Function Tests: No results for input(s): TSH, T4TOTAL, FREET4, T3FREE, THYROIDAB in the last 72 hours. Anemia Panel: No results for  input(s): VITAMINB12, FOLATE, FERRITIN, TIBC, IRON, RETICCTPCT in the last 72 hours. Sepsis Labs: Recent Labs  Lab 10/27/23 0307 10/28/23 0320  PROCALCITON 3.89 2.30    Recent Results (from the past 240 hours)  Blood Culture (routine x 2)     Status: None   Collection Time: 10/22/23  4:05 AM   Specimen: BLOOD  Result Value Ref Range Status   Specimen Description   Final    BLOOD RIGHT ANTECUBITAL Performed at [facility], [address], [place], [place] [address]    Special Requests   Final    BOTTLES DRAWN AEROBIC AND ANAEROBIC Blood Culture results may not be optimal due to an inadequate volume of blood received in culture bottles Performed at [facility], [address] [place], [place] [address]    Culture   Final    NO GROWTH 5 DAYS Performed at [facility], [address], [place], [place] [address]    Report Status 10/27/2023 FINAL  Final  Blood Culture (routine x 2)     Status: None   Collection Time: 10/22/23  5:25 AM   Specimen: BLOOD LEFT WRIST  Result Value Ref Range Status   Specimen Description   Final    BLOOD LEFT WRIST Performed at [facility], [address] [place], [place] [address]    Special Requests   Final    BOTTLES DRAWN AEROBIC AND ANAEROBIC Blood Culture results may not be optimal due to an inadequate volume of blood received in culture bottles Performed at [facility], [address], [place], [place] [address]    Culture   Final    NO GROWTH 5 DAYS Performed at [facility], [address] [place], [place] [address]    Report Status 10/27/2023 FINAL  Final  Urine Culture (for pregnant, neutropenic or urologic patients or patients with an indwelling urinary catheter)     Status: None   Collection Time: 10/23/23  7:20 AM   Specimen: Urine, Catheterized  Result Value Ref Range Status   Specimen Description   Final    URINE, CATHETERIZED Performed  at [facility], [address], [place], [place] [address]    Special Requests   Final    NONE Performed at [facility], [address] [place], [place] [address]    Culture   Final    NO GROWTH Performed at [facility], [address] [place], [place] [address]    Report Status 10/24/2023 FINAL  Final  MRSA Next Gen by PCR, Nasal     Status: None   Collection Time: 10/26/23 11:32 AM  Specimen: Nasal Mucosa; Nasal Swab  Result Value Ref Range Status   MRSA by PCR Next Gen NOT DETECTED NOT DETECTED Final    Comment: (NOTE) The GeneXpert MRSA Assay (FDA approved for NASAL specimens only), is one component of a comprehensive MRSA colonization surveillance program. It is not intended to diagnose MRSA infection nor to guide or monitor treatment for MRSA infections. Test performance is not FDA approved in patients less than 61 years old. Performed at [facility], [address] [place], [place] [address]   Culture, blood (Routine X 2) w Reflex to ID Panel     Status: None (Preliminary result)   Collection Time: 10/26/23  3:33 PM   Specimen: BLOOD RIGHT HAND  Result Value Ref Range Status   Specimen Description   Final    BLOOD RIGHT HAND Performed at [facility], [address], [place], [place] [address]    Special Requests   Final    BOTTLES DRAWN AEROBIC AND ANAEROBIC Blood Culture results may not be optimal due to an inadequate volume of blood received in culture bottles Performed at [facility], [address], [place], [place] [address]    Culture   Final    NO GROWTH 4 DAYS Performed at [facility], [address] [place], [place] [address]    Report Status PENDING  Incomplete  Culture, blood (Routine X 2) w Reflex to ID Panel     Status: None (Preliminary result)   Collection Time: 10/26/23  3:33 PM   Specimen: BLOOD RIGHT HAND  Result Value Ref Range Status    Specimen Description   Final    BLOOD RIGHT HAND Performed at [facility], [address] [place], [place] [address]    Special Requests   Final    BOTTLES DRAWN AEROBIC AND ANAEROBIC Blood Culture results may not be optimal due to an inadequate volume of blood received in culture bottles Performed at [facility], [address], [place], [place] [address]    Culture   Final    NO GROWTH 4 DAYS Performed at [facility], [address], [place], [place] [address]    Report Status PENDING  Incomplete         Radiology Studies: No results found.      Scheduled Meds:  atorvastatin   20 mg Oral Daily   budesonide -glycopyrrolate -formoterol   2 puff Inhalation BID   buPROPion   450 mg Oral q morning   Chlorhexidine  Gluconate Cloth  6 each Topical Daily   epoetin  alfa-epbx (RETACRIT ) injection  10,000 Units Subcutaneous Once   feeding supplement  237 mL Oral BID BM   gabapentin   300 mg Oral BID   lamoTRIgine   200 mg Oral QHS   levothyroxine   75 mcg Oral Q0600   midodrine   10 mg Oral TID WC   montelukast   10 mg Oral QHS   morphine   15 mg Oral Q12H   mouth rinse  15 mL Mouth Rinse 4 times per day   prenatal multivitamin  1 tablet Oral Q1200   silver  sulfADIAZINE    Topical BID   valACYclovir   500 mg Oral Daily   Continuous Infusions:  promethazine  (PHENERGAN ) injection (IM or IVPB) Stopped (10/31/23 0422)     LOS: 7 days   Time spent:  Elsie JAYSON [place], DO [facility]  If 7PM-7AM, please contact night-coverage [email or web address]  10/31/2023, 8:04 AM

## 2023-10-31 NOTE — Plan of Care (Signed)
  Problem: Education: Goal: Knowledge of General Education information will improve Description: Including pain rating scale, medication(s)/side effects and non-pharmacologic comfort measures Outcome: Progressing   Problem: Nutrition: Goal: Adequate nutrition will be maintained Outcome: Progressing   Problem: Elimination: Goal: Will not experience complications related to urinary retention Outcome: Progressing   Problem: Skin Integrity: Goal: Risk for impaired skin integrity will decrease Outcome: Progressing

## 2023-10-31 NOTE — TOC Progression Note (Signed)
 Transition of Care [facility]) - Progression Note    Patient Details  Name: Misty [name] MRN: [id number] Date of Birth: 14-Aug-1955  [facility] CM/SW Contact  Jon ONEIDA Anon, RN Phone Number: 10/31/2023, 11:46 AM  Clinical Narrative:    NCM spoke with HCPOA Leaa Pessalano about making a choice on SNF bed, as MD states pt is medically ready. Ms Pessalano verbalizes disappointment in choices in [place] and [place]. Informed HCPOA that NCM would reach out to [facility], since they are considering, pending pt is no longer receiving radiation/ chemo treatments. NCM awaiting a response back from [place], rep at [facility] about acceptance.  NCM sent out referrals to surrounding counties, will present to pt/ HCPOA if additional offers are made. CM continuing to follow.   Expected Discharge Plan: Skilled Nursing Facility Barriers to Discharge: Continued Medical Work up               Expected Discharge Plan and Services In-house Referral: NA Discharge Planning Services: CM Consult Post Acute Care Choice: IP Rehab Living arrangements for the past 2 months: Apartment                 DME Arranged: N/A DME Agency: NA       HH Arranged: NA HH Agency: NA         Social Drivers of Health (SDOH) Interventions SDOH Screenings   Food Insecurity: No Food Insecurity (10/22/2023)  Recent Concern: Food Insecurity - Food Insecurity Present (08/17/2023)   Received from [facility]  Housing: Low Risk  (10/22/2023)  Transportation Needs: No Transportation Needs (10/22/2023)  Utilities: Not At Risk (10/22/2023)  Depression (PHQ2-9): Low Risk  (10/20/2023)  Financial Resource Strain: High Risk (08/17/2023)   Received from [facility]  Physical Activity: Insufficiently Active (08/17/2023)   Received from [facility]  Social Connections: Moderately Integrated (10/22/2023)  Stress: No Stress Concern Present (08/17/2023)   Received from [facility]  Tobacco Use:  Medium Risk (10/22/2023)    Readmission Risk Interventions     No data to display

## 2023-11-01 DIAGNOSIS — G9341 Metabolic encephalopathy: Secondary | ICD-10-CM | POA: Diagnosis not present

## 2023-11-01 MED ORDER — FUROSEMIDE 10 MG/ML IJ SOLN
INTRAMUSCULAR | Status: AC
Start: 2023-11-01 — End: 2023-11-02
  Filled 2023-11-01: qty 2

## 2023-11-01 MED ORDER — SENNOSIDES-DOCUSATE SODIUM 8.6-50 MG PO TABS
1.0000 | ORAL_TABLET | Freq: Two times a day (BID) | ORAL | Status: DC | PRN
  Administered 2023-11-01: 1 via ORAL
  Filled 2023-11-01: qty 1

## 2023-11-01 NOTE — Plan of Care (Signed)

## 2023-11-01 NOTE — Progress Notes (Signed)
 PROGRESS NOTE    Misty [name]  FMW:[id number] DOB: 1955/04/12 DOA: 10/22/2023 PCP: Dorcus [name], MD   Brief Narrative:  Misty [name] is a 68 y.o. 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 TRH consulted for admission for further evaluation management of acute metabolic encephalopathy, acute pulmonary embolism, weakness, debility, uncontrolled pain of malignancy.  Patient currently medically stable for discharge, will forego the last 2 rounds of radiation given patient's anemia and intolerance. Otherwise we will continue physical therapy and will complete antibiotic course 8/7. Awaiting approval/decision on SNF given current needs and unsafe discharge home at this time given level of care required.  Assessment & Plan:   Principal Problem:   Encephalopathy, metabolic Active Problems:   Squamous cell carcinoma of anus (HCC)   Febrile neutropenia (HCC)   Refusal of blood product  Acute metabolic encephalopathy: Resolved Likely multifactorial in the setting of concurrent chemoradiation, multiple CNS depressants/opiates as well as fever with acutely noted PE. - CT head unremarkable for acute findings - UA within normal limits - Weaned narcotics given appropriate improvement in mental status  Febrile neutropenia Pneumonia Severe sepsis, not POA - Without clear source, CT chest abdomen pelvis with questionable pneumonia - Continue broad-spectrum antibiotics(cefepime  for 7-day course -to complete a 725) - Blood cultures x 2 7/30: No growth to date - Blood cultures x 2 8/3: No growth to date - Urine culture 7/31: No growth - Continue cefepime ; plan  7-day course   Profound symptomatic anemia  - Complicated by treatment given above as well as known active squamous cell carcinoma of the anus - [employer] unable to receive transfusion - Discontinue daily labs to avoid further iatrogenic anemia - Hemoglobin generally downtrending, oncology following status post filgrastim  and EPO - Resume iron at discharge - Eliquis  discontinued -would resume once patient's CBC improves - FOBT pending  Hypotension - Midodrine  to 10 mg p.o. 3 times daily   Acute pulmonary embolism -Holding Eliquis  given worsening anemia and thrombocytopenia   Hypokalemia Hypophosphatemia Hypomagnesemia - Likely secondary to poor p.o. intake, advance diet as tolerated - Hold further blood draws given anemia    Urinary retention Foley catheter placed by urology 2 days prior to admission.  CT abdomen/pelvis with diffuse bladder wall thickening, likely consistent with chronic bladder outlet obstruction. -- Continue Foley catheter, outpatient follow-up with urology   Squamous cell carcinoma anus - Medical oncology following, appreciate assistance - Radiation course stopped   Pain of malignancy - Improving on current regimen, MS contin , oxycodone  IR, morphine  IV - Attempted transition off of IV narcotics over the next 24 hours - Gabapentin  reduced to 300 mg p.o. twice daily - Continue home baclofen  10 mg p.o. 3 times daily as needed muscle spasms (not utilizing over past 48h)  Hypothyroidism - Levothyroxine  25 mcg p.o. daily   Hyperlipidemia - Atorvastatin  20 mg p.o. daily   Anxiety/depression - Bupropion  450 mg p.o. every morning - Valium  5 mg p.o. twice daily as needed anxiety   COPD, not in acute exacerbation - Breztri  2 puffs twice daily - Singulair  10 mg p.o. nightly - Albuterol  neb every 2 hours.  Wheezing/shortness of breath   Mucositis - Magic mouthwash   Insomnia - Trazodone  25 mg p.o. nightly as needed  sleep   Constipation -  Senokot S/MiraLAX  daily - Avoid rectal suppositories given rectal pain with history of anal cancer - Strict I's and O's, monitor bowel movements closely   Weakness/ambulatory dysfunction/deconditioning/gait disturbance: -- PT/OT recommend SNF placement -- TOC consulted for placement  DVT prophylaxis: Chemical prophylaxis on hold due to profound anemia/thrombocytopenia Code Status:   Code Status: Limited: Do not attempt resuscitation (DNR) -DNR-LIMITED -Do Not Intubate/DNI   Family Communication: Daughter at bedside  Status is: Inpatient  Dispo: The patient is from: Home              Anticipated d/c is to: To be determined              Anticipated d/c date is: 24 to 48 hours              Patient currently not medically stable for discharge  Consultants:  Oncology, radiation oncology  Procedures:  As above  Antimicrobials:  Cefepime  x 7 days  Subjective: Acute issues or events overnight denies nausea vomiting diarrhea headache fevers chills or chest pain  Objective: Vitals:   11/01/23 0329 11/01/23 0400 11/01/23 0500 11/01/23 0700  BP:  110/60 (!) 101/53 (!) 115/41  Pulse:  (!) 101 80 78  Resp:  18 14 17   Temp: 97.6 F (36.4 C)     TempSrc: Oral     SpO2:  98% 97% 97%  Weight:      Height:        Intake/Output Summary (Last 24 hours) at 11/01/2023 0722 Last data filed at 11/01/2023 0448 Gross per 24 hour  Intake 480 ml  Output 3700 ml  Net -3220 ml   Filed Weights   10/22/23 1629 10/26/23 1324  Weight: 72 kg 72.5 kg    Examination:  General:  Pleasantly resting in bed, No acute distress. HEENT:  Normocephalic atraumatic.  Sclerae nonicteric, noninjected.  Extraocular movements intact bilaterally. Neck:  Without mass or deformity.  Trachea is midline. Lungs:  Clear to auscultate bilaterally without rhonchi, wheeze, or rales. Heart:  Regular rate and rhythm.  Without murmurs, rubs, or gallops. Abdomen:  Soft, nontender, nondistended.  Without guarding or  rebound. Extremities: Without cyanosis, clubbing, edema, or obvious deformity. Skin:  Warm and dry, no erythema.   Data Reviewed: I have personally reviewed following labs and imaging studies  CBC: Recent Labs  Lab 10/26/23 0647 10/27/23 0307 10/28/23 0320 10/29/23 0316  WBC 0.3* 1.2* 4.9 5.2  NEUTROABS  --   --  4.0  --   HGB 5.8* 5.4* 6.1* 5.8*  HCT 18.5* 17.5* 19.6* 18.6*  MCV 97.9 97.8 100.5* 101.1*  PLT 66* 57* 79* 82*   Basic Metabolic Panel: Recent Labs  Lab 10/26/23 0647 10/27/23 0307 10/28/23 0320 10/29/23 0316  NA 131* 136 133* 136  K 3.1* 4.0 3.6 3.3*  CL 103 108 106 108  CO2 19* 19* 17* 18*  GLUCOSE 124* 102* 104* 102*  BUN 10 11 11 11   CREATININE 0.84 0.88 0.84 0.76  CALCIUM  8.0* 8.2* 8.5* 8.3*  MG  --   --  1.8 2.2  PHOS  --   --  2.0* 2.6   GFR: Estimated Creatinine Clearance: 65.1 mL/min (by C-G formula based on SCr of 0.76 mg/dL). Liver Function Tests: Recent Labs  Lab 10/27/23 0307  AST 51*  ALT 40  ALKPHOS 50  BILITOT 0.3  PROT 4.7*  ALBUMIN 1.7*   No results for input(s): LIPASE, AMYLASE in the last 168 hours. No  results for input(s): AMMONIA in the last 168 hours. Coagulation Profile: No results for input(s): INR, PROTIME in the last 168 hours.  Cardiac Enzymes: No results for input(s): CKTOTAL, CKMB, CKMBINDEX, TROPONINI in the last 168 hours. BNP (last 3 results) No results for input(s): PROBNP in the last 8760 hours. HbA1C: No results for input(s): HGBA1C in the last 72 hours. CBG: No results for input(s): GLUCAP in the last 168 hours. Lipid Profile: No results for input(s): CHOL, HDL, LDLCALC, TRIG, CHOLHDL, LDLDIRECT in the last 72 hours. Thyroid Function Tests: No results for input(s): TSH, T4TOTAL, FREET4, T3FREE, THYROIDAB in the last 72 hours. Anemia Panel: No results for input(s): VITAMINB12, FOLATE, FERRITIN, TIBC, IRON, RETICCTPCT in the last 72  hours. Sepsis Labs: Recent Labs  Lab 10/27/23 0307 10/28/23 0320  PROCALCITON 3.89 2.30    Recent Results (from the past 240 hours)  Urine Culture (for pregnant, neutropenic or urologic patients or patients with an indwelling urinary catheter)     Status: None   Collection Time: 10/23/23  7:20 AM   Specimen: Urine, Catheterized  Result Value Ref Range Status   Specimen Description   Final    URINE, CATHETERIZED Performed at [facility], [address] [place], [place] [address]    Special Requests   Final    NONE Performed at [facility], [address], [place], [place] [address]    Culture   Final    NO GROWTH Performed at [facility], [address] [place], [place] [address]    Report Status 10/24/2023 FINAL  Final  MRSA Next Gen by PCR, Nasal     Status: None   Collection Time: 10/26/23 11:32 AM   Specimen: Nasal Mucosa; Nasal Swab  Result Value Ref Range Status   MRSA by PCR Next Gen NOT DETECTED NOT DETECTED Final    Comment: (NOTE) The GeneXpert MRSA Assay (FDA approved for NASAL specimens only), is one component of a comprehensive MRSA colonization surveillance program. It is not intended to diagnose MRSA infection nor to guide or monitor treatment for MRSA infections. Test performance is not FDA approved in patients less than 39 years old. Performed at [facility], [address], [place], [place] [address]   Culture, blood (Routine X 2) w Reflex to ID Panel     Status: None   Collection Time: 10/26/23  3:33 PM   Specimen: BLOOD RIGHT HAND  Result Value Ref Range Status   Specimen Description   Final    BLOOD RIGHT HAND Performed at [facility], [address] [place], [place] [address]    Special Requests   Final    BOTTLES DRAWN AEROBIC AND ANAEROBIC Blood Culture results may not be optimal due to an inadequate volume of blood received in culture bottles Performed at [facility], [address], [place], [place] [address]    Culture   Final    NO GROWTH 5 DAYS Performed at [facility], [address] [place], [place] [address]    Report Status 10/31/2023 FINAL  Final  Culture, blood (Routine X 2) w Reflex to ID Panel     Status: None   Collection Time: 10/26/23  3:33 PM   Specimen: BLOOD RIGHT HAND  Result Value Ref Range Status   Specimen Description   Final    BLOOD RIGHT HAND Performed at [facility], [address], [place], [place] [address]  Special Requests   Final    BOTTLES DRAWN AEROBIC AND ANAEROBIC Blood Culture results may not be optimal due to an inadequate volume of blood received in culture bottles Performed at [facility], [address], [place], [place] [address]    Culture   Final    NO GROWTH 5 DAYS Performed at [facility], [address] [place], [place] [address]    Report Status 10/31/2023 FINAL  Final         Radiology Studies: No results found.      Scheduled Meds:  atorvastatin   20 mg Oral Daily   budesonide -glycopyrrolate -formoterol   2 puff Inhalation BID   buPROPion   450 mg Oral q morning   Chlorhexidine  Gluconate Cloth  6 each Topical Daily   epoetin  alfa  10,000 Units Intravenous Once   feeding supplement  237 mL Oral BID BM   gabapentin   300 mg Oral BID   lamoTRIgine   200 mg Oral QHS   levothyroxine   75 mcg Oral Q0600   midodrine   10 mg Oral TID WC   montelukast   10 mg Oral QHS   morphine   15 mg Oral Q12H   mouth rinse  15 mL Mouth Rinse 4 times per day   prenatal multivitamin  1 tablet Oral Q1200   silver  sulfADIAZINE    Topical BID   valACYclovir   500 mg Oral Daily   Continuous Infusions:  promethazine  (PHENERGAN ) injection (IM or IVPB) Stopped (10/31/23 0422)     LOS: 8 days   Time spent:  Elsie JAYSON Montclair, DO [facility]  If 7PM-7AM, please contact night-coverage [email or web address]  11/01/2023, 7:22 AM

## 2023-11-02 DIAGNOSIS — G9341 Metabolic encephalopathy: Secondary | ICD-10-CM | POA: Diagnosis not present

## 2023-11-02 MED ORDER — SENNOSIDES-DOCUSATE SODIUM 8.6-50 MG PO TABS
1.0000 | ORAL_TABLET | Freq: Two times a day (BID) | ORAL | Status: DC
  Administered 2023-11-02 – 2023-11-03 (×4): 1 via ORAL
  Filled 2023-11-02 (×3): qty 1

## 2023-11-02 NOTE — TOC Progression Note (Signed)
 Transition of Care [facility]) - Progression Note    Patient Details  Name: Misty [name] MRN: [id number] Date of Birth: [birth year]  [facility] CM/SW Contact  Jon ONEIDA Anon, RN Phone Number: 11/02/2023, 3:35 PM  Clinical Narrative:    NCM spoke with pt and pt would like to  accept bed at [facility]. NCM spoke with rep Glenys at [facility] and she states they have started insurance auth on 8/8. Awaiting insurance approval. Care Management continuing to follow.    Expected Discharge Plan: Skilled Nursing Facility Barriers to Discharge: Continued Medical Work up               Expected Discharge Plan and Services In-house Referral: NA Discharge Planning Services: CM Consult Post Acute Care Choice: IP Rehab Living arrangements for the past 2 months: Apartment                 DME Arranged: N/A DME Agency: NA       HH Arranged: NA HH Agency: NA         Social Drivers of Health (SDOH) Interventions SDOH Screenings   Food Insecurity: No Food Insecurity (10/22/2023)  Recent Concern: Food Insecurity - Food Insecurity Present (08/17/2023)   Received from [facility]  Housing: Low Risk  (10/22/2023)  Transportation Needs: No Transportation Needs (10/22/2023)  Utilities: Not At Risk (10/22/2023)  Depression (PHQ2-9): Low Risk  (10/20/2023)  Financial Resource Strain: High Risk (08/17/2023)   Received from [facility]  Physical Activity: Insufficiently Active (08/17/2023)   Received from [facility]  Social Connections: Moderately Integrated (10/22/2023)  Stress: No Stress Concern Present (08/17/2023)   Received from [facility]  Tobacco Use: Medium Risk (10/22/2023)    Readmission Risk Interventions     No data to display

## 2023-11-02 NOTE — Progress Notes (Addendum)
 PROGRESS NOTE    Misty [name]  FMW:[id number] DOB: [birth year] DOA: 10/22/2023 PCP: Dorcus [name], MD   Brief Narrative:  Misty [name] is a 68 y.o. 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 TRH consulted for admission for further evaluation management of acute metabolic encephalopathy, acute pulmonary embolism, weakness, debility, uncontrolled pain of malignancy.  Patient currently medically stable for discharge, will forego the last 2 rounds of radiation given patient's anemia and intolerance. Otherwise we will continue physical therapy and will complete antibiotic course 8/7. Awaiting approval/decision on SNF given current needs and unsafe discharge home at this time given level of care required.  Assessment & Plan:   Principal Problem:   Encephalopathy, metabolic Active Problems:   Squamous cell carcinoma of anus (HCC)   Febrile neutropenia (HCC)   Refusal of blood product  Acute metabolic encephalopathy: Resolved Likely multifactorial in the setting of concurrent chemoradiation, multiple CNS depressants/opiates as well as fever with acutely noted PE. - CT head unremarkable for acute findings - UA within normal limits - Weaned narcotics given appropriate improvement in mental status  Febrile neutropenia Pneumonia Severe sepsis, not POA - Without clear source, CT chest abdomen pelvis with questionable pneumonia - Continue broad-spectrum antibiotics(cefepime  for 7-day course -to complete a 725) - Blood cultures x 2 7/30: No growth to date - Blood cultures x 2 8/3: No growth to date - Urine culture 7/31: No growth - Continue cefepime ; plan  7-day course   Profound symptomatic anemia  - Complicated by treatment given above as well as known active squamous cell carcinoma of the anus - Jehov[employer]'s Witness unable to receive transfusion - Discontinue daily labs to avoid further iatrogenic anemia - Hemoglobin generally downtrending, oncology following status post filgrastim  and EPO - Resume iron at discharge - Eliquis  discontinued -would resume once patient's CBC improves - FOBT not collected  Hypotension - Midodrine  to 10 mg p.o. 3 times daily   Acute pulmonary embolism -Holding Eliquis  given worsening anemia and thrombocytopenia   Hypokalemia Hypophosphatemia Hypomagnesemia - Likely secondary to poor p.o. intake, advance diet as tolerated - Hold further blood draws given anemia    Urinary retention Foley catheter placed by urology 2 days prior to admission.  CT abdomen/pelvis with diffuse bladder wall thickening, likely consistent with chronic bladder outlet obstruction. -- Continue Foley catheter, outpatient follow-up with urology   Squamous cell carcinoma anus - Medical oncology following, appreciate assistance - Radiation course stopped   Pain of malignancy - Improving on current regimen, MS contin , oxycodone  IR, morphine  IV - Attempted transition off of IV narcotics over the next 24 hours - Gabapentin  reduced to 300 mg p.o. twice daily - Continue home baclofen  10 mg p.o. 3 times daily as needed muscle spasms (not utilizing over past 48h)  Hypothyroidism - Levothyroxine  25 mcg p.o. daily   Hyperlipidemia - Atorvastatin  20 mg p.o. daily   Anxiety/depression - Bupropion  450 mg p.o. every morning - Valium  5 mg p.o. twice daily as needed anxiety   COPD, not in acute exacerbation - Breztri  2 puffs twice daily - Singulair  10 mg p.o. nightly - Albuterol  neb every 2 hours.  Wheezing/shortness of breath   Mucositis - Magic mouthwash   Insomnia - Trazodone  25 mg p.o. nightly as  needed sleep   Constipation -  Senokot S twice daily - Avoid MiraLAX  due to previous episode of diarrhea - Avoid rectal suppositories given rectal pain with history of anal cancer - Strict I's and O's, monitor bowel movements closely   Weakness/ambulatory dysfunction/deconditioning/gait disturbance: -- PT/OT recommend SNF placement -- TOC consulted for placement  DVT prophylaxis: Chemical prophylaxis on hold due to profound anemia/thrombocytopenia Code Status:   Code Status: Limited: Do not attempt resuscitation (DNR) -DNR-LIMITED -Do Not Intubate/DNI   Family Communication: Daughter at bedside  Status is: Inpatient  Dispo: The patient is from: Home              Anticipated d/c is to: To be determined              Anticipated d/c date is: 24 to 48 hours              Patient currently not medically stable for discharge  Consultants:  Oncology, radiation oncology  Procedures:  As above  Antimicrobials:  Cefepime  completed 10/30/2023  Subjective: Acute issues or events overnight denies nausea vomiting diarrhea headache fevers chills or chest pain -clinically appears to be improving, states she feels that she has more energy and is less tired, likely secondary to resolving anemia.  Objective: Vitals:   11/01/23 1909 11/01/23 2138 11/02/23 0507 11/02/23 0808  BP:  (!) 118/47 (!) 109/46 (!) 106/46  Pulse:  87 87 85  Resp:  16 16 16   Temp:  98.2 F (36.8 C) 98.3 F (36.8 C) 98.5 F (36.9 C)  TempSrc:  Oral Oral Oral  SpO2: 96% 93% 93% 91%  Weight:      Height:        Intake/Output Summary (Last 24 hours) at 11/02/2023 0820 Last data filed at 11/02/2023 0800 Gross per 24 hour  Intake 1920 ml  Output 2750 ml  Net -830 ml   Filed Weights   10/22/23 1629 10/26/23 1324  Weight: 72 kg 72.5 kg    Examination:  General:  Pleasantly resting in bed, No acute distress. HEENT:  Normocephalic atraumatic.  Sclerae nonicteric, noninjected.  Extraocular movements intact bilaterally. Neck:  Without mass or  deformity.  Trachea is midline. Lungs:  Clear to auscultate bilaterally without rhonchi, wheeze, or rales. Heart:  Regular rate and rhythm.  Without murmurs, rubs, or gallops. Abdomen:  Soft, nontender, nondistended.  Without guarding or rebound. Extremities: Without cyanosis, clubbing, edema, or obvious deformity. Skin:  Warm and dry, no erythema.   Data Reviewed: I have personally reviewed following labs and imaging studies  CBC: Recent Labs  Lab 10/27/23 0307 10/28/23 0320 10/29/23 0316  WBC 1.2* 4.9 5.2  NEUTROABS  --  4.0  --   HGB 5.4* 6.1* 5.8*  HCT 17.5* 19.6* 18.6*  MCV 97.8 100.5* 101.1*  PLT 57* 79* 82*   Basic Metabolic Panel: Recent Labs  Lab 10/27/23 0307 10/28/23 0320 10/29/23 0316  NA 136 133* 136  K 4.0 3.6 3.3*  CL 108 106 108  CO2 19* 17* 18*  GLUCOSE 102* 104* 102*  BUN 11 11 11   CREATININE 0.88 0.84 0.76  CALCIUM  8.2* 8.5* 8.3*  MG  --  1.8 2.2  PHOS  --  2.0* 2.6   GFR: Estimated Creatinine Clearance: 65.1 mL/min (by C-G formula based on SCr of 0.76 mg/dL). Liver Function Tests: Recent Labs  Lab 10/27/23 0307  AST 51*  ALT 40  ALKPHOS 50  BILITOT 0.3  PROT 4.7*  ALBUMIN 1.7*  No results for input(s): LIPASE, AMYLASE in the last 168 hours. No results for input(s): AMMONIA in the last 168 hours. Coagulation Profile: No results for input(s): INR, PROTIME in the last 168 hours.  Cardiac Enzymes: No results for input(s): CKTOTAL, CKMB, CKMBINDEX, TROPONINI in the last 168 hours. BNP (last 3 results) No results for input(s): PROBNP in the last 8760 hours. HbA1C: No results for input(s): HGBA1C in the last 72 hours. CBG: No results for input(s): GLUCAP in the last 168 hours. Lipid Profile: No results for input(s): CHOL, HDL, LDLCALC, TRIG, CHOLHDL, LDLDIRECT in the last 72 hours. Thyroid Function Tests: No results for input(s): TSH, T4TOTAL, FREET4, T3FREE, THYROIDAB in the last 72  hours. Anemia Panel: No results for input(s): VITAMINB12, FOLATE, FERRITIN, TIBC, IRON, RETICCTPCT in the last 72 hours. Sepsis Labs: Recent Labs  Lab 10/27/23 0307 10/28/23 0320  PROCALCITON 3.89 2.30    Recent Results (from the past 240 hours)  MRSA Next Gen by PCR, Nasal     Status: None   Collection Time: 10/26/23 11:32 AM   Specimen: Nasal Mucosa; Nasal Swab  Result Value Ref Range Status   MRSA by PCR Next Gen NOT DETECTED NOT DETECTED Final    Comment: (NOTE) The GeneXpert MRSA Assay (FDA approved for NASAL specimens only), is one component of a comprehensive MRSA colonization surveillance program. It is not intended to diagnose MRSA infection nor to guide or monitor treatment for MRSA infections. Test performance is not FDA approved in patients less than 61 years old. Performed at [facility], [address], [place], [place] [address]   Culture, blood (Routine X 2) w Reflex to ID Panel     Status: None   Collection Time: 10/26/23  3:33 PM   Specimen: BLOOD RIGHT HAND  Result Value Ref Range Status   Specimen Description   Final    BLOOD RIGHT HAND Performed at [facility], [address] [place], [place] [address]    Special Requests   Final    BOTTLES DRAWN AEROBIC AND ANAEROBIC Blood Culture results may not be optimal due to an inadequate volume of blood received in culture bottles Performed at [facility], [address] [place], [place] [address]    Culture   Final    NO GROWTH 5 DAYS Performed at [facility], [address], [place], [place] [address]    Report Status 10/31/2023 FINAL  Final  Culture, blood (Routine X 2) w Reflex to ID Panel     Status: None   Collection Time: 10/26/23  3:33 PM   Specimen: BLOOD RIGHT HAND  Result Value Ref Range Status   Specimen Description   Final    BLOOD RIGHT HAND Performed at [facility], [address] [place], [place] [address]     Special Requests   Final    BOTTLES DRAWN AEROBIC AND ANAEROBIC Blood Culture results may not be optimal due to an inadequate volume of blood received in culture bottles Performed at [facility] [address], [place], [place] [address]    Culture   Final    NO GROWTH 5 DAYS Performed at [facility], [address], [place], [place] [address]    Report Status 10/31/2023 FINAL  Final         Radiology Studies: No results found.      Scheduled Meds:  atorvastatin   20 mg Oral Daily   budesonide -glycopyrrolate -formoterol   2 puff Inhalation BID   buPROPion   450 mg Oral q morning   Chlorhexidine  Gluconate Cloth  6 each Topical Daily   epoetin  alfa  10,000 Units Intravenous Once   feeding supplement  237 mL Oral BID BM   gabapentin   300 mg Oral BID   lamoTRIgine   200 mg Oral QHS   levothyroxine   75 mcg Oral Q0600   midodrine   10 mg Oral TID WC   montelukast   10 mg Oral QHS   morphine   15 mg Oral Q12H   mouth rinse  15 mL Mouth Rinse 4 times per day   prenatal multivitamin  1 tablet Oral Q1200   silver  sulfADIAZINE    Topical BID   valACYclovir   500 mg Oral Daily   Continuous Infusions:  promethazine  (PHENERGAN ) injection (IM or IVPB) Stopped (10/31/23 0422)     LOS: 9 days   Time spent:  Elsie JAYSON [place], DO [facility]  If 7PM-7AM, please contact night-coverage [email or web address]  11/02/2023, 8:20 AM

## 2023-11-02 NOTE — Plan of Care (Signed)
 Pt affect calm and pleasant. Tolerated scheduled po meds and diet well. Pt has some maceration of buttocks, topical cream and gauze effective for treatment. Foley catheter intact and in place. She is standby with walker. Planned to discharge to [facility] rehab. Will continue to monitor progress.

## 2023-11-03 ENCOUNTER — Other Ambulatory Visit (HOSPITAL_COMMUNITY): Payer: Self-pay

## 2023-11-03 ENCOUNTER — Encounter: Payer: Self-pay | Admitting: Hematology

## 2023-11-03 DIAGNOSIS — G9341 Metabolic encephalopathy: Secondary | ICD-10-CM | POA: Diagnosis not present

## 2023-11-03 MED ORDER — NYSTATIN 100000 UNIT/ML MT SUSP
5.0000 mL | Freq: Three times a day (TID) | OROMUCOSAL | 0 refills | Status: DC | PRN
  Filled 2023-11-03: qty 200, 14d supply, fill #0

## 2023-11-03 MED ORDER — BUTALBITAL-APAP-CAFFEINE 50-325-40 MG PO TABS: 1.0000 | ORAL_TABLET | ORAL | 0 refills | Status: DC | PRN

## 2023-11-03 MED ORDER — SENNOSIDES-DOCUSATE SODIUM 8.6-50 MG PO TABS
1.0000 | ORAL_TABLET | Freq: Two times a day (BID) | ORAL | 0 refills | Status: DC
  Filled 2023-11-03: qty 60, 30d supply, fill #0

## 2023-11-03 MED ORDER — DIAZEPAM 5 MG PO TABS: 5.0000 mg | ORAL_TABLET | Freq: Two times a day (BID) | ORAL | 0 refills | Status: AC | PRN

## 2023-11-03 MED ORDER — OXYCODONE HCL 5 MG PO TABS: 5.0000 mg | ORAL_TABLET | ORAL | 0 refills | Status: DC | PRN

## 2023-11-03 MED ORDER — MIDODRINE HCL 10 MG PO TABS
10.0000 mg | ORAL_TABLET | Freq: Three times a day (TID) | ORAL | 0 refills | Status: DC
  Filled 2023-11-03: qty 90, 30d supply, fill #0

## 2023-11-03 MED ORDER — ACETAMINOPHEN 325 MG PO TABS
650.0000 mg | ORAL_TABLET | Freq: Four times a day (QID) | ORAL | 0 refills | Status: AC | PRN
  Filled 2023-11-03: qty 30, 4d supply, fill #0

## 2023-11-03 MED ORDER — BUTALBITAL-APAP-CAFFEINE 50-325-40 MG PO TABS: 1.0000 | ORAL_TABLET | ORAL | 0 refills | Status: AC | PRN

## 2023-11-03 MED ORDER — MORPHINE SULFATE ER 15 MG PO TBCR: 15.0000 mg | EXTENDED_RELEASE_TABLET | Freq: Two times a day (BID) | ORAL | 0 refills | Status: DC

## 2023-11-03 MED ORDER — LAMOTRIGINE 200 MG PO TABS
200.0000 mg | ORAL_TABLET | Freq: Every day | ORAL | 0 refills | Status: DC
Start: 2023-11-03 — End: 2023-11-03

## 2023-11-03 MED ORDER — MORPHINE SULFATE ER 15 MG PO TBCR: 15.0000 mg | EXTENDED_RELEASE_TABLET | Freq: Two times a day (BID) | ORAL | 0 refills | Status: AC

## 2023-11-03 MED ORDER — LAMOTRIGINE 200 MG PO TABS: 200.0000 mg | ORAL_TABLET | Freq: Every day | ORAL | 0 refills | Status: AC

## 2023-11-03 MED ORDER — BACLOFEN 10 MG PO TABS: 10.0000 mg | ORAL_TABLET | Freq: Two times a day (BID) | ORAL | 0 refills | Status: AC | PRN

## 2023-11-03 MED ORDER — DIAZEPAM 5 MG PO TABS: 5.0000 mg | ORAL_TABLET | Freq: Two times a day (BID) | ORAL | 0 refills | Status: DC | PRN

## 2023-11-03 MED ORDER — LORATADINE 10 MG PO TABS
10.0000 mg | ORAL_TABLET | Freq: Every day | ORAL | 0 refills | Status: DC | PRN
  Filled 2023-11-03: qty 30, 30d supply, fill #0

## 2023-11-03 MED ORDER — ONDANSETRON HCL 4 MG PO TABS
4.0000 mg | ORAL_TABLET | Freq: Four times a day (QID) | ORAL | 0 refills | Status: DC | PRN
  Filled 2023-11-03: qty 20, 5d supply, fill #0

## 2023-11-03 MED ORDER — BACLOFEN 10 MG PO TABS: 10.0000 mg | ORAL_TABLET | Freq: Two times a day (BID) | ORAL | 0 refills | Status: DC | PRN

## 2023-11-03 MED ORDER — OXYCODONE HCL 5 MG PO TABS: 5.0000 mg | ORAL_TABLET | ORAL | 0 refills | Status: AC | PRN

## 2023-11-03 NOTE — Progress Notes (Signed)
 Report called to [place] at facility. Pt transported via [facility] w all belongings in stable condition.

## 2023-11-03 NOTE — TOC Transition Note (Signed)
 Transition of Care [facility]) - Discharge Note   Patient Details  Name: Misty [name] MRN: [id number] Date of Birth: 08-25-1955  Transition of [facility]) CM/SW Contact:  NORMAN ASPEN, LCSW Phone Number: 11/03/2023, 2:48 PM   Clinical Narrative:     Pt medically cleared for dc to [facility] and facility has secured insurance authorization.  Pt and friend/ HC POA aware and agreeable.  [facility] called at 2:30pm.  RN to call report to [phone].  No further TOC needs.  Final next level of care: Skilled Nursing Facility Barriers to Discharge: Barriers Resolved   Patient Goals and CMS Choice Patient states their goals for this hospitalization and ongoing recovery are:: Wants to be able to get stronger and go home. CMS Medicare.gov Compare Post Acute Care list provided to:: Patient Choice offered to / list presented to : Patient, HC POA / [place] ownership interest in [facility].provided to:: Patient    Discharge Placement PASRR number recieved: 11/03/23            Patient chooses bed at: [facility] Patient to be transferred to facility by: [facility] Name of family member notified: [facility] POA, Leaa Pessalano Patient and family notified of of transfer: 11/03/23  Discharge Plan and Services Additional resources added to the After Visit Summary for   In-house Referral: NA Discharge Planning Services: CM Consult Post Acute Care Choice: IP Rehab          DME Arranged: N/A DME Agency: NA       HH Arranged: NA HH Agency: NA        Social Drivers of Health (SDOH) Interventions SDOH Screenings   Food Insecurity: No Food Insecurity (10/22/2023)  Recent Concern: Food Insecurity - Food Insecurity Present (08/17/2023)   Received from [facility]  Housing: Low Risk  (10/22/2023)  Transportation Needs: No Transportation Needs (10/22/2023)  Utilities: Not At Risk (10/22/2023)  Depression (PHQ2-9): Low Risk  (10/20/2023)  Financial  Resource Strain: High Risk (08/17/2023)   Received from [facility]  Physical Activity: Insufficiently Active (08/17/2023)   Received from [facility]  Social Connections: Moderately Integrated (10/22/2023)  Stress: No Stress Concern Present (08/17/2023)   Received from [facility]  Tobacco Use: Medium Risk (10/22/2023)     Readmission Risk Interventions    11/03/2023    2:46 PM  Readmission Risk Prevention Plan  Transportation Screening Complete  PCP or Specialist Appt within 5-7 Days Complete  Home Care Screening Complete  Medication Review (RN CM) Complete

## 2023-11-03 NOTE — Plan of Care (Signed)
  Problem: Pain Managment: Goal: General experience of comfort will improve and/or be controlled Outcome: Progressing
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
## 2023-11-03 NOTE — Progress Notes (Signed)
 Misty [name]   DOB:07/15/1955   FM#:[id number]      ASSESSMENT & PLAN:  Misty [name] is a 68 year old female patient with oncologic history significant for Anal CA.  She was admitted on 7/30 with weakness and confusion and found to be neutropenic and anemic.     Anemia Thrombocytopenia - Likely due to recent chemotherapy - Hemoglobin worsened 5.4--6.1-- 5.8 on 10/29/23 - Epoietin/Retacrit  given X4 doses, last dose 8/8.   - Patient is Jehovah witness, no blood transfusions - Platelets 82K, no intervention required at this time  - Labs have been on HOLD since 8/6, will repeat as outpatient.  - Oncology/Dr. 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 -- Patient requested discontinuation of last two radiation therapies due to ongoing rectal pain and discomfort, reports she has received 28 of 30 treatments.  Rad Onc aware.  - Will continue to monitor closely in outpatient oncology   Neutropenic fever Septic shock Pneumonia - Improving - WBC improved to 5.2. when last checked 8/6.  Discontinued G-CSF. - Afebrile, monitor fever curve   Acute pulmonary embolism - Started on Eliquis  however has been discontinued due to anemia and thrombocytopenia - Will plan restart DOAC as appropriate   Acute metabolic encephalopathy - Resolved        Code Status DNR-Limited  Subjective:  Patient seen awake and alert eating breakfast. Reports that she feels much better than when she was admitted. Rectal pain has improved significantly. Complains of constipation though. No shortness of breath. Indeed patient appears much better than last week. No other complaints.  Objective:   Intake/Output Summary (Last 24 hours) at 11/03/2023 0953 Last data filed at 11/03/2023 0600 Gross per 24 hour  Intake 1356 ml  Output 3275 ml  Net -1919 ml     PHYSICAL  EXAMINATION: ECOG PERFORMANCE STATUS: 3 - Symptomatic, >50% confined to bed  Vitals:   11/03/23 0528 11/03/23 0907  BP: 99/69   Pulse: 86   Resp: 19   Temp: 98.5 F (36.9 C)   SpO2: 94% 91%   Filed Weights   10/22/23 1629 10/26/23 1324  Weight: 158 lb 11.7 oz (72 kg) 159 lb 13.3 oz (72.5 kg)    GENERAL: alert, no distress and comfortable SKIN: skin color, texture, turgor are normal, no rashes or significant lesions EYES: normal, conjunctiva are pink and non-injected, sclera clear OROPHARYNX: no exudate, no erythema and lips, buccal mucosa, and tongue normal  NECK: supple, thyroid normal size, non-tender, without nodularity LYMPH: no palpable lymphadenopathy in the cervical, axillary or inguinal LUNGS: clear to auscultation and percussion with normal breathing effort HEART: regular rate & rhythm and no murmurs and no lower extremity edema ABDOMEN: +rectal pain improving MUSCULOSKELETAL: no cyanosis of digits and no clubbing  PSYCH: alert & oriented x 3 with fluent speech NEURO: no focal motor/sensory deficits   All questions were answered. The patient knows to call the clinic with any problems, questions or concerns.   The total time spent in the appointment was 40 minutes encounter with patient including review of chart and various tests results, discussions about plan of care and coordination of care plan  Olam JINNY Brunner, NP 11/03/2023 9:53 AM    Labs Reviewed:  Lab Results  Component Value Date   WBC 5.2 10/29/2023   HGB 5.8 (LL) 10/29/2023   HCT 18.6 (L) 10/29/2023  MCV 101.1 (H) 10/29/2023   PLT 82 (L) 10/29/2023   Recent Labs    10/20/23 0941 10/22/23 0405 10/23/23 0636 10/27/23 0307 10/28/23 0320 10/29/23 0316  NA 138 131*   < > 136 133* 136  K 3.6 3.3*   < > 4.0 3.6 3.3*  CL 103 102   < > 108 106 108  CO2 29 22   < > 19* 17* 18*  GLUCOSE 101* 113*   < > 102* 104* 102*  BUN 26* 21   < > 11 11 11   CREATININE 1.24* 1.09*   < > 0.88 0.84 0.76  CALCIUM   9.1 8.5*   < > 8.2* 8.5* 8.3*  GFRNONAA 48* 56*   < > >60 >60 >60  PROT 6.5 5.8*  --  4.7*  --   --   ALBUMIN 3.6 3.0*  --  1.7*  --   --   AST 11* 17  --  51*  --   --   ALT 15 17  --  40  --   --   ALKPHOS 70 61  --  50  --   --   BILITOT 0.4 0.7  --  0.3  --   --   BILIDIR  --   --   --  <0.1  --   --   IBILI  --   --   --  NOT CALCULATED  --   --    < > = values in this interval not displayed.    Studies Reviewed:  CT CHEST ABDOMEN PELVIS W CONTRAST Result Date: 10/26/2023 CLINICAL DATA:  Sepsis. EXAM: CT CHEST, ABDOMEN, AND PELVIS WITH CONTRAST TECHNIQUE: Multidetector CT imaging of the chest, abdomen and pelvis was performed following the standard protocol during bolus administration of intravenous contrast. RADIATION DOSE REDUCTION: This exam was performed according to the departmental dose-optimization program which includes automated exposure control, adjustment of the mA and/or kV according to patient size and/or use of iterative reconstruction technique. CONTRAST:  80mL OMNIPAQUE  IOHEXOL  300 MG/ML  SOLN COMPARISON:  10/22/2023. FINDINGS: CT CHEST FINDINGS Cardiovascular: The heart is normal in size and there is a small pericardial effusion. There is atherosclerotic calcification of the aorta without evidence of aneurysm. The pulmonary trunk is normal in caliber. Mediastinum/Nodes: No mediastinal, hilar, or axillary lymphadenopathy is seen. The thyroid gland and trachea are within normal limits. The esophagus is mildly distended with fluid. Lungs/Pleura: Paraseptal and centrilobular emphysematous changes are noted in the lungs. Scattered airspace disease and consolidation are present in the lungs bilaterally, involving right lower lobe, left lower lobe, and right upper lobe. No effusion or pneumothorax is seen. Musculoskeletal: Degenerative changes are present in the thoracic spine. No acute osseous abnormality is seen. CT ABDOMEN PELVIS FINDINGS Hepatobiliary: No focal liver abnormality is  seen. No gallstones, gallbladder wall thickening, or biliary dilatation. Pancreas: Unremarkable. No pancreatic ductal dilatation or surrounding inflammatory changes. Spleen: Normal in size without focal abnormality. Adrenals/Urinary Tract: The adrenal glands are within normal limits. The kidneys enhance symmetrically. Subcentimeter hypodensities are present in the kidneys bilaterally which are too small to further characterize. No renal calculus or hydronephrosis is seen. Diffuse bladder wall thickening is noted. Stomach/Bowel: The stomach is within normal limits. No bowel obstruction, free air, or pneumatosis is seen. The appendix is surgically absent. There is bowel wall thickening involving the rectum with surrounding inflammatory changes. Vascular/Lymphatic: Aortic atherosclerosis. No enlarged abdominal or pelvic lymph nodes. Reproductive: Status post hysterectomy. No adnexal masses. Other:  There is a trace amount of perisplenic free fluid. Edema and a small amount of free fluid are present in the presacral space. Musculoskeletal: Degenerative changes are present in the lumbar spine. No acute osseous abnormality is seen. IMPRESSION: 1. Patchy airspace disease and consolidation in right upper, right lower, and left lower lobes, concerning for pneumonia. 2. Diffuse bladder wall thickening suggesting infectious or inflammatory cystitis. 3. Rectal wall thickening with surrounding fat stranding and edema, compatible with proctitis. 4. Emphysema. 5. Aortic atherosclerosis and coronary artery calcifications. Electronically Signed   By: Leita Birmingham M.D.   On: 10/26/2023 14:17   CT Angio Chest Pulmonary Embolism (PE) W or WO Contrast Addendum Date: 10/22/2023 ADDENDUM REPORT: 10/22/2023 10:42 ADDENDUM: Critical Value/emergent results were called by telephone at the time of interpretation on 10/22/2023 at 10:32 a.m. to provider [facility] , who verbally acknowledged these results. Electronically Signed   By:  Toribio Agreste M.D.   On: 10/22/2023 10:42   Result Date: 10/22/2023 CLINICAL DATA:  History of squamous cell anal carcinoma undergoing chemotherapy. Patient with confusion and possible fever. Suspect pulmonary embolism. EXAM: CT ANGIOGRAPHY CHEST WITH CONTRAST TECHNIQUE: Multidetector CT imaging of the chest was performed using the standard protocol during bolus administration of intravenous contrast. Multiplanar CT image reconstructions and MIPs were obtained to evaluate the vascular anatomy. RADIATION DOSE REDUCTION: This exam was performed according to the departmental dose-optimization program which includes automated exposure control, adjustment of the mA and/or kV according to patient size and/or use of iterative reconstruction technique. CONTRAST:  75mL OMNIPAQUE  IOHEXOL  350 MG/ML SOLN COMPARISON:  PET-CT 10/22/2023 FINDINGS: Cardiovascular: Mild cardiomegaly. Thoracic aorta is normal in caliber. Minimal calcified plaque over the descending thoracic aorta. Pulmonary arterial system is adequately opacified. Findings suggesting subtle small subsegmental embolus over the left lingular pulmonary artery as well as anterior branch right middle lobe pulmonary artery. Remaining vascular structures are unremarkable. Mediastinum/Nodes: No evidence of mediastinal or hilar adenopathy. Remaining mediastinal structures are unremarkable. Lungs/Pleura: Lungs are adequately inflated. Minimal posterior bibasilar dependent atelectasis. No significant effusion. Suggestion of mild centrilobular emphysematous disease. Airways are normal. Upper Abdomen: No acute findings. Musculoskeletal: No acute or concerning findings. Review of the MIP images confirms the above findings. IMPRESSION: 1. Findings suggesting subtle small subsegmental embolus over the left lingular pulmonary artery as well as anterior branch right middle lobe pulmonary artery. No evidence of right heart strain. 2. Mild cardiomegaly. 3. Suggestion of mild  centrilobular emphysematous disease. 4. Aortic atherosclerosis. Aortic Atherosclerosis (ICD10-I70.0) and Emphysema (ICD10-J43.9). Ordering physician has been paged. Electronically Signed: By: Toribio Agreste M.D. On: 10/22/2023 10:24   CT Head Wo Contrast Result Date: 10/22/2023 CLINICAL DATA:  68 year old female with altered mental status. Stage IV anal rectal cancer. EXAM: CT HEAD WITHOUT CONTRAST TECHNIQUE: Contiguous axial images were obtained from the base of the skull through the vertex without intravenous contrast. RADIATION DOSE REDUCTION: This exam was performed according to the departmental dose-optimization program which includes automated exposure control, adjustment of the mA and/or kV according to patient size and/or use of iterative reconstruction technique. COMPARISON:  Head CT 11/27/2014. FINDINGS: Brain: No midline shift, ventriculomegaly, mass effect, evidence of mass lesion, intracranial hemorrhage or evidence of cortically based acute infarction. Patchy mild for age bilateral cerebral white matter hypodensity. Otherwise maintained gray-white differentiation. No cortical encephalomalacia identified. Vascular: No suspicious intracranial vascular hyperdensity. Calcified atherosclerosis at the skull base. Skull: Intact.  No acute or suspicious osseous lesion identified. Sinuses/Orbits: Visualized paranasal sinuses and mastoids are stable and well  aerated. Other: Visualized orbits and scalp soft tissues are within normal limits. IMPRESSION: No acute intracranial abnormality by noncontrast CT. Mild for age cerebral white matter changes, most commonly due to chronic small vessel disease. Electronically Signed   By: VEAR Hurst M.D.   On: 10/22/2023 07:19   DG Chest 2 View Result Date: 10/22/2023 EXAM: 2 VIEW(S) XRAY OF THE CHEST 10/22/2023 04:41:00 AM COMPARISON: 10/08/2019 CLINICAL HISTORY: Fever. Chemo, fever. FINDINGS: LUNGS AND PLEURA: No focal pulmonary opacity. No pulmonary edema. No pleural  effusion. No pneumothorax. HEART AND MEDIASTINUM: No acute abnormality of the cardiac and mediastinal silhouettes. Aortic atherosclerotic calcification. BONES AND SOFT TISSUES: Remote fracture deformity involving the midshaft of right clavicle. No acute osseous abnormality. IMPRESSION: 1. No acute cardiopulmonary pathology. Electronically signed by: Waddell Calk MD 10/22/2023 05:46 AM EDT RP Workstation: [id number]   IR PICC PLACEMENT RIGHT >5 YRS INC IMG GUIDE Result Date: 10/13/2023 INDICATION: 68 year old female with history of anal cancer presents in need of durable venous access. Peripherally inserted central catheter requested. EXAM: PICC LINE PLACEMENT WITH ULTRASOUND AND FLUOROSCOPIC GUIDANCE MEDICATIONS: 5 mL 1% lidocaine  ANESTHESIA/SEDATION: None FLUOROSCOPY: Radiation Exposure Index (as provided by the fluoroscopic device): 1.0 mGy Kerma COMPLICATIONS: None immediate. PROCEDURE: The patient was advised of the possible risks and complications and agreed to undergo the procedure. The patient was then brought to the angiographic suite for the procedure. The right arm was prepped with chlorhexidine , draped in the usual sterile fashion using maximum barrier technique (cap and mask, sterile gown, sterile gloves, large sterile sheet, hand hygiene and cutaneous antisepsis) and infiltrated locally with 1% Lidocaine . Ultrasound demonstrated patency of the right basilic vein, and this was documented with an image. Under real-time ultrasound guidance, this vein was accessed with a 21 gauge micropuncture needle and image documentation was performed. A 0.018 wire was introduced in to the vein. Over this, a 5 [place] dual lumen power PICC was advanced to the lower SVC/right atrial junction. Fluoroscopy during the procedure and fluoro spot radiograph confirms appropriate catheter position. The catheter was flushed and covered with a sterile dressing. Catheter length: 34 IMPRESSION: Successful right arm Power, dual  lumen PICC line placement with ultrasound and fluoroscopic guidance. The catheter is ready for use. Performed by: Kacie Matthews PA-C Electronically Signed   By: Cordella Banner   On: 10/13/2023 11:13

## 2023-11-03 NOTE — Plan of Care (Signed)

## 2023-11-03 NOTE — Discharge Summary (Signed)
 Physician Discharge Summary  Misty [name] FMW:[id number] DOB: 1955/05/28 DOA: 10/22/2023  PCP: Dorcus [name], MD  Admit date: 10/22/2023 Discharge date: 11/03/2023  Admitted From: Home Disposition:  SNF  Recommendations for Outpatient Follow-up:  Follow up with PCP in 1-2 weeks Follow up with oncology as scheduled  Discharge Condition: Stable CODE STATUS: DNR Diet recommendation: Regular diet as tolerated  Brief/Interim Summary: Misty [name] is a 68 y.o. 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 TRH consulted for admission for further evaluation management of acute metabolic encephalopathy, acute pulmonary embolism, weakness, debility, uncontrolled pain of malignancy.   Patient currently medically stable for discharge, will forego the last 2 rounds of radiation given patient's anemia and intolerance. Otherwise we will continue physical therapy and will complete antibiotic course 8/7.  Patient insurance now approved discharge to skilled nursing facility for ongoing treatment.  Continue medications as below, multiple medication changes in regards to patient's pain regimen, otherwise continue core measures, advance diet as tolerated and continue treatment with oncology as previously scheduled.    Discharge Diagnoses:  Principal Problem:   Encephalopathy, metabolic Active Problems:   Squamous cell carcinoma of anus (HCC)   Febrile neutropenia (HCC)   Refusal of blood product  Acute metabolic encephalopathy: Resolved Likely multifactorial in the setting of concurrent chemoradiation, multiple CNS depressants/opiates as well as fever with acutely  noted PE. - CT head unremarkable for acute findings - UA within normal limits - Continue new narcotic regimen as below   Febrile neutropenia, resolved Presumed pneumonia, POA Severe sepsis, not POA - CT chest abdomen pelvis with questionable pneumonia - Continue broad-spectrum antibiotics(cefepime  for 7-day course -to complete a 725) - Blood cultures x 2 7/30: No growth to date - Blood cultures x 2 8/3: No growth to date - Urine culture 7/31: No growth - Cefepime  completed; plan 7-day course   Profound symptomatic anemia, likely improving - Complicated by treatment given above as well as known active squamous cell carcinoma of the anus - Je[employer] unable to receive transfusion - Discontinue daily labs to avoid further iatrogenic anemia - Hemoglobin generally downtrending, oncology following status post filgrastim  and EPO - Resume iron at discharge - Eliquis  discontinued -would resume once patient's CBC improves - FOBT not collected   Hypotension - Midodrine  to 10 mg p.o. 3 times daily   Acute pulmonary embolism -Holding Eliquis  given worsening anemia and thrombocytopenia -Consider restarting Eliquis  should patient's repeat labs improve in the next 1 to 2 weeks -she remains high risk for embolism as well as for bleeding, patient understands risk factors and is okay with holding anticoagulation at this time.   Hypokalemia Hypophosphatemia Hypomagnesemia -Improving with p.o., advance diet as tolerated - Likely secondary to poor p.o. intake, advance diet as tolerated - Hold further unnecessary blood draws given anemia    Urinary retention Foley catheter placed by urology 2 days prior to admission.  CT abdomen/pelvis with diffuse bladder wall thickening, likely consistent with chronic bladder outlet obstruction. -- Continue Foley catheter, outpatient follow-up with urology   Squamous cell carcinoma anus - Medical oncology following, appreciate assistance - Radiation  course stopped   Pain of malignancy - Improving on current regimen, MS contin , oxycodone  IR, morphine  IV - Attempted transition off of  IV narcotics over the next 24 hours - Gabapentin  reduced to 300 mg p.o. twice daily - Continue home baclofen  10 mg p.o. 3 times daily as needed muscle spasms (not utilizing over past 48h)   Hypothyroidism - Levothyroxine  25 mcg p.o. daily   Hyperlipidemia - Atorvastatin  20 mg p.o. daily   Anxiety/depression - Bupropion  450 mg p.o. every morning - Valium  5 mg p.o. twice daily as needed anxiety   COPD, not in acute exacerbation - Breztri  2 puffs twice daily - Singulair  10 mg p.o. nightly - Albuterol  neb every 2 hours.  Wheezing/shortness of breath   Mucositis - Magic mouthwash   Insomnia - Trazodone  25 mg p.o. nightly as needed sleep   Constipation - Senokot S twice daily - Avoid MiraLAX  due to previous episode of diarrhea - Avoid rectal suppositories given rectal pain with history of anal cancer - Strict I's and O's, monitor bowel movements closely   Weakness/ambulatory dysfunction/deconditioning/gait disturbance: -- PT/OT recommend SNF placement  Discharge Instructions  Discharge Instructions     Call MD for:  difficulty breathing, headache or visual disturbances   Complete by: As directed    Call MD for:  extreme fatigue   Complete by: As directed    Call MD for:  hives   Complete by: As directed    Call MD for:  persistant dizziness or light-headedness   Complete by: As directed    Call MD for:  persistant nausea and vomiting   Complete by: As directed    Call MD for:  severe uncontrolled pain   Complete by: As directed    Call MD for:  temperature >100.4   Complete by: As directed    Diet - low sodium heart healthy   Complete by: As directed    Discharge wound care:   Complete by: As directed    Cleanse vagina/perineum/rectal area with NS, apply Vaseline gauze (Lawson #239) daily and as needed.  May cover with ABD pad and  secure with mesh panties if patient can tolerate   Increase activity slowly   Complete by: As directed       Allergies as of 11/03/2023       Reactions   Codeine Nausea Only, Other (See Comments)   skin crawling Can take hydrocodone          Medication List     STOP taking these medications    dexamethasone  4 MG tablet Commonly known as: DECADRON    HYDROmorphone  2 MG tablet Commonly known as: Dilaudid    LORazepam  0.5 MG tablet Commonly known as: ATIVAN    naltrexone 50 MG tablet Commonly known as: DEPADE       TAKE these medications    acetaminophen  325 MG tablet Commonly known as: TYLENOL  Take 2 tablets (650 mg total) by mouth every 6 (six) hours as needed for mild pain (pain score 1-3) or fever (or Fever >/= 101). What changed:  medication strength how much to take when to take this reasons to take this   albuterol  108 (90 Base) MCG/ACT inhaler Commonly known as: VENTOLIN  HFA Inhale 2 puffs into the lungs every 6 (six) hours as needed for wheezing or shortness of breath.   atorvastatin  20 MG tablet Commonly known as: LIPITOR Take 20 mg by mouth daily.   baclofen  10 MG tablet Commonly known as: LIORESAL  Take 1 tablet (10 mg total) by mouth 2 (two) times daily as needed for muscle spasms.   buPROPion  150 MG 24 hr tablet Commonly known as: WELLBUTRIN  XL  Take 450 mg by mouth every morning.   butalbital -acetaminophen -caffeine  50-325-40 MG tablet Commonly known as: FIORICET Take 1 tablet by mouth every 4 (four) hours as needed for headache.   CENTRUM ADULT PO Take 1 tablet by mouth daily at 12 noon.   diazepam  5 MG tablet Commonly known as: VALIUM  Take 1 tablet (5 mg total) by mouth 2 (two) times daily as needed for anxiety. What changed:  when to take this reasons to take this   gabapentin  600 MG tablet Commonly known as: NEURONTIN  Take 600 mg by mouth 2 (two) times daily. Take 0.5 tablet (300 mg) in the morning and Take 1.5 (900 mg) in the  evening   guaiFENesin  600 MG 12 hr tablet Commonly known as: MUCINEX  Take 600 mg by mouth 2 (two) times daily as needed for cough.   ipratropium-albuterol  0.5-2.5 (3) MG/3ML Soln Commonly known as: DUONEB Take 3 mLs by nebulization every 4 (four) hours as needed. What changed: reasons to take this   lamoTRIgine  200 MG tablet Commonly known as: LAMICTAL  Take 1 tablet (200 mg total) by mouth at bedtime.   levothyroxine  75 MCG tablet Commonly known as: SYNTHROID  Take 0.75 mcg by mouth daily.   loratadine  10 MG tablet Commonly known as: CLARITIN  Take 1 tablet (10 mg total) by mouth daily as needed for allergies. What changed:  when to take this reasons to take this   magic mouthwash (nystatin , lidocaine , diphenhydrAMINE, alum & mag hydroxide) suspension Swish and swallow 5 mLs by mouth 3 (three) times daily as needed for mouth pain. What changed: Another medication with the same name was added. Make sure you understand how and when to take each.   magic mouthwash w/lidocaine  Soln Take 5 mLs by mouth 3 (three) times daily as needed for mouth pain. Suspension contains equal amounts of Maalox Extra Strength, nystatin , diphenhydramine and lidocaine . What changed: You were already taking a medication with the same name, and this prescription was added. Make sure you understand how and when to take each.   midodrine  10 MG tablet Commonly known as: PROAMATINE  Take 1 tablet (10 mg total) by mouth 3 (three) times daily with meals.   MiraLax  17 g packet Generic drug: polyethylene glycol Take 17 g by mouth daily as needed for mild constipation, moderate constipation or severe constipation.   montelukast  10 MG tablet Commonly known as: SINGULAIR  Take 1 tablet (10 mg total) by mouth at bedtime.   morphine  15 MG 12 hr tablet Commonly known as: MS CONTIN  Take 1 tablet (15 mg total) by mouth every 12 (twelve) hours for 7 days.   ondansetron  4 MG tablet Commonly known as: ZOFRAN  Take 1  tablet (4 mg total) by mouth every 6 (six) hours as needed for nausea. What changed:  medication strength how much to take when to take this reasons to take this additional instructions   oxyCODONE  5 MG immediate release tablet Commonly known as: Oxy IR/ROXICODONE  Take 1-2 tablets (5-10 mg total) by mouth every 4 (four) hours as needed for up to 7 days for moderate pain (pain score 4-6) or severe pain (pain score 7-10).   promethazine  25 MG tablet Commonly known as: PHENERGAN  Take 1 tablet (25 mg total) by mouth every 6 (six) hours as needed for nausea or vomiting.   senna-docusate 8.6-50 MG tablet Commonly known as: Senokot-S Take 1 tablet by mouth 2 (two) times daily.   Trelegy Ellipta  100-62.5-25 MCG/ACT Aepb Generic drug: Fluticasone -Umeclidin-Vilant Inhale 1 puff into the lungs daily.  valACYclovir  500 MG tablet Commonly known as: VALTREX  Take 500 mg by mouth daily.               Discharge Care Instructions  (From admission, onward)           Start     Ordered   11/03/23 0000  Discharge wound care:       Comments: Cleanse vagina/perineum/rectal area with NS, apply Vaseline gauze Soila [phone]) daily and as needed.  May cover with ABD pad and secure with mesh panties if patient can tolerate   11/03/23 1341            Contact information for after-discharge care     Destination     [place] [facility] [place], [place] .   Service: Skilled Nursing Contact information: [address] [place]  [address] [phone]                    Allergies  Allergen Reactions   Codeine Nausea Only and Other (See Comments)    skin crawling Can take hydrocodone      Consultations: Oncology, radiology oncology  Procedures/Studies: CT CHEST ABDOMEN PELVIS W CONTRAST Result Date: 10/26/2023 CLINICAL DATA:  Sepsis. EXAM: CT CHEST, ABDOMEN, AND PELVIS WITH CONTRAST TECHNIQUE: Multidetector CT imaging of the chest, abdomen and pelvis was  performed following the standard protocol during bolus administration of intravenous contrast. RADIATION DOSE REDUCTION: This exam was performed according to the departmental dose-optimization program which includes automated exposure control, adjustment of the mA and/or kV according to patient size and/or use of iterative reconstruction technique. CONTRAST:  80mL OMNIPAQUE  IOHEXOL  300 MG/ML  SOLN COMPARISON:  10/22/2023. FINDINGS: CT CHEST FINDINGS Cardiovascular: The heart is normal in size and there is a small pericardial effusion. There is atherosclerotic calcification of the aorta without evidence of aneurysm. The pulmonary trunk is normal in caliber. Mediastinum/Nodes: No mediastinal, hilar, or axillary lymphadenopathy is seen. The thyroid gland and trachea are within normal limits. The esophagus is mildly distended with fluid. Lungs/Pleura: Paraseptal and centrilobular emphysematous changes are noted in the lungs. Scattered airspace disease and consolidation are present in the lungs bilaterally, involving right lower lobe, left lower lobe, and right upper lobe. No effusion or pneumothorax is seen. Musculoskeletal: Degenerative changes are present in the thoracic spine. No acute osseous abnormality is seen. CT ABDOMEN PELVIS FINDINGS Hepatobiliary: No focal liver abnormality is seen. No gallstones, gallbladder wall thickening, or biliary dilatation. Pancreas: Unremarkable. No pancreatic ductal dilatation or surrounding inflammatory changes. Spleen: Normal in size without focal abnormality. Adrenals/Urinary Tract: The adrenal glands are within normal limits. The kidneys enhance symmetrically. Subcentimeter hypodensities are present in the kidneys bilaterally which are too small to further characterize. No renal calculus or hydronephrosis is seen. Diffuse bladder wall thickening is noted. Stomach/Bowel: The stomach is within normal limits. No bowel obstruction, free air, or pneumatosis is seen. The appendix is  surgically absent. There is bowel wall thickening involving the rectum with surrounding inflammatory changes. Vascular/Lymphatic: Aortic atherosclerosis. No enlarged abdominal or pelvic lymph nodes. Reproductive: Status post hysterectomy. No adnexal masses. Other: There is a trace amount of perisplenic free fluid. Edema and a small amount of free fluid are present in the presacral space. Musculoskeletal: Degenerative changes are present in the lumbar spine. No acute osseous abnormality is seen. IMPRESSION: 1. Patchy airspace disease and consolidation in right upper, right lower, and left lower lobes, concerning for pneumonia. 2. Diffuse bladder wall thickening suggesting infectious or inflammatory cystitis. 3. Rectal wall thickening  with surrounding fat stranding and edema, compatible with proctitis. 4. Emphysema. 5. Aortic atherosclerosis and coronary artery calcifications. Electronically Signed   By: Leita Birmingham M.D.   On: 10/26/2023 14:17   CT Angio Chest Pulmonary Embolism (PE) W or WO Contrast Addendum Date: 10/22/2023 ADDENDUM REPORT: 10/22/2023 10:42 ADDENDUM: Critical Value/emergent results were called by telephone at the time of interpretation on 10/22/2023 at 10:32 a.m. to provider [facility] , who verbally acknowledged these results. Electronically Signed   By: Toribio Agreste M.D.   On: 10/22/2023 10:42   Result Date: 10/22/2023 CLINICAL DATA:  History of squamous cell anal carcinoma undergoing chemotherapy. Patient with confusion and possible fever. Suspect pulmonary embolism. EXAM: CT ANGIOGRAPHY CHEST WITH CONTRAST TECHNIQUE: Multidetector CT imaging of the chest was performed using the standard protocol during bolus administration of intravenous contrast. Multiplanar CT image reconstructions and MIPs were obtained to evaluate the vascular anatomy. RADIATION DOSE REDUCTION: This exam was performed according to the departmental dose-optimization program which includes automated exposure  control, adjustment of the mA and/or kV according to patient size and/or use of iterative reconstruction technique. CONTRAST:  75mL OMNIPAQUE  IOHEXOL  350 MG/ML SOLN COMPARISON:  PET-CT 10/22/2023 FINDINGS: Cardiovascular: Mild cardiomegaly. Thoracic aorta is normal in caliber. Minimal calcified plaque over the descending thoracic aorta. Pulmonary arterial system is adequately opacified. Findings suggesting subtle small subsegmental embolus over the left lingular pulmonary artery as well as anterior branch right middle lobe pulmonary artery. Remaining vascular structures are unremarkable. Mediastinum/Nodes: No evidence of mediastinal or hilar adenopathy. Remaining mediastinal structures are unremarkable. Lungs/Pleura: Lungs are adequately inflated. Minimal posterior bibasilar dependent atelectasis. No significant effusion. Suggestion of mild centrilobular emphysematous disease. Airways are normal. Upper Abdomen: No acute findings. Musculoskeletal: No acute or concerning findings. Review of the MIP images confirms the above findings. IMPRESSION: 1. Findings suggesting subtle small subsegmental embolus over the left lingular pulmonary artery as well as anterior branch right middle lobe pulmonary artery. No evidence of right heart strain. 2. Mild cardiomegaly. 3. Suggestion of mild centrilobular emphysematous disease. 4. Aortic atherosclerosis. Aortic Atherosclerosis (ICD10-I70.0) and Emphysema (ICD10-J43.9). Ordering physician has been paged. Electronically Signed: By: Toribio Agreste M.D. On: 10/22/2023 10:24   CT Head Wo Contrast Result Date: 10/22/2023 CLINICAL DATA:  68 year old female with altered mental status. Stage IV anal rectal cancer. EXAM: CT HEAD WITHOUT CONTRAST TECHNIQUE: Contiguous axial images were obtained from the base of the skull through the vertex without intravenous contrast. RADIATION DOSE REDUCTION: This exam was performed according to the departmental dose-optimization program which includes  automated exposure control, adjustment of the mA and/or kV according to patient size and/or use of iterative reconstruction technique. COMPARISON:  Head CT 11/27/2014. FINDINGS: Brain: No midline shift, ventriculomegaly, mass effect, evidence of mass lesion, intracranial hemorrhage or evidence of cortically based acute infarction. Patchy mild for age bilateral cerebral white matter hypodensity. Otherwise maintained gray-white differentiation. No cortical encephalomalacia identified. Vascular: No suspicious intracranial vascular hyperdensity. Calcified atherosclerosis at the skull base. Skull: Intact.  No acute or suspicious osseous lesion identified. Sinuses/Orbits: Visualized paranasal sinuses and mastoids are stable and well aerated. Other: Visualized orbits and scalp soft tissues are within normal limits. IMPRESSION: No acute intracranial abnormality by noncontrast CT. Mild for age cerebral white matter changes, most commonly due to chronic small vessel disease. Electronically Signed   By: VEAR Hurst M.D.   On: 10/22/2023 07:19   DG Chest 2 View Result Date: 10/22/2023 EXAM: 2 VIEW(S) XRAY OF THE CHEST 10/22/2023 04:41:00 AM COMPARISON: 10/08/2019 CLINICAL  HISTORY: Fever. Chemo, fever. FINDINGS: LUNGS AND PLEURA: No focal pulmonary opacity. No pulmonary edema. No pleural effusion. No pneumothorax. HEART AND MEDIASTINUM: No acute abnormality of the cardiac and mediastinal silhouettes. Aortic atherosclerotic calcification. BONES AND SOFT TISSUES: Remote fracture deformity involving the midshaft of right clavicle. No acute osseous abnormality. IMPRESSION: 1. No acute cardiopulmonary pathology. Electronically signed by: Waddell Calk MD 10/22/2023 05:46 AM EDT RP Workstation: [id number]   IR PICC PLACEMENT RIGHT >5 YRS INC IMG GUIDE Result Date: 10/13/2023 INDICATION: 68 year old female with history of anal cancer presents in need of durable venous access. Peripherally inserted central catheter requested. EXAM:  PICC LINE PLACEMENT WITH ULTRASOUND AND FLUOROSCOPIC GUIDANCE MEDICATIONS: 5 mL 1% lidocaine  ANESTHESIA/SEDATION: None FLUOROSCOPY: Radiation Exposure Index (as provided by the fluoroscopic device): 1.0 mGy Kerma COMPLICATIONS: None immediate. PROCEDURE: The patient was advised of the possible risks and complications and agreed to undergo the procedure. The patient was then brought to the angiographic suite for the procedure. The right arm was prepped with chlorhexidine , draped in the usual sterile fashion using maximum barrier technique (cap and mask, sterile gown, sterile gloves, large sterile sheet, hand hygiene and cutaneous antisepsis) and infiltrated locally with 1% Lidocaine . Ultrasound demonstrated patency of the right basilic vein, and this was documented with an image. Under real-time ultrasound guidance, this vein was accessed with a 21 gauge micropuncture needle and image documentation was performed. A 0.018 wire was introduced in to the vein. Over this, a 5 [place] dual lumen power PICC was advanced to the lower SVC/right atrial junction. Fluoroscopy during the procedure and fluoro spot radiograph confirms appropriate catheter position. The catheter was flushed and covered with a sterile dressing. Catheter length: 34 IMPRESSION: Successful right arm Power, dual lumen PICC line placement with ultrasound and fluoroscopic guidance. The catheter is ready for use. Performed by: Solmon Ku PA-C Electronically Signed   By: Cordella Banner   On: 10/13/2023 11:13     Subjective: No acute issues or events overnight denies nausea vomit diarrhea constipation headache fevers chills or chest pain   Discharge Exam: Vitals:   11/03/23 0528 11/03/23 0907  BP: 99/69   Pulse: 86   Resp: 19   Temp: 98.5 F (36.9 C)   SpO2: 94% 91%   Vitals:   11/02/23 2117 11/02/23 2127 11/03/23 0528 11/03/23 0907  BP:  (!) 135/51 99/69   Pulse:  89 86   Resp:  20 19   Temp:  98 F (36.7 C) 98.5 F (36.9 C)    TempSrc:  Oral Oral   SpO2: 96% 98% 94% 91%  Weight:      Height:        General: Pt is alert, awake, not in acute distress Cardiovascular: RRR, S1/S2 +, no rubs, no gallops Respiratory: CTA bilaterally, no wheezing, no rhonchi Abdominal: Soft, NT, ND, bowel sounds + Extremities: no edema, no cyanosis    The results of significant diagnostics from this hospitalization (including imaging, microbiology, ancillary and laboratory) are listed below for reference.     Microbiology: Recent Results (from the past 240 hours)  MRSA Next Gen by PCR, Nasal     Status: None   Collection Time: 10/26/23 11:32 AM   Specimen: Nasal Mucosa; Nasal Swab  Result Value Ref Range Status   MRSA by PCR Next Gen NOT DETECTED NOT DETECTED Final    Comment: (NOTE) The GeneXpert MRSA Assay (FDA approved for NASAL specimens only), is one component of a comprehensive MRSA colonization surveillance program. It  is not intended to diagnose MRSA infection nor to guide or monitor treatment for MRSA infections. Test performance is not FDA approved in patients less than 31 years old. Performed at [facility], [address], [place], [place] [address]   Culture, blood (Routine X 2) w Reflex to ID Panel     Status: None   Collection Time: 10/26/23  3:33 PM   Specimen: BLOOD RIGHT HAND  Result Value Ref Range Status   Specimen Description   Final    BLOOD RIGHT HAND Performed at [facility], [address] [place], [place] [address]    Special Requests   Final    BOTTLES DRAWN AEROBIC AND ANAEROBIC Blood Culture results may not be optimal due to an inadequate volume of blood received in culture bottles Performed at [facility], [address], [place], [place] [address]    Culture   Final    NO GROWTH 5 DAYS Performed at [facility], [address] [place], [place] [address]    Report Status 10/31/2023 FINAL  Final  Culture, blood (Routine X 2) w  Reflex to ID Panel     Status: None   Collection Time: 10/26/23  3:33 PM   Specimen: BLOOD RIGHT HAND  Result Value Ref Range Status   Specimen Description   Final    BLOOD RIGHT HAND Performed at [facility], [address], [place], [place] [address]    Special Requests   Final    BOTTLES DRAWN AEROBIC AND ANAEROBIC Blood Culture results may not be optimal due to an inadequate volume of blood received in culture bottles Performed at [facility], [address] [place], [place] [address]    Culture   Final    NO GROWTH 5 DAYS Performed at [facility], [address], [place], [place] [address]    Report Status 10/31/2023 FINAL  Final     Labs: BNP (last 3 results) No results for input(s): BNP in the last 8760 hours. Basic Metabolic Panel: Recent Labs  Lab 10/28/23 0320 10/29/23 0316  NA 133* 136  K 3.6 3.3*  CL 106 108  CO2 17* 18*  GLUCOSE 104* 102*  BUN 11 11  CREATININE 0.84 0.76  CALCIUM  8.5* 8.3*  MG 1.8 2.2  PHOS 2.0* 2.6   Liver Function Tests: No results for input(s): AST, ALT, ALKPHOS, BILITOT, PROT, ALBUMIN in the last 168 hours. No results for input(s): LIPASE, AMYLASE in the last 168 hours. No results for input(s): AMMONIA in the last 168 hours. CBC: Recent Labs  Lab 10/28/23 0320 10/29/23 0316  WBC 4.9 5.2  NEUTROABS 4.0  --   HGB 6.1* 5.8*  HCT 19.6* 18.6*  MCV 100.5* 101.1*  PLT 79* 82*   Cardiac Enzymes: No results for input(s): CKTOTAL, CKMB, CKMBINDEX, TROPONINI in the last 168 hours. BNP: Invalid input(s): POCBNP CBG: No results for input(s): GLUCAP in the last 168 hours. D-Dimer No results for input(s): DDIMER in the last 72 hours. Hgb A1c No results for input(s): HGBA1C in the last 72 hours. Lipid Profile No results for input(s): CHOL, HDL, LDLCALC, TRIG, CHOLHDL, LDLDIRECT in the last 72 hours. Thyroid function studies No results for input(s):  TSH, T4TOTAL, T3FREE, THYROIDAB in the last 72 hours.  Invalid input(s): FREET3 Anemia work up No results for input(s): VITAMINB12, FOLATE, FERRITIN, TIBC, IRON, RETICCTPCT in the last 72 hours. Urinalysis    Component Value Date/Time  COLORURINE YELLOW 10/22/2023 0430   APPEARANCEUR CLEAR 10/22/2023 0430   LABSPEC 1.006 10/22/2023 0430   PHURINE 6.0 10/22/2023 0430   GLUCOSEU NEGATIVE 10/22/2023 0430   HGBUR MODERATE (A) 10/22/2023 0430   BILIRUBINUR NEGATIVE 10/22/2023 0430   KETONESUR NEGATIVE 10/22/2023 0430   PROTEINUR NEGATIVE 10/22/2023 0430   UROBILINOGEN 0.2 05/15/2009 1721   NITRITE NEGATIVE 10/22/2023 0430   LEUKOCYTESUR TRACE (A) 10/22/2023 0430   Sepsis Labs Recent Labs  Lab 10/28/23 0320 10/29/23 0316  WBC 4.9 5.2   Microbiology Recent Results (from the past 240 hours)  MRSA Next Gen by PCR, Nasal     Status: None   Collection Time: 10/26/23 11:32 AM   Specimen: Nasal Mucosa; Nasal Swab  Result Value Ref Range Status   MRSA by PCR Next Gen NOT DETECTED NOT DETECTED Final    Comment: (NOTE) The GeneXpert MRSA Assay (FDA approved for NASAL specimens only), is one component of a comprehensive MRSA colonization surveillance program. It is not intended to diagnose MRSA infection nor to guide or monitor treatment for MRSA infections. Test performance is not FDA approved in patients less than 73 years old. Performed at [facility], [address], [place], [place] [address]   Culture, blood (Routine X 2) w Reflex to ID Panel     Status: None   Collection Time: 10/26/23  3:33 PM   Specimen: BLOOD RIGHT HAND  Result Value Ref Range Status   Specimen Description   Final    BLOOD RIGHT HAND Performed at [facility], [address], [place], [place] [address]    Special Requests   Final    BOTTLES DRAWN AEROBIC AND ANAEROBIC Blood Culture results may not be optimal due to an inadequate volume of blood received in  culture bottles Performed at [facility], [address], [place], [place] [address]    Culture   Final    NO GROWTH 5 DAYS Performed at [facility], [address], [place], [place] [address]    Report Status 10/31/2023 FINAL  Final  Culture, blood (Routine X 2) w Reflex to ID Panel     Status: None   Collection Time: 10/26/23  3:33 PM   Specimen: BLOOD RIGHT HAND  Result Value Ref Range Status   Specimen Description   Final    BLOOD RIGHT HAND Performed at [facility], [address], [place], [place] [address]    Special Requests   Final    BOTTLES DRAWN AEROBIC AND ANAEROBIC Blood Culture results may not be optimal due to an inadequate volume of blood received in culture bottles Performed at [facility], [address] [place], [place] [address]    Culture   Final    NO GROWTH 5 DAYS Performed at [facility], [address], [place], [place] [address]    Report Status 10/31/2023 FINAL  Final     Time coordinating discharge: Over 30 minutes  SIGNED:   Elsie JAYSON [place], DO [facility] 11/03/2023, 1:45 PM Pager   If 7PM-7AM, please contact night-coverage [email or web address]

## 2023-11-03 NOTE — TOC PASRR Note (Signed)
 30 Day PASRR Note   Patient Details  Name: Misty [name] Date of Birth: 15-Apr-1955   Transition of Care [facility]) CM/SW Contact:    NORMAN ASPEN, LCSW Phone Number: 11/03/2023, 10:42 AM  To Whom It May Concern:  Please be advised that this patient will require a short-term nursing home stay - anticipated 30 days or less for rehabilitation and strengthening.   The plan is for return home.

## 2023-11-03 NOTE — Progress Notes (Signed)
 Physical Therapy Treatment Patient Details Name: Misty [name] MRN: [id number] DOB: [birth year] Today's Date: 11/03/2023   History of Present Illness Misty [name] is a 68 y.o. female presents on 10/22/23 with worsening of anal pain, fecal incontinence, confusion and development of fever. Admitted with acute metabolic encephalopathy, PNE, sepsis, symptomatic anemia (Jehovah's witness unable to receive transfusion), PE, hypokalemia.  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 Pt reports feeling better today.  Tol amb 50' x 2 with 2 minute therapeutic rest between distances, SpO2=96% and  HR  111, repeated STS with CGA-supervision. Pt with report of mild dyspnea and encouraged to self monitor level of exertion, slow speed/pace. Pt remains motivated to work with PT and d/c plan appropriate at this time. Continue PT POC   If plan is discharge home, recommend the following: A little help with walking and/or transfers;A little help with bathing/dressing/bathroom;Assistance with cooking/housework;Help with stairs or ramp for entrance   Can travel by private vehicle     Yes  Equipment Recommendations  Other (comment) (defer to next venue-?rollator)    Recommendations for Other Services       Precautions / Restrictions Precautions Precautions: Fall Precaution/Restrictions Comments: low Hgb, does not receive blood products Restrictions Weight Bearing Restrictions Per Provider Order: No     Mobility  Bed Mobility Overal bed mobility: Needs Assistance, Independent Bed Mobility: Supine to Sit, Sit to Supine     Supine to sit: Supervision Sit to supine: Supervision   General bed mobility comments: no physical assist, able to bridge and boost over to avoid shearing    Transfers Overall transfer level: Needs assistance Equipment  used: Rolling walker (2 wheels) Transfers: Sit to/from Stand Sit to Stand: Contact guard assist           General transfer comment: STS x3, CGA for safety, cues for hand placement    Ambulation/Gait Ambulation/Gait assistance: Contact guard assist, Supervision Gait Distance (Feet): 50 Feet (x2) Assistive device: Rolling walker (2 wheels) Gait Pattern/deviations: Step-through pattern, Decreased stride length Gait velocity: decreased     General Gait Details: mild dyspnea, seated therapeutic rest with pt able to continue distance back to room after rest break. pt with LOB x1 with CGA/self recovery. SpO2=96%, HR 111   Stairs             Wheelchair Mobility     Tilt Bed    Modified Rankin (Stroke Patients Only)       Balance   Sitting-balance support: Feet supported, No upper extremity supported Sitting balance-Leahy Scale: Good     Standing balance support: No upper extremity supported, During functional activity, Reliant on assistive device for balance Standing balance-Leahy Scale: Fair Standing balance comment: able to static stand without UE support                            Communication Communication Communication: No apparent difficulties  Cognition Arousal: Alert Behavior During Therapy: WFL for tasks assessed/performed   PT - Cognitive impairments: No apparent impairments                         Following commands: Intact      Cueing Cueing Techniques: Verbal cues  Exercises      General Comments        Pertinent Vitals/Pain Pain Assessment  Pain Assessment: No/denies pain    Home Living                          Prior Function            PT Goals (current goals can now be found in the care plan section) Acute Rehab PT Goals Patient Stated Goal: control bowel movements PT Goal Formulation: With patient Time For Goal Achievement: 11/06/23 Potential to Achieve Goals: Good Progress towards PT  goals: Progressing toward goals    Frequency    Min 2X/week      PT Plan      Co-evaluation              AM-PAC PT 6 Clicks Mobility   Outcome Measure  Help needed turning from your back to your side while in a flat bed without using bedrails?: A Little Help needed moving from lying on your back to sitting on the side of a flat bed without using bedrails?: A Little Help needed moving to and from a bed to a chair (including a wheelchair)?: A Little Help needed standing up from a chair using your arms (e.g., wheelchair or bedside chair)?: A Little Help needed to walk in hospital room?: A Little Help needed climbing 3-5 steps with a railing? : A Little 6 Click Score: 18    End of Session Equipment Utilized During Treatment: Gait belt Activity Tolerance: Patient tolerated treatment well Patient left: in bed;with call bell/phone within reach;with bed alarm set Nurse Communication: Mobility status PT Visit Diagnosis: Unsteadiness on feet (R26.81);Muscle weakness (generalized) (M62.81)     Time: 8798-8781 PT Time Calculation (min) (ACUTE ONLY): 17 min  Charges:    $Gait Training: 8-22 mins PT General Charges $$ ACUTE PT VISIT: 1 Visit                     Mel Tadros, PT  Acute Rehab Dept [facility]) [phone]  11/03/2023    [facility] 11/03/2023, 12:35 PM

## 2023-11-04 ENCOUNTER — Telehealth: Payer: Self-pay | Admitting: Hematology

## 2023-11-04 ENCOUNTER — Telehealth: Payer: Self-pay | Admitting: *Deleted

## 2023-11-04 ENCOUNTER — Other Ambulatory Visit (HOSPITAL_COMMUNITY): Payer: Self-pay

## 2023-11-04 NOTE — Telephone Encounter (Signed)
 Called the patient LVM regarding  her upcoming appt.

## 2023-11-04 NOTE — Telephone Encounter (Signed)
 Spoke with the patient to see how she was doing since completing her radiation treatments 10/29/2023.  She reports she has been doing somewhat better.  She continues to have peeling to her skin with a little drainage but the silvadene  cream she was prescribed is helping a lot.  She states her bowels are still giving her some trouble and she has a foley in place.  She was informed that it can take skin changes, bowel and bladder function some time to return to normal once treatments are finished.  I let her know that we would be more than happy to see her in the office should she have any concerns.  She is currently in rehab for strengthening.  She verbalized understanding and was appreciative of the call.

## 2023-11-05 ENCOUNTER — Other Ambulatory Visit (HOSPITAL_COMMUNITY): Payer: Self-pay

## 2023-11-14 ENCOUNTER — Telehealth: Payer: Self-pay | Admitting: Pulmonary Disease

## 2023-11-14 NOTE — Telephone Encounter (Signed)
 Patient is taking singular. She is unsure if she should be taking it due to the amount of side effects that it will come with(she acquired this information from an article she read). She would like to discontinue the singular under Dr. Leatha guidances.She is currently scheduled for an appointment with him in October.

## 2023-11-14 NOTE — Telephone Encounter (Signed)
 Please advise on DC singulair 

## 2023-11-16 ENCOUNTER — Other Ambulatory Visit: Payer: Self-pay

## 2023-11-17 NOTE — Telephone Encounter (Signed)
 Is she having side effects? If not having side effects generally I continue it. If she would prefer to discontinue or stop the medication then that is no problem.

## 2023-11-18 NOTE — Assessment & Plan Note (Signed)
 cT3N1M0, stage IIIC -Presented with rectal pain and colonoscopy showed a 3 cm mass in the anus.  Biopsy confirmed squamous cell carcinoma. - Staging PET scan showed intense of hypermetabolic activity extending from the anus into the rectum, and the small bilateral inguinal lymph nodes with low-level activity.  No evidence of distant metastasis. -She started concurrent chemoradiation with mitomycin  and 5-FU on September 15, 2023. -she was hospitalized for PE, neutropenic fever and anemia on 10/22/23, last two doses RT was cancelled.

## 2023-11-18 NOTE — Telephone Encounter (Signed)
 Noted - makes sense and I agree with stopping.

## 2023-11-18 NOTE — Telephone Encounter (Signed)
 Lm x1 for patient.

## 2023-11-18 NOTE — Telephone Encounter (Signed)
 Hallucinations,  Called and spoke with the patient. Pt states she was having hallucinations and would like to stop taking Singulair . I asked pt if she noticed any other side effects and pt stated she couldn't recall as she had already threw that information away.  Pt stated if she runs into any problems in the future she would be interested in revisiting other options.  I am updating pt med list. FYI Dr. Annella pt would like to discontinue Singulair .

## 2023-11-19 ENCOUNTER — Encounter: Payer: Self-pay | Admitting: Hematology

## 2023-11-19 ENCOUNTER — Inpatient Hospital Stay: Payer: Medicare (Managed Care) | Attending: Nurse Practitioner

## 2023-11-19 ENCOUNTER — Inpatient Hospital Stay (HOSPITAL_BASED_OUTPATIENT_CLINIC_OR_DEPARTMENT_OTHER): Payer: Medicare (Managed Care) | Admitting: Hematology

## 2023-11-19 ENCOUNTER — Other Ambulatory Visit: Payer: Self-pay

## 2023-11-19 VITALS — BP 143/69 | HR 104 | Temp 97.6°F | Resp 18 | Ht 63.0 in | Wt 144.7 lb

## 2023-11-19 DIAGNOSIS — D649 Anemia, unspecified: Secondary | ICD-10-CM | POA: Diagnosis not present

## 2023-11-19 DIAGNOSIS — Z79899 Other long term (current) drug therapy: Secondary | ICD-10-CM | POA: Diagnosis not present

## 2023-11-19 DIAGNOSIS — Z9221 Personal history of antineoplastic chemotherapy: Secondary | ICD-10-CM | POA: Insufficient documentation

## 2023-11-19 DIAGNOSIS — Z923 Personal history of irradiation: Secondary | ICD-10-CM | POA: Diagnosis not present

## 2023-11-19 DIAGNOSIS — R339 Retention of urine, unspecified: Secondary | ICD-10-CM | POA: Diagnosis not present

## 2023-11-19 DIAGNOSIS — R7989 Other specified abnormal findings of blood chemistry: Secondary | ICD-10-CM | POA: Insufficient documentation

## 2023-11-19 DIAGNOSIS — D75838 Other thrombocytosis: Secondary | ICD-10-CM | POA: Insufficient documentation

## 2023-11-19 DIAGNOSIS — C210 Malignant neoplasm of anus, unspecified: Secondary | ICD-10-CM | POA: Diagnosis not present

## 2023-11-19 DIAGNOSIS — Z79624 Long term (current) use of inhibitors of nucleotide synthesis: Secondary | ICD-10-CM | POA: Diagnosis not present

## 2023-11-19 LAB — CBC WITH DIFFERENTIAL (CANCER CENTER ONLY)
Abs Immature Granulocytes: 0.35 K/uL — ABNORMAL HIGH (ref 0.00–0.07)
Basophils Absolute: 0.1 K/uL (ref 0.0–0.1)
Basophils Relative: 1 %
Eosinophils Absolute: 0.3 K/uL (ref 0.0–0.5)
Eosinophils Relative: 3 %
HCT: 32.3 % — ABNORMAL LOW (ref 36.0–46.0)
Hemoglobin: 10 g/dL — ABNORMAL LOW (ref 12.0–15.0)
Immature Granulocytes: 4 %
Lymphocytes Relative: 4 %
Lymphs Abs: 0.3 K/uL — ABNORMAL LOW (ref 0.7–4.0)
MCH: 31.3 pg (ref 26.0–34.0)
MCHC: 31 g/dL (ref 30.0–36.0)
MCV: 101.3 fL — ABNORMAL HIGH (ref 80.0–100.0)
Monocytes Absolute: 1 K/uL (ref 0.1–1.0)
Monocytes Relative: 12 %
Neutro Abs: 6.5 K/uL (ref 1.7–7.7)
Neutrophils Relative %: 76 %
Platelet Count: 430 K/uL — ABNORMAL HIGH (ref 150–400)
RBC: 3.19 MIL/uL — ABNORMAL LOW (ref 3.87–5.11)
RDW: 18.3 % — ABNORMAL HIGH (ref 11.5–15.5)
WBC Count: 8.5 K/uL (ref 4.0–10.5)
nRBC: 0 % (ref 0.0–0.2)

## 2023-11-19 LAB — CMP (CANCER CENTER ONLY)
ALT: 9 U/L (ref 0–44)
AST: 15 U/L (ref 15–41)
Albumin: 3.5 g/dL (ref 3.5–5.0)
Alkaline Phosphatase: 89 U/L (ref 38–126)
Anion gap: 8 (ref 5–15)
BUN: 9 mg/dL (ref 8–23)
CO2: 23 mmol/L (ref 22–32)
Calcium: 9.1 mg/dL (ref 8.9–10.3)
Chloride: 107 mmol/L (ref 98–111)
Creatinine: 1.13 mg/dL — ABNORMAL HIGH (ref 0.44–1.00)
GFR, Estimated: 53 mL/min — ABNORMAL LOW (ref >60)
Glucose, Bld: 124 mg/dL — ABNORMAL HIGH (ref 70–99)
Potassium: 3.5 mmol/L (ref 3.5–5.1)
Sodium: 138 mmol/L (ref 135–145)
Total Bilirubin: 0.3 mg/dL (ref 0.0–1.2)
Total Protein: 6.7 g/dL (ref 6.5–8.1)

## 2023-11-19 NOTE — Progress Notes (Signed)
 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  Biopsy confirmed squamous cell carcinoma. - Staging PET scan showed intense of hypermetabolic activity extending from the anus into the rectum, and the small bilateral inguinal lymph nodes with low-level activity.  No evidence of distant metastasis. -She started concurrent chemoradiation with mitomycin  and 5-FU on September 15, 2023. -she was hospitalized for PE, neutropenic fever and anemia on 10/22/23, last two doses RT was cancelled.   Assessment & Plan Anal cancer, post-radiation, under active surveillance Completed radiation therapy on October 24, 2023. Currently under active surveillance. Reports pain during bowel movements and impaired perianal sensation, common post-radiation effects. No visible tumor upon recent examination. - Schedule PET scan in November to assess for residual disease - Refer to colorectal surgeon, Dr. 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 Impaired perianal sensation leading to incontinence and frequent diaper use. Painful bowel movements due to tender skin in the perianal area. Insurance does not cover concurrent home physical  therapy and pelvic floor therapy. - Plan to resume pelvic floor physical therapy after completion of home physical therapy  Urinary retention with indwelling catheter Currently has an indwelling urinary catheter due to inability to void. Previous trial without catheter was unsuccessful. Follow-up with urologist scheduled for December 05, 2023, for further evaluation and management. - Continue with indwelling catheter until follow-up with urologist on December 05, 2023  Anemia, improving post-treatment Anemia is improving with current hemoglobin at 10 g/dL, the best level since hospitalization. Blood counts are recovering post-treatment.  Thrombocytosis, reactive post-treatment Platelet count is slightly above normal at 430 x 10^9/L, likely reactive post-treatment. No immediate intervention required as counts are stabilizing.  Mildly elevated creatinine  Creatinine level is slightly elevated at 1.13 mg/dL, indicating possible chronic kidney disease. - Encourage increased water intake to improve hydration  Plan - She is recovering slowly from chemoradiation treatment.  She will be released from rehab tomorrow. - She will continue home PT and OT, she is open to pelvic PT after she completes her home PT. - Lab reviewed, much improved anemia - Follow-up with her surgeon Dr. [name] in one month, I messaged Dr. [name]. - Follow-up in 2 months with lab and restaging PET scan 1 week before, to evaluate response to treatment.   SUMMARY OF ONCOLOGIC HISTORY: Oncology History  Squamous cell carcinoma of anus (HCC)  08/26/2023 Initial Diagnosis   Squamous cell carcinoma of anus (HCC)   09/09/2023 Cancer Staging   Staging form: Anus, AJCC 8th Edition - Clinical: Stage IIIC (cT3, cN1a, cM0) - Signed by [name], MD on 09/09/2023   09/15/2023 -  Chemotherapy   Patient is on Treatment Plan : ANUS Mitomycin  D1,28 + 5FU D1-4, 28-31 q32d        Discussed the  use of AI scribe software for  clinical note transcription with the patient, who gave verbal consent to proceed.  History of Present Illness Misty [name] is a 68 year old female with anal cancer who presents for follow-up.  She completed radiation therapy for anal cancer on October 24, 2023, missing the last two sessions. She experiences bowel movement irregularities, including diarrhea and constipation, managed with Senokot, Imodium , and Miralax . Lack of sensation in the anal area leads to frequent diaper use due to incontinence, and she experiences pain during bowel movements due to tender skin.  Her blood counts have improved, with a hemoglobin level of 10 and a platelet count of 430. Her creatinine is slightly elevated at 1.13. She is currently using a urinary catheter and has an upcoming appointment with a urologist on December 05, 2023, for further evaluation.     All other systems were reviewed with the patient and are negative.  MEDICAL HISTORY:  Past Medical History:  Diagnosis Date   Anxiety    Bipolar disorder (HCC)    Dr. Vincente   COPD (chronic obstructive pulmonary disease) (HCC) 2006   Depression    Hypothyroidism    Rheumatic fever    as a child, age 40yo   Urinary tract infection    hx/o   Wears glasses     SURGICAL HISTORY: Past Surgical History:  Procedure Laterality Date   ABDOMINAL HYSTERECTOMY     APPENDECTOMY     CESAREAN SECTION     UTERINE FIBROID SURGERY      I have reviewed the social history and family history with the patient and they are unchanged from previous note.  ALLERGIES:  is allergic to codeine.  MEDICATIONS:  Current Outpatient Medications  Medication Sig Dispense Refill   acetaminophen  (TYLENOL ) 325 MG tablet Take 2 tablets (650 mg total) by mouth every 6 (six) hours as needed for mild pain (pain score 1-3) or fever (or Fever >/= 101). 30 tablet 0   albuterol  (VENTOLIN  HFA) 108 (90 Base) MCG/ACT inhaler Inhale 2 puffs into the lungs every 6 (six) hours as  needed for wheezing or shortness of breath. 1 each 3   atorvastatin  (LIPITOR) 20 MG tablet Take 20 mg by mouth daily.   3   baclofen  (LIORESAL ) 10 MG tablet Take 1 tablet (10 mg total) by mouth 2 (two) times daily as needed for muscle spasms. 30 each 0   buPROPion  (WELLBUTRIN  XL) 150 MG 24 hr tablet Take 450 mg by mouth every morning.     butalbital -acetaminophen -caffeine  (FIORICET) 50-325-40 MG tablet Take 1 tablet by mouth every 4 (four) hours as needed for headache. 14 tablet 0   diazepam  (VALIUM ) 5 MG tablet Take 1 tablet (5 mg total) by mouth 2 (two) times daily as needed for anxiety. 30 tablet 0   Fluticasone -Umeclidin-Vilant (TRELEGY ELLIPTA ) 100-62.5-25 MCG/ACT AEPB Inhale 1 puff into the lungs daily. 180 each 0   gabapentin  (NEURONTIN ) 600 MG tablet Take 600 mg by mouth 2 (two) times daily. Take 0.5 tablet (300 mg) in the morning and Take 1.5 (900 mg) in the evening     guaiFENesin  (MUCINEX ) 600 MG 12 hr tablet Take 600 mg by mouth 2 (two) times daily as needed for cough.     ipratropium-albuterol  (DUONEB) 0.5-2.5 (3) MG/3ML SOLN Take 3 mLs by nebulization every 4 (four) hours as needed. (Patient taking differently: Take 3 mLs by nebulization every 4 (four) hours as needed (wheezing / shortness of breath).) 360 mL 1  lamoTRIgine  (LAMICTAL ) 200 MG tablet Take 1 tablet (200 mg total) by mouth at bedtime. 30 tablet 0   levothyroxine  (SYNTHROID , LEVOTHROID) 75 MCG tablet Take 0.75 mcg by mouth daily.     loratadine  (CLARITIN ) 10 MG tablet Take 1 tablet (10 mg total) by mouth daily as needed for allergies. 30 tablet 0   magic mouthwash (nystatin , lidocaine , diphenhydrAMINE, alum & mag hydroxide) suspension Swish and swallow 5 mLs by mouth 3 (three) times daily as needed for mouth pain. 140 mL 1   magic mouthwash (nystatin , lidocaine , diphenhydrAMINE, alum & mag hydroxide) suspension Take 5 mLs by mouth 3 (three) times daily as needed for mouth pain. 200 mL 0   midodrine  (PROAMATINE ) 10 MG tablet  Take 1 tablet (10 mg total) by mouth 3 (three) times daily with meals. 90 tablet 0   Multiple Vitamins-Minerals (CENTRUM ADULT PO) Take 1 tablet by mouth daily at 12 noon.     ondansetron  (ZOFRAN ) 4 MG tablet Take 1 tablet (4 mg total) by mouth every 6 (six) hours as needed for nausea. 20 tablet 0   polyethylene glycol (MIRALAX ) 17 g packet Take 17 g by mouth daily as needed for mild constipation, moderate constipation or severe constipation.     promethazine  (PHENERGAN ) 25 MG tablet Take 1 tablet (25 mg total) by mouth every 6 (six) hours as needed for nausea or vomiting. 30 tablet 1   senna-docusate (SENOKOT-S) 8.6-50 MG tablet Take 1 tablet by mouth 2 (two) times daily. 60 tablet 0   valACYclovir  (VALTREX ) 500 MG tablet Take 500 mg by mouth daily.     No current facility-administered medications for this visit.    PHYSICAL EXAMINATION: ECOG PERFORMANCE STATUS: 2 - Symptomatic, <50% confined to bed  Vitals:   11/19/23 0942  BP: (!) 143/69  Pulse: (!) 104  Resp: 18  Temp: 97.6 F (36.4 C)  SpO2: 100%   Wt Readings from Last 3 Encounters:  11/19/23 144 lb 11.2 oz (65.6 kg)  10/26/23 159 lb 13.3 oz (72.5 kg)  10/20/23 156 lb (70.8 kg)     GENERAL:alert, no distress and comfortable SKIN: skin color, texture, turgor are normal, no rashes or significant lesions EYES: normal, Conjunctiva are pink and non-injected, sclera clear NECK: supple, thyroid normal size, non-tender, without nodularity LYMPH:  no palpable lymphadenopathy in the cervical, axillary  LUNGS: clear to auscultation and percussion with normal breathing effort HEART: regular rate & rhythm and no murmurs and no lower extremity edema ABDOMEN:abdomen soft, non-tender and normal bowel sounds.  Patient is not ready for rectal exam. Musculoskeletal:no cyanosis of digits and no clubbing  NEURO: alert & oriented x 3 with fluent speech, no focal motor/sensory deficits  Physical Exam    LABORATORY DATA:  I have reviewed  the data as listed    Latest Ref Rng & Units 11/19/2023    9:28 AM 10/29/2023    3:16 AM 10/28/2023    3:20 AM  CBC  WBC 4.0 - 10.5 K/uL 8.5  5.2  4.9   Hemoglobin 12.0 - 15.0 g/dL 89.9  5.8  6.1   Hematocrit 36.0 - 46.0 % 32.3  18.6  19.6   Platelets 150 - 400 K/uL 430  82  79         Latest Ref Rng & Units 11/19/2023    9:28 AM 10/29/2023    3:16 AM 10/28/2023    3:20 AM  CMP  Glucose 70 - 99 mg/dL 875  897  895   BUN  8 - 23 mg/dL 9  11  11    Creatinine 0.44 - 1.00 mg/dL 8.86  9.23  9.15   Sodium 135 - 145 mmol/L 138  136  133   Potassium 3.5 - 5.1 mmol/L 3.5  3.3  3.6   Chloride 98 - 111 mmol/L 107  108  106   CO2 22 - 32 mmol/L 23  18  17    Calcium  8.9 - 10.3 mg/dL 9.1  8.3  8.5   Total Protein 6.5 - 8.1 g/dL 6.7     Total Bilirubin 0.0 - 1.2 mg/dL 0.3     Alkaline Phos 38 - 126 U/L 89     AST 15 - 41 U/L 15     ALT 0 - 44 U/L 9         RADIOGRAPHIC STUDIES: I have personally reviewed the radiological images as listed and agreed with the findings in the report. No results found.    Orders Placed This Encounter  Procedures   NM PET Image Restag (PS) Skull Base To Thigh    Standing Status:   Future    Expected Date:   01/19/2024    Expiration Date:   11/18/2024    If indicated for the ordered procedure, I authorize the administration of a radiopharmaceutical per Radiology protocol:   Yes    Preferred imaging location?:   [facility]   All questions were answered. The patient knows to call the clinic with any problems, questions or concerns. No barriers to learning was detected. The total time spent in the appointment was 25 minutes, including review of chart and various tests results, discussions about plan of care and coordination of care plan     Onita Mattock, MD 11/19/2023

## 2023-11-20 ENCOUNTER — Ambulatory Visit: Payer: Medicare (Managed Care) | Admitting: Hematology

## 2023-11-20 ENCOUNTER — Other Ambulatory Visit: Payer: Medicare (Managed Care)

## 2023-11-20 ENCOUNTER — Other Ambulatory Visit: Payer: Self-pay

## 2023-11-25 ENCOUNTER — Ambulatory Visit: Payer: Medicare (Managed Care) | Admitting: Physical Therapy

## 2023-11-27 ENCOUNTER — Encounter: Payer: Self-pay | Admitting: Hematology

## 2023-11-28 ENCOUNTER — Other Ambulatory Visit: Payer: Self-pay | Admitting: Pulmonary Disease

## 2023-12-01 ENCOUNTER — Other Ambulatory Visit: Payer: Self-pay | Admitting: Nurse Practitioner

## 2023-12-01 ENCOUNTER — Ambulatory Visit
Admission: RE | Admit: 2023-12-01 | Discharge: 2023-12-01 | Disposition: A | Discharge status: Home / Self Care | Payer: Medicare (Managed Care) | Source: Ambulatory Visit | Attending: Radiation Oncology | Admitting: Radiation Oncology

## 2023-12-01 DIAGNOSIS — C210 Malignant neoplasm of anus, unspecified: Secondary | ICD-10-CM | POA: Insufficient documentation

## 2023-12-01 MED ORDER — APIXABAN (ELIQUIS) VTE STARTER PACK (10MG AND 5MG): ORAL_TABLET | ORAL | 0 refills | Status: DC

## 2023-12-01 NOTE — Progress Notes (Signed)
  Radiation Oncology         [phone] ________________________________  Name: Misty [name] MRN: [id number]  Date of Service: 12/01/2023  DOB: 11-16-1955  Post Treatment Telephone Note  Diagnosis:  C21.0 Malignant neoplasm of anus, unspecified.  Radiation treatment dates:   09/15/23-10/24/23    The patient was available for call today.   Symptoms of fatigue have not improved since completing therapy.  Symptoms of skin changes have improved since completing therapy. Still feels raw and using Silvadene  cream.  Symptoms of bladder changes have not improved since completing therapy.  She reports having a catheter. Symptoms of bowel changes have not improved since completing therapy. She reports having constipation.  The patient has a scheduled follow up with her medical oncologist Dr. Lanny and their surgeon Dr.Thomas for ongoing surveillance. She was encouraged to call if she develops concerns or questions regarding radiation.   No vitals were taken for this visit.

## 2023-12-16 ENCOUNTER — Encounter: Payer: Self-pay | Admitting: Hematology

## 2023-12-22 ENCOUNTER — Encounter: Payer: Self-pay | Admitting: Hematology

## 2023-12-22 ENCOUNTER — Other Ambulatory Visit: Payer: Self-pay | Admitting: Nurse Practitioner

## 2023-12-22 DIAGNOSIS — C210 Malignant neoplasm of anus, unspecified: Secondary | ICD-10-CM

## 2023-12-22 NOTE — Progress Notes (Unsigned)
 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  Biopsy confirmed squamous cell carcinoma. - Staging PET scan showed intense of hypermetabolic activity extending from the anus into the rectum, and the small bilateral inguinal lymph nodes with low-level activity.  No evidence of distant metastasis. -She started concurrent chemoradiation with mitomycin  and 5-FU on September 15, 2023. -she was hospitalized for PE, neutropenic fever and anemia on 10/22/23, last two doses RT was cancelled.  -12/23/2023 - on eliquis  BID due to recent PE. Will soon start PT, OT, and speech therapy. Follow up with Dr. [name] next week. PET scan should be scheduled for the end of October 2025.     Anemia Stable with no requirement for blood or iron infusions today. Continue to monitor with every visit.   PE Currently on eliquis  5 mg twice daily. Will continue for, at least, 6 months of uninterrupted use. Will refill and send prescription to mail order pharmacy.   Anal cancer stage IIIC Completed chemotherapy and radiation in 09/2023. Continues to have moderate pain in the anal/rectal area. Using a sciatica pillow for comfort. Not driving because of increased pain when using the brakes. She is scheduled for follow up exam next week with Dr. [name]. Will have PET scan scheduled at end of October for restaging/surveillance.   Constipation The patient has mild constipation. Will go about 3 days in between bowel movements. Using mild stool softener. Bowel movements are soft, though still  painful. Denies presence of blood in her stool. Recommend she continue to drink plenty of water. Continue to use stool softener daily. If needed, use miralax  for more severe constipation.   Urinary incontinence Was able to have indwelling Foley catheter removed earlier this month. States that she is still wearing Depends, as she has some, less frequent, episodes of incontinence. This is gradually improving. She will continue to follow up with urology.   Plan Labs reviewed. -mild and stable anemia. -mild and improving dehydration. Encouraged increased water intake. Recheck 01/19/2024 as scheduled.  Follow up with Dr. [name] next week.  PET scan ordered and should be scheduled for end of October. Labs and follow up as scheduled.   The patient understands the plans discussed today and is in agreement with them.  She knows to contact our office if she develops concerns prior to her next appointment.  I provided 25 minutes of face-to-face time during this encounter and > 50% was spent counseling as documented under my assessment and plan.    Powell FORBES Lessen, NP  [facility] - A DEPT OF [facility] [address] [place] [address] Dept: [phone] Dept Fax: [phone]   No orders of the defined types were placed in this encounter.     CHIEF COMPLAINT:  CC: Anal cancer  Current Treatment: Cancer surveillance  INTERVAL HISTORY:  Misty [name] is here today for repeat clinical assessment.  She last saw Dr. Lanny on 11/19/2023. States that she has been home, in her apartment for past 2 weeks. Gradually improving  in independence. States that PT, OT, and speech therapy should start soon. Services were just approved through her insurance. Continues to have moderate anal/rectal pain. This is improving.  She denies chest pain, chest pressure, or shortness of breath. She denies headaches or visual disturbances. She denies abdominal  pain, nausea, vomiting, or changes in bowel or bladder habits. Continues to have constipation. Now taking colace stool softener daily. She denies fevers or chills. Her appetite is good. Her weight has increased 4 pounds over last month.  I have reviewed the past medical history, past surgical history, social history and family history with the patient and they are unchanged from previous note.  ALLERGIES:  is allergic to codeine.  MEDICATIONS:  Current Outpatient Medications  Medication Sig Dispense Refill   acetaminophen  (TYLENOL ) 325 MG tablet Take 2 tablets (650 mg total) by mouth every 6 (six) hours as needed for mild pain (pain score 1-3) or fever (or Fever >/= 101). 30 tablet 0   albuterol  (VENTOLIN  HFA) 108 (90 Base) MCG/ACT inhaler Inhale 2 puffs into the lungs every 6 (six) hours as needed for wheezing or shortness of breath. 1 each 3   apixaban  (ELIQUIS ) 5 MG TABS tablet Take 1 tablet (5 mg total) by mouth 2 (two) times daily. 180 tablet 1   atorvastatin  (LIPITOR) 20 MG tablet Take 20 mg by mouth daily.   3   baclofen  (LIORESAL ) 10 MG tablet Take 1 tablet (10 mg total) by mouth 2 (two) times daily as needed for muscle spasms. 30 each 0   buPROPion  (WELLBUTRIN  XL) 150 MG 24 hr tablet Take 450 mg by mouth every morning.     butalbital -acetaminophen -caffeine  (FIORICET) 50-325-40 MG tablet Take 1 tablet by mouth every 4 (four) hours as needed for headache. 14 tablet 0   diazepam  (VALIUM ) 5 MG tablet Take 1 tablet (5 mg total) by mouth 2 (two) times daily as needed for anxiety. 30 tablet 0   Fluticasone -Umeclidin-Vilant (TRELEGY ELLIPTA ) 100-62.5-25 MCG/ACT AEPB USE 1 INHALATION DAILY (NEED APPOINTMENT FOR FURTHER REFILLS) 180 each 0   gabapentin  (NEURONTIN ) 600 MG tablet Take 600 mg by mouth 2 (two) times daily. Take 0.5 tablet (300 mg) in the morning and Take 1.5 (900 mg) in the evening     guaiFENesin  (MUCINEX ) 600 MG 12 hr tablet Take 600 mg by mouth 2 (two) times daily as needed for  cough.     ipratropium-albuterol  (DUONEB) 0.5-2.5 (3) MG/3ML SOLN Take 3 mLs by nebulization every 4 (four) hours as needed. (Patient taking differently: Take 3 mLs by nebulization every 4 (four) hours as needed (wheezing / shortness of breath).) 360 mL 1   lamoTRIgine  (LAMICTAL ) 200 MG tablet Take 1 tablet (200 mg total) by mouth at bedtime. 30 tablet 0   levothyroxine  (SYNTHROID , LEVOTHROID) 75 MCG tablet Take 0.75 mcg by mouth daily.     magic mouthwash (nystatin , lidocaine , diphenhydrAMINE, alum & mag hydroxide) suspension Swish and swallow 5 mLs by mouth 3 (three) times daily as needed for mouth pain. 140 mL 1   Multiple Vitamins-Minerals (CENTRUM ADULT PO) Take 1 tablet by mouth daily at 12 noon.     polyethylene glycol (MIRALAX ) 17 g packet Take 17 g by mouth daily as needed for mild constipation, moderate constipation or severe constipation.     valACYclovir  (VALTREX ) 500 MG tablet Take 500 mg by mouth daily.     No current facility-administered medications for this visit.    HISTORY OF PRESENT ILLNESS:   Oncology History  Squamous  cell carcinoma of anus (HCC)  08/26/2023 Initial Diagnosis   Squamous cell carcinoma of anus (HCC)   09/09/2023 Cancer Staging   Staging form: Anus, AJCC 8th Edition - Clinical: Stage IIIC (cT3, cN1a, cM0) - Signed by [name], MD on 09/09/2023   09/15/2023 -  Chemotherapy   Patient is on Treatment Plan : ANUS Mitomycin  D1,28 + 5FU D1-4, 28-31 q32d         REVIEW OF SYSTEMS:   Constitutional: Denies fevers, chills or abnormal weight loss Eyes: Denies blurriness of vision Ears, nose, mouth, throat, and face: Denies mucositis or sore throat Respiratory: Denies cough, dyspnea or wheezes Cardiovascular: Denies palpitation, chest discomfort or lower extremity swelling Gastrointestinal:  Denies nausea, heartburn or change in bowel habits. Moderate constipation.  Skin: Denies abnormal skin rashes Lymphatics: Denies new lymphadenopathy or easy  bruising Neurological:Denies numbness, tingling or new weaknesses Behavioral/Psych: Mood is stable, no new changes  All other systems were reviewed with the patient and are negative.   VITALS:   Today's Vitals   12/23/23 0846 12/23/23 0854  BP: 130/60   Pulse: (!) 108   Resp: 17   Temp: 97.8 F (36.6 C)   SpO2: 97%   Weight: 148 lb 14.4 oz (67.5 kg)   PainSc:  0-No pain   Body mass index is 26.38 kg/m.   Wt Readings from Last 3 Encounters:  12/23/23 148 lb 14.4 oz (67.5 kg)  11/19/23 144 lb 11.2 oz (65.6 kg)  10/26/23 159 lb 13.3 oz (72.5 kg)    Body mass index is 26.38 kg/m.  Performance status (ECOG): 1 - Symptomatic but completely ambulatory  PHYSICAL EXAM:   GENERAL:alert, no distress and comfortable SKIN: skin color, texture, turgor are normal, no rashes or significant lesions EYES: normal, Conjunctiva are pink and non-injected, sclera clear OROPHARYNX:no exudate, no erythema and lips, buccal mucosa, and tongue normal  NECK: supple, thyroid normal size, non-tender, without nodularity LYMPH:  no palpable lymphadenopathy in the cervical, axillary or inguinal LUNGS: clear to auscultation and percussion with normal breathing effort HEART: regular rate & rhythm and no murmurs and no lower extremity edema ABDOMEN:abdomen soft, non-tender and normal bowel sounds Musculoskeletal:no cyanosis of digits and no clubbing  NEURO: alert & oriented x 3 with fluent speech, no focal motor/sensory deficits  LABORATORY DATA:  I have reviewed the data as listed    Component Value Date/Time   NA 141 12/23/2023 0820   K 3.9 12/23/2023 0820   CL 108 12/23/2023 0820   CO2 27 12/23/2023 0820   GLUCOSE 108 (H) 12/23/2023 0820   BUN 16 12/23/2023 0820   CREATININE 1.07 (H) 12/23/2023 0820   CALCIUM  9.6 12/23/2023 0820   PROT 6.5 12/23/2023 0820   ALBUMIN 3.8 12/23/2023 0820   AST 18 12/23/2023 0820   ALT 15 12/23/2023 0820   ALKPHOS 68 12/23/2023 0820   BILITOT 0.3 12/23/2023  0820   GFRNONAA 57 (L) 12/23/2023 0820   GFRAA >60 10/10/2019 0645    Lab Results  Component Value Date   WBC 7.1 12/23/2023   NEUTROABS 5.3 12/23/2023   HGB 10.9 (L) 12/23/2023   HCT 34.9 (L) 12/23/2023   MCV 101.7 (H) 12/23/2023   PLT 281 12/23/2023

## 2023-12-22 NOTE — Assessment & Plan Note (Signed)
 cT3N1M0, stage IIIC -Presented with rectal pain and colonoscopy showed a 3 cm mass in the anus.  Biopsy confirmed squamous cell carcinoma. - Staging PET scan showed intense of hypermetabolic activity extending from the anus into the rectum, and the small bilateral inguinal lymph nodes with low-level activity.  No evidence of distant metastasis. -She started concurrent chemoradiation with mitomycin  and 5-FU on September 15, 2023. -she was hospitalized for PE, neutropenic fever and anemia on 10/22/23, last two doses RT was cancelled.  -12/23/2023 - on eliquis  BID due to recent PE. Will soon start PT, OT, and speech therapy. Follow up with Dr. [name] next week. PET scan should be scheduled for the end of October 2025.

## 2023-12-23 ENCOUNTER — Encounter: Payer: Self-pay | Admitting: Nurse Practitioner

## 2023-12-23 ENCOUNTER — Inpatient Hospital Stay (HOSPITAL_BASED_OUTPATIENT_CLINIC_OR_DEPARTMENT_OTHER): Payer: Medicare (Managed Care) | Admitting: Nurse Practitioner

## 2023-12-23 ENCOUNTER — Other Ambulatory Visit: Payer: Self-pay

## 2023-12-23 ENCOUNTER — Inpatient Hospital Stay: Payer: Medicare (Managed Care) | Attending: Nurse Practitioner

## 2023-12-23 VITALS — BP 130/60 | HR 108 | Temp 97.8°F | Resp 17 | Wt 148.9 lb

## 2023-12-23 DIAGNOSIS — Z79899 Other long term (current) drug therapy: Secondary | ICD-10-CM | POA: Diagnosis not present

## 2023-12-23 DIAGNOSIS — D649 Anemia, unspecified: Secondary | ICD-10-CM | POA: Diagnosis not present

## 2023-12-23 DIAGNOSIS — Z86711 Personal history of pulmonary embolism: Secondary | ICD-10-CM | POA: Diagnosis not present

## 2023-12-23 DIAGNOSIS — Z79624 Long term (current) use of inhibitors of nucleotide synthesis: Secondary | ICD-10-CM | POA: Diagnosis not present

## 2023-12-23 DIAGNOSIS — C210 Malignant neoplasm of anus, unspecified: Secondary | ICD-10-CM | POA: Diagnosis present

## 2023-12-23 DIAGNOSIS — Z923 Personal history of irradiation: Secondary | ICD-10-CM | POA: Insufficient documentation

## 2023-12-23 DIAGNOSIS — Z9221 Personal history of antineoplastic chemotherapy: Secondary | ICD-10-CM | POA: Diagnosis not present

## 2023-12-23 DIAGNOSIS — Z7901 Long term (current) use of anticoagulants: Secondary | ICD-10-CM | POA: Diagnosis not present

## 2023-12-23 LAB — CMP (CANCER CENTER ONLY)
ALT: 15 U/L (ref 0–44)
AST: 18 U/L (ref 15–41)
Albumin: 3.8 g/dL (ref 3.5–5.0)
Alkaline Phosphatase: 68 U/L (ref 38–126)
Anion gap: 6 (ref 5–15)
BUN: 16 mg/dL (ref 8–23)
CO2: 27 mmol/L (ref 22–32)
Calcium: 9.6 mg/dL (ref 8.9–10.3)
Chloride: 108 mmol/L (ref 98–111)
Creatinine: 1.07 mg/dL — ABNORMAL HIGH (ref 0.44–1.00)
GFR, Estimated: 57 mL/min — ABNORMAL LOW (ref >60)
Glucose, Bld: 108 mg/dL — ABNORMAL HIGH (ref 70–99)
Potassium: 3.9 mmol/L (ref 3.5–5.1)
Sodium: 141 mmol/L (ref 135–145)
Total Bilirubin: 0.3 mg/dL (ref 0.0–1.2)
Total Protein: 6.5 g/dL (ref 6.5–8.1)

## 2023-12-23 LAB — CBC WITH DIFFERENTIAL (CANCER CENTER ONLY)
Abs Immature Granulocytes: 0.08 K/uL — ABNORMAL HIGH (ref 0.00–0.07)
Basophils Absolute: 0.1 K/uL (ref 0.0–0.1)
Basophils Relative: 1 %
Eosinophils Absolute: 0.4 K/uL (ref 0.0–0.5)
Eosinophils Relative: 6 %
HCT: 34.9 % — ABNORMAL LOW (ref 36.0–46.0)
Hemoglobin: 10.9 g/dL — ABNORMAL LOW (ref 12.0–15.0)
Immature Granulocytes: 1 %
Lymphocytes Relative: 7 %
Lymphs Abs: 0.5 K/uL — ABNORMAL LOW (ref 0.7–4.0)
MCH: 31.8 pg (ref 26.0–34.0)
MCHC: 31.2 g/dL (ref 30.0–36.0)
MCV: 101.7 fL — ABNORMAL HIGH (ref 80.0–100.0)
Monocytes Absolute: 0.8 K/uL (ref 0.1–1.0)
Monocytes Relative: 11 %
Neutro Abs: 5.3 K/uL (ref 1.7–7.7)
Neutrophils Relative %: 74 %
Platelet Count: 281 K/uL (ref 150–400)
RBC: 3.43 MIL/uL — ABNORMAL LOW (ref 3.87–5.11)
RDW: 14 % (ref 11.5–15.5)
WBC Count: 7.1 K/uL (ref 4.0–10.5)
nRBC: 0 % (ref 0.0–0.2)

## 2023-12-23 MED ORDER — APIXABAN 5 MG PO TABS: 5.0000 mg | ORAL_TABLET | Freq: Two times a day (BID) | ORAL | 1 refills | Status: AC

## 2023-12-30 ENCOUNTER — Ambulatory Visit: Payer: Medicare (Managed Care) | Admitting: Pulmonary Disease

## 2024-01-19 ENCOUNTER — Encounter (HOSPITAL_COMMUNITY)
Admission: RE | Admit: 2024-01-19 | Discharge: 2024-01-19 | Disposition: A | Discharge status: Home / Self Care | Payer: Medicare (Managed Care) | Source: Ambulatory Visit | Attending: Hematology | Admitting: Hematology

## 2024-01-19 ENCOUNTER — Inpatient Hospital Stay: Payer: Medicare (Managed Care) | Attending: Nurse Practitioner

## 2024-01-19 DIAGNOSIS — C210 Malignant neoplasm of anus, unspecified: Secondary | ICD-10-CM | POA: Insufficient documentation

## 2024-01-19 LAB — CMP (CANCER CENTER ONLY)
ALT: 16 U/L (ref 0–44)
AST: 21 U/L (ref 15–41)
Albumin: 4 g/dL (ref 3.5–5.0)
Alkaline Phosphatase: 75 U/L (ref 38–126)
Anion gap: 5 (ref 5–15)
BUN: 18 mg/dL (ref 8–23)
CO2: 24 mmol/L (ref 22–32)
Calcium: 9.5 mg/dL (ref 8.9–10.3)
Chloride: 106 mmol/L (ref 98–111)
Creatinine: 1.08 mg/dL — ABNORMAL HIGH (ref 0.44–1.00)
GFR, Estimated: 56 mL/min — ABNORMAL LOW (ref >60)
Glucose, Bld: 89 mg/dL (ref 70–99)
Potassium: 4 mmol/L (ref 3.5–5.1)
Sodium: 135 mmol/L (ref 135–145)
Total Bilirubin: 0.3 mg/dL (ref 0.0–1.2)
Total Protein: 6.8 g/dL (ref 6.5–8.1)

## 2024-01-19 LAB — GLUCOSE, CAPILLARY: Glucose-Capillary: 97 mg/dL (ref 70–99)

## 2024-01-19 LAB — CBC WITH DIFFERENTIAL (CANCER CENTER ONLY)
Abs Immature Granulocytes: 0.05 K/uL (ref 0.00–0.07)
Basophils Absolute: 0 K/uL (ref 0.0–0.1)
Basophils Relative: 1 %
Eosinophils Absolute: 0.1 K/uL (ref 0.0–0.5)
Eosinophils Relative: 2 %
HCT: 34.3 % — ABNORMAL LOW (ref 36.0–46.0)
Hemoglobin: 11.1 g/dL — ABNORMAL LOW (ref 12.0–15.0)
Immature Granulocytes: 1 %
Lymphocytes Relative: 10 %
Lymphs Abs: 0.6 K/uL — ABNORMAL LOW (ref 0.7–4.0)
MCH: 32.1 pg (ref 26.0–34.0)
MCHC: 32.4 g/dL (ref 30.0–36.0)
MCV: 99.1 fL (ref 80.0–100.0)
Monocytes Absolute: 0.8 K/uL (ref 0.1–1.0)
Monocytes Relative: 13 %
Neutro Abs: 4.5 K/uL (ref 1.7–7.7)
Neutrophils Relative %: 73 %
Platelet Count: 265 K/uL (ref 150–400)
RBC: 3.46 MIL/uL — ABNORMAL LOW (ref 3.87–5.11)
RDW: 13.2 % (ref 11.5–15.5)
WBC Count: 6 K/uL (ref 4.0–10.5)
nRBC: 0 % (ref 0.0–0.2)

## 2024-01-19 MED ORDER — FLUDEOXYGLUCOSE F - 18 (FDG) INJECTION
6.9740 | Freq: Once | INTRAVENOUS | Status: AC
Start: 2024-01-19 — End: 2024-01-19
  Administered 2024-01-19: 6.974 via INTRAVENOUS

## 2024-01-26 ENCOUNTER — Inpatient Hospital Stay: Payer: Medicare (Managed Care) | Attending: Nurse Practitioner | Admitting: Hematology

## 2024-01-26 VITALS — BP 130/64 | HR 88 | Temp 98.0°F | Resp 16 | Ht 63.0 in | Wt 152.3 lb

## 2024-01-26 DIAGNOSIS — C210 Malignant neoplasm of anus, unspecified: Secondary | ICD-10-CM | POA: Diagnosis not present

## 2024-01-26 NOTE — Progress Notes (Signed)
 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  Biopsy confirmed squamous cell carcinoma. - Staging PET scan showed intense of hypermetabolic activity extending from the anus into the rectum, and the small bilateral inguinal lymph nodes with low-level activity.  No evidence of distant metastasis. -She started concurrent chemoradiation with mitomycin  and 5-FU on September 15, 2023 and completed on 10/24/2023 -she was hospitalized for PE, neutropenic fever and anemia on 10/22/23, last two doses RT was cancelled.  - Endoscopic exam by Dr. [name] on December 31, 2023 was negative for residual disease -Restaging PET scan on January 19, 2024 showed a near complete response with residual SUV 4.4 at the anal canal, probably related to treatment.  Assessment & Plan History of anal cancer with post-chemoradiation  Near complete response to chemoradiation with no evidence of residual cancer on recent exam and PET scan. PET scan shows some uptake in the rectal area which is likely due to radiation related inflammation. Dr. [name] did anoscope 1 months ago which showed no evidence of residual disease, but was concerning for rectovaginal fistula (she is not symptomatic).  - Labs reviewed.  Blood counts have improved and are close to normal. - Continue follow-up with Dr. [name] every three months for rectal exams. - Will order CT scan in six months to monitor  for any masses or enlarged lymph nodes. - Monitor for signs of rectovaginal fistula, such as stool discharge from the vagina.  Bowel habit changes after chemoradiation (constipation and diarrhea) Bowel habit changes characterized by alternating constipation and diarrhea, likely due to post-chemoradiation inflammation. Constipation managed with Miralax , but diarrhea occurs with excessive use. Inflammation in the rectal area may contribute to symptoms. - Use full dose of Miralax  as needed for constipation. - Consider probiotics or yogurt to regulate bowel habits. - Increase intake of vegetable fluids to aid bowel movements.  Dehydration and dry mouth, likely medication-related Dehydration and dry mouth likely related to medication use. Reports difficulty maintaining hydration despite efforts. - Continue to increase fluid intake to manage dehydration and dry mouth.  Plan - She has recovered well overall - I reviewed her PET scan images and discussed the findings with her.  She has achieved complete response. - Will continue disease monitoring.  She will see Dr. [name] every 3 months, I will see her back in 3 to 4 months, will order surveillance CT scan on next visit.   SUMMARY OF ONCOLOGIC HISTORY: Oncology History  Squamous cell carcinoma of anus (HCC)  08/26/2023 Initial Diagnosis   Squamous cell carcinoma of anus (HCC)   09/09/2023 Cancer Staging   Staging form: Anus, AJCC 8th Edition - Clinical: Stage IIIC (cT3, cN1a, cM0) - Signed by [name], MD on 09/09/2023   09/15/2023 -  Chemotherapy   Patient is on Treatment Plan : ANUS Mitomycin  D1,28 + 5FU D1-4, 28-31 q32d        Discussed the use of AI scribe software for clinical note transcription  with the patient, who gave verbal consent to proceed.  History of Present Illness Misty [name] is a 68 year old female with a history of rectal cancer who presents for follow-up. She is accompanied by her daughter.  She has a  persistent lump on the rim of her anus, which has not improved with daily rectal care. The lump was previously noted but not addressed due to inflammation. She experiences a brown discharge from her anus, resembling a 'worm' about two inches long, occurring a couple of times a day, especially when constipated. She manages constipation with stool softeners, prune juice, and Miralax , being cautious with Miralax  to avoid painful diarrhea. She is concerned about a rectovaginal fistula but has not experienced stool passing through the vagina. Her bowel habits have improved recently, with no current constipation. She maintains hydration and fiber intake to manage bowel symptoms. No fatigue beyond her baseline level.     All other systems were reviewed with the patient and are negative.  MEDICAL HISTORY:  Past Medical History:  Diagnosis Date   Anxiety    Bipolar disorder (HCC)    Dr. Vincente   COPD (chronic obstructive pulmonary disease) (HCC) 2006   Depression    Hypothyroidism    Rheumatic fever    as a child, age 89yo   Urinary tract infection    hx/o   Wears glasses     SURGICAL HISTORY: Past Surgical History:  Procedure Laterality Date   ABDOMINAL HYSTERECTOMY     APPENDECTOMY     CESAREAN SECTION     UTERINE FIBROID SURGERY      I have reviewed the social history and family history with the patient and they are unchanged from previous note.  ALLERGIES:  is allergic to codeine.  MEDICATIONS:  Current Outpatient Medications  Medication Sig Dispense Refill   acetaminophen  (TYLENOL ) 325 MG tablet Take 2 tablets (650 mg total) by mouth every 6 (six) hours as needed for mild pain (pain score 1-3) or fever (or Fever >/= 101). 30 tablet 0   albuterol  (VENTOLIN  HFA) 108 (90 Base) MCG/ACT inhaler Inhale 2 puffs into the lungs every 6 (six) hours as needed for wheezing or shortness of breath. 1 each 3   apixaban  (ELIQUIS ) 5 MG TABS tablet Take 1 tablet (5 mg total) by mouth 2 (two) times  daily. 180 tablet 1   atorvastatin  (LIPITOR) 20 MG tablet Take 20 mg by mouth daily.   3   baclofen  (LIORESAL ) 10 MG tablet Take 1 tablet (10 mg total) by mouth 2 (two) times daily as needed for muscle spasms. 30 each 0   buPROPion  (WELLBUTRIN  XL) 150 MG 24 hr tablet Take 450 mg by mouth every morning.     butalbital -acetaminophen -caffeine  (FIORICET) 50-325-40 MG tablet Take 1 tablet by mouth every 4 (four) hours as needed for headache. 14 tablet 0   diazepam  (VALIUM ) 5 MG tablet Take 1 tablet (5 mg total) by mouth 2 (two) times daily as needed for anxiety. 30 tablet 0   Fluticasone -Umeclidin-Vilant (TRELEGY ELLIPTA ) 100-62.5-25 MCG/ACT AEPB USE 1 INHALATION DAILY (NEED APPOINTMENT FOR FURTHER REFILLS) 180 each 0   gabapentin  (NEURONTIN ) 600 MG tablet Take 600 mg by mouth 2 (two) times daily. Take 0.5 tablet (300 mg) in the morning and Take 1.5 (900 mg) in the evening     guaiFENesin  (MUCINEX ) 600 MG 12 hr tablet Take 600 mg by mouth 2 (two) times daily as needed for cough.     ipratropium-albuterol  (DUONEB) 0.5-2.5 (3)  MG/3ML SOLN Take 3 mLs by nebulization every 4 (four) hours as needed. (Patient taking differently: Take 3 mLs by nebulization every 4 (four) hours as needed (wheezing / shortness of breath).) 360 mL 1   lamoTRIgine  (LAMICTAL ) 200 MG tablet Take 1 tablet (200 mg total) by mouth at bedtime. 30 tablet 0   levothyroxine  (SYNTHROID , LEVOTHROID) 75 MCG tablet Take 0.75 mcg by mouth daily.     magic mouthwash (nystatin , lidocaine , diphenhydrAMINE, alum & mag hydroxide) suspension Swish and swallow 5 mLs by mouth 3 (three) times daily as needed for mouth pain. 140 mL 1   Multiple Vitamins-Minerals (CENTRUM ADULT PO) Take 1 tablet by mouth daily at 12 noon.     polyethylene glycol (MIRALAX ) 17 g packet Take 17 g by mouth daily as needed for mild constipation, moderate constipation or severe constipation.     valACYclovir  (VALTREX ) 500 MG tablet Take 500 mg by mouth daily.     No current  facility-administered medications for this visit.    PHYSICAL EXAMINATION: ECOG PERFORMANCE STATUS: 1 - Symptomatic but completely ambulatory  Vitals:   01/26/24 1339  BP: 130/64  Pulse: 88  Resp: 16  Temp: 98 F (36.7 C)  SpO2: 100%   Wt Readings from Last 3 Encounters:  01/26/24 152 lb 4.8 oz (69.1 kg)  12/23/23 148 lb 14.4 oz (67.5 kg)  11/19/23 144 lb 11.2 oz (65.6 kg)     GENERAL:alert, no distress and comfortable SKIN: skin color, texture, turgor are normal, no rashes or significant lesions EYES: normal, Conjunctiva are pink and non-injected, sclera clear NECK: supple, thyroid normal size, non-tender, without nodularity LYMPH:  no palpable lymphadenopathy in the cervical, axillary  LUNGS: clear to auscultation and percussion with normal breathing effort HEART: regular rate & rhythm and no murmurs and no lower extremity edema ABDOMEN:abdomen soft, non-tender and normal bowel sounds Musculoskeletal:no cyanosis of digits and no clubbing  NEURO: alert & oriented x 3 with fluent speech, no focal motor/sensory deficits RECTAL: No external anal lesions. Possible scar tissue palpable 2-3 cm inside rectum. Physical Exam   LABORATORY DATA:  I have reviewed the data as listed    Latest Ref Rng & Units 01/19/2024   12:59 PM 12/23/2023    8:20 AM 11/19/2023    9:28 AM  CBC  WBC 4.0 - 10.5 K/uL 6.0  7.1  8.5   Hemoglobin 12.0 - 15.0 g/dL 88.8  89.0  89.9   Hematocrit 36.0 - 46.0 % 34.3  34.9  32.3   Platelets 150 - 400 K/uL 265  281  430         Latest Ref Rng & Units 01/19/2024   12:59 PM 12/23/2023    8:20 AM 11/19/2023    9:28 AM  CMP  Glucose 70 - 99 mg/dL 89  891  875   BUN 8 - 23 mg/dL 18  16  9    Creatinine 0.44 - 1.00 mg/dL 8.91  8.92  8.86   Sodium 135 - 145 mmol/L 135  141  138   Potassium 3.5 - 5.1 mmol/L 4.0  3.9  3.5   Chloride 98 - 111 mmol/L 106  108  107   CO2 22 - 32 mmol/L 24  27  23    Calcium  8.9 - 10.3 mg/dL 9.5  9.6  9.1   Total Protein 6.5 -  8.1 g/dL 6.8  6.5  6.7   Total Bilirubin 0.0 - 1.2 mg/dL 0.3  0.3  0.3   Alkaline Phos 38 -  126 U/L 75  68  89   AST 15 - 41 U/L 21  18  15    ALT 0 - 44 U/L 16  15  9        RADIOGRAPHIC STUDIES: I have personally reviewed the radiological images as listed and agreed with the findings in the report. No results found.    No orders of the defined types were placed in this encounter.  All questions were answered. The patient knows to call the clinic with any problems, questions or concerns. No barriers to learning was detected. The total time spent in the appointment was 30 minutes, including review of chart and various tests results, discussions about plan of care and coordination of care plan     Onita Mattock, MD 01/26/2024

## 2024-01-26 NOTE — Assessment & Plan Note (Addendum)
 cT3N1M0, stage IIIC -Presented with rectal pain and colonoscopy showed a 3 cm mass in the anus.  Biopsy confirmed squamous cell carcinoma. - Staging PET scan showed intense of hypermetabolic activity extending from the anus into the rectum, and the small bilateral inguinal lymph nodes with low-level activity.  No evidence of distant metastasis. -She started concurrent chemoradiation with mitomycin  and 5-FU on September 15, 2023 and completed on 10/24/2023 -she was hospitalized for PE, neutropenic fever and anemia on 10/22/23, last two doses RT was cancelled.  - Endoscopic exam by Dr. [name] on December 31, 2023 was negative for residual disease -Restaging PET scan on January 19, 2024 showed a near complete response with residual SUV 4.4 at the anal canal, probably related to treatment.

## 2024-01-27 ENCOUNTER — Other Ambulatory Visit: Payer: Self-pay

## 2024-02-02 ENCOUNTER — Encounter: Payer: Self-pay | Admitting: Hematology

## 2024-02-04 ENCOUNTER — Encounter: Payer: Self-pay | Admitting: Pulmonary Disease

## 2024-02-04 ENCOUNTER — Ambulatory Visit: Payer: Medicare (Managed Care) | Admitting: Pulmonary Disease

## 2024-02-04 VITALS — BP 104/58 | HR 93 | Ht 63.0 in | Wt 148.9 lb

## 2024-02-04 DIAGNOSIS — J302 Other seasonal allergic rhinitis: Secondary | ICD-10-CM | POA: Diagnosis not present

## 2024-02-04 DIAGNOSIS — J449 Chronic obstructive pulmonary disease, unspecified: Secondary | ICD-10-CM | POA: Diagnosis not present

## 2024-02-04 NOTE — Progress Notes (Signed)
 @Patient  ID: Misty [name], female    DOB: [birth year], 68 y.o.   MRN: [id number]  Chief Complaint  Patient presents with   Medical Management of Chronic Issues    Referring provider: Beam, [name], MD  HPI:  Misty [name] is a 68 y.o. woman with COPD (fev1 58% 2015) whom we are seeing in follow up for COPD.  Multiple oncology notes reviewed.  Discharge summary 10/2023 reviewed.  Diagnosed with colorectal cancer in interim.  Underwent radiation and chemotherapy.  Was hospitalized late 09/2023 with confusion.  She states related to Dilaudid , new pain medicine.  Diagnosed with PE.  Due to low blood counts really was hard to tolerate anticoagulation.  He states he has been on anticoagulation now for the last month.  Doing well.  Increasing exercise tolerance.  Getting stronger.  In good spirits.  Using Trelegy regularly.  Discontinued to help.  Stopped montelukast  given concerns for possible hallucinations, contributing hallucinations in the future.  HPI at initial visit: She was admitted 7/16 to 7/18 with sudden onset severe shortness of breath and DOE. Increase cough with sputum as well. Some mild wheezing per patient. Had been on prednisone  and antibiotic the week before as outpatient but failed to significantly improve and subsequently worsened shortly after steroid burst. This prompted presentation to ED. ED notes reviewed. CXR on admission reviewed and interpreted as clear lungs, hyperinflated, L hemidiaphragm elevated.  She was found to be hypoxemic requiring 2L [place]. She was admitted and received IV then oral steroids and oral antibiotics. She improved and was discharged on longer steroid taper.   She reports slow improvement though not back to baseline. She is encouraged with improvement and expects to get back to normal. She is using Wixela BID. Was on symbicort  in past. Switched due to insurance. She is not sure if Napoleon is helping. Notes that for months to years prior to this  exacerbation she had been doing well. Denies having limitations in activities.  Quit smoking 10 years ago, 60-80 pack year history.   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theatre manager, used to clean apartments, now retired, does not currently smoke, 60-80 pack year history   PFT:    Latest Ref Rng & Units 07/16/2013   11:08 AM  PFT Results  FVC-Pre L 2.80   FVC-Predicted Pre % 80   FVC-Post L 2.78   FVC-Predicted Post % 79   Pre FEV1/FVC % % 64   Post FEV1/FCV % % 64   FEV1-Pre L 1.81   FEV1-Predicted Pre % 66   FEV1-Post L 1.79   DLCO uncorrected ml/min/mmHg 12.63   DLCO UNC% % 49   DLVA Predicted % 53   TLC L 5.04   TLC % Predicted % 96   RV % Predicted % 102   Personally viewed interpreted as moderate fixed obstruction   Imaging: Personally reviewed and as per discussion in this note   Lab Results: Personally reviewed, abs EOS 300 04/2019 CBC    Component Value Date/Time   WBC 6.0 01/19/2024 1259   WBC 5.2 10/29/2023 0316   RBC 3.46 (L) 01/19/2024 1259   HGB 11.1 (L) 01/19/2024 1259   HCT 34.3 (L) 01/19/2024 1259   PLT 265 01/19/2024 1259   MCV 99.1 01/19/2024 1259   MCH 32.1 01/19/2024 1259   MCHC 32.4 01/19/2024 1259   RDW 13.2 01/19/2024 1259   LYMPHSABS 0.6 (L) 01/19/2024 1259  MONOABS 0.8 01/19/2024 1259   EOSABS 0.1 01/19/2024 1259   BASOSABS 0.0 01/19/2024 1259    BMET    Component Value Date/Time   NA 135 01/19/2024 1259   K 4.0 01/19/2024 1259   CL 106 01/19/2024 1259   CO2 24 01/19/2024 1259   GLUCOSE 89 01/19/2024 1259   BUN 18 01/19/2024 1259   CREATININE 1.08 (H) 01/19/2024 1259   CALCIUM  9.5 01/19/2024 1259   GFRNONAA 56 (L) 01/19/2024 1259   GFRAA >60 10/10/2019 0645    BNP No results found for: BNP  ProBNP No results found for: PROBNP  Specialty Problems       Pulmonary Problems   COPD GOLD II    -  Spirometry 2015  FEV1 58%, ratio 64% - Exacerbation requiring hospitalization 09/2019      COPD exacerbation (HCC)    Allergies  Allergen Reactions   Dilantin [Phenytoin] Other (See Comments)    Hallucination   Codeine Nausea Only and Other (See Comments)    skin crawling Can take hydrocodone      Immunization History  Administered Date(s) Administered   Fluad Quad(high Dose 65+) 01/17/2022   Influenza Split 01/06/2020   Influenza Whole 01/05/2020   Influenza, Seasonal, Injecte, Preservative Fre 03/31/2017, 12/31/2017   Influenza,inj,Quad PF,6-35 Mos 12/06/2018, 12/31/2019, 12/27/2020   Influenza-Unspecified 12/23/2017   Moderna SARS-COV2 Booster Vaccination 11/24/2019   Moderna Sars-Covid-2 Vaccination 04/26/2019, 05/24/2019, 11/24/2019   PFIZER(Purple Top)SARS-COV-2 Vaccination 12/27/2020   Pneumococcal Polysaccharide-23 08/17/2014   Respiratory Syncytial Virus Vaccine,Recomb Aduvanted(Arexvy) 03/26/2022   Tdap 03/27/2007, 09/09/2019   Zoster Recombinant(Shingrix) 01/06/2020, 05/29/2020    Past Medical History:  Diagnosis Date   Anxiety    Bipolar disorder (HCC)    Dr. Vincente   COPD (chronic obstructive pulmonary disease) (HCC) 2006   Depression    Hypothyroidism    Rheumatic fever    as a child, age 33yo   Urinary tract infection    hx/o   Wears glasses     Tobacco History: Social History   Tobacco Use  Smoking Status Former   Current packs/day: 0.00   Average packs/day: 0.3 packs/day for 40.0 years (10.0 ttl pk-yrs)   Types: Cigarettes   Start date: 10/19/1975   Quit date: 10/19/2015   Years since quitting: 8.3  Smokeless Tobacco Never   Counseling given: Not Answered   Continue to not smoke  Outpatient Encounter Medications as of 02/04/2024  Medication Sig   acetaminophen  (TYLENOL ) 325 MG tablet Take 2 tablets (650 mg total) by mouth every 6 (six) hours as needed for mild pain (pain score 1-3) or fever (or Fever >/= 101).   albuterol  (VENTOLIN  HFA)  108 (90 Base) MCG/ACT inhaler Inhale 2 puffs into the lungs every 6 (six) hours as needed for wheezing or shortness of breath.   apixaban  (ELIQUIS ) 5 MG TABS tablet Take 1 tablet (5 mg total) by mouth 2 (two) times daily.   atorvastatin  (LIPITOR) 20 MG tablet Take 20 mg by mouth daily.    baclofen  (LIORESAL ) 10 MG tablet Take 1 tablet (10 mg total) by mouth 2 (two) times daily as needed for muscle spasms.   buPROPion  (WELLBUTRIN  XL) 150 MG 24 hr tablet Take 450 mg by mouth every morning.   butalbital -acetaminophen -caffeine  (FIORICET) 50-325-40 MG tablet Take 1 tablet by mouth every 4 (four) hours as needed for headache.   diazepam  (VALIUM ) 5 MG tablet Take 1 tablet (5 mg total) by mouth 2 (two) times daily as needed for anxiety.   Fluticasone -Umeclidin-Vilant (  TRELEGY ELLIPTA ) 100-62.5-25 MCG/ACT AEPB USE 1 INHALATION DAILY (NEED APPOINTMENT FOR FURTHER REFILLS)   gabapentin  (NEURONTIN ) 600 MG tablet Take 600 mg by mouth 2 (two) times daily. Take 0.5 tablet (300 mg) in the morning and Take 1.5 (900 mg) in the evening   guaiFENesin  (MUCINEX ) 600 MG 12 hr tablet Take 600 mg by mouth 2 (two) times daily as needed for cough.   ipratropium-albuterol  (DUONEB) 0.5-2.5 (3) MG/3ML SOLN Take 3 mLs by nebulization every 4 (four) hours as needed. (Patient taking differently: Take 3 mLs by nebulization every 4 (four) hours as needed (wheezing / shortness of breath).)   lamoTRIgine  (LAMICTAL ) 200 MG tablet Take 1 tablet (200 mg total) by mouth at bedtime.   levothyroxine  (SYNTHROID , LEVOTHROID) 75 MCG tablet Take 0.75 mcg by mouth daily.   magic mouthwash (nystatin , lidocaine , diphenhydrAMINE, alum & mag hydroxide) suspension Swish and swallow 5 mLs by mouth 3 (three) times daily as needed for mouth pain.   Multiple Vitamins-Minerals (CENTRUM ADULT PO) Take 1 tablet by mouth daily at 12 noon.   polyethylene glycol (MIRALAX ) 17 g packet Take 17 g by mouth daily as needed for mild constipation, moderate constipation  or severe constipation.   valACYclovir  (VALTREX ) 500 MG tablet Take 500 mg by mouth daily.   No facility-administered encounter medications on file as of 02/04/2024.     Review of Systems  Review of Systems  N/a  Physical Exam  BP (!) 104/58   Pulse 93   Ht 5' 3 (1.6 m) Comment: per pt  Wt 148 lb 14.4 oz (67.5 kg)   SpO2 94%   BMI 26.38 kg/m   Wt Readings from Last 5 Encounters:  02/04/24 148 lb 14.4 oz (67.5 kg)  01/26/24 152 lb 4.8 oz (69.1 kg)  12/23/23 148 lb 14.4 oz (67.5 kg)  11/19/23 144 lb 11.2 oz (65.6 kg)  10/26/23 159 lb 13.3 oz (72.5 kg)    BMI Readings from Last 5 Encounters:  02/04/24 26.38 kg/m  01/26/24 26.98 kg/m  12/23/23 26.38 kg/m  11/19/23 25.63 kg/m  10/26/23 28.31 kg/m     Physical Exam General: Sitting in chair in no acute distress Eyes: No icterus Respiratory: Clear bilaterally, normal work of breathing, on room air Cardiovascular: Warm, no edema Abdomen: Nondistended  Assessment & Plan:   COPD: Relatively rare exacerbations often seem triggered by seasonal allergies.  None in the interim.  Continue Trelegy.  Continue albuterol .  Seasonal allergies: Worse in the fall and seem to trigger exacerbations.  Currently less of an issue although she states she has not been out of the house much.  Montelukast  discontinued given concerns for could contribute to loose nations in the future.  Had bad reaction Dilaudid  with hallucinations.  Okay to remain off of this..  Zyrtec  stopped 12/22 as some concern for weight gain.  Pulmonary embolism: Provoked in setting of malignancy.  Continue indefinite anticoagulation especially as long as experiencing active cancer as this is likely the most provoking factor.  Difficulty with blood counts so certainly will need to weigh risk and benefits as it relates to anemia in the future.  Further management by her hematologist/oncologist.  Return in about 1 year (around 02/03/2025) for f/u Dr.  Annella.   Donnice JONELLE Annella, MD 02/04/2024

## 2024-02-04 NOTE — Patient Instructions (Signed)
 Nice to see you again  No changes to medication, will continue Trelegy  Okay to stay off montelukast  or Singulair   Return to clinic in 1 year or sooner if needed with Dr. Annella

## 2024-02-13 ENCOUNTER — Other Ambulatory Visit: Payer: Self-pay

## 2024-02-26 ENCOUNTER — Other Ambulatory Visit: Payer: Self-pay | Admitting: Pulmonary Disease

## 2024-02-27 ENCOUNTER — Other Ambulatory Visit: Payer: Self-pay

## 2024-02-27 ENCOUNTER — Telehealth: Payer: Self-pay

## 2024-02-27 MED ORDER — TRELEGY ELLIPTA 100-62.5-25 MCG/ACT IN AEPB: 1.0000 | INHALATION_SPRAY | Freq: Every day | RESPIRATORY_TRACT | 11 refills | Status: AC

## 2024-02-27 NOTE — Telephone Encounter (Signed)
 Copied from CRM [id number]. Topic: Clinical - Prescription Issue >> Feb 27, 2024 10:21 AM Misty [name] wrote: Reason for CRM: Patient is calling because her Trelegy Inhaler shows a denial for 'appointment needed'. She is a bit confused since she has seen Dr. Annella within the last month. Please call to advise on refill request.    Spoke with patient [facility]

## 2024-03-01 ENCOUNTER — Telehealth: Payer: Self-pay | Admitting: Pulmonary Disease

## 2024-03-01 ENCOUNTER — Encounter: Payer: Self-pay | Admitting: Hematology

## 2024-03-01 ENCOUNTER — Other Ambulatory Visit: Payer: Self-pay

## 2024-03-01 DIAGNOSIS — C210 Malignant neoplasm of anus, unspecified: Secondary | ICD-10-CM

## 2024-03-01 NOTE — Telephone Encounter (Unsigned)
 Copied from CRM [id number]. Topic: Clinical - Medication Refill >> Mar 01, 2024 11:06 AM Leila BROCKS wrote: Most Recent Pulmonary Care Visit:  Provider: Dr. Donnice Beals Department: [facility] Date: 02/04/24  Medication(s): albuterol  (VENTOLIN  HFA) 108 (90 Base) MCG/ACT inhaler   Has the patient contacted their pharmacy? No. Patient states needs a refill albuterol  (VENTOLIN  HFA) 108 (90 Base) MCG/ACT inhaler, does not have the box and does not remember which pharmacy she went to. Patient is out of medication.   (Agent: If no, request that the patient contact the pharmacy for the refill. If patient does not wish to contact the pharmacy document the reason why and proceed with request.) (Agent: If yes, when and what did the pharmacy advise?)  This is the patient's preferred pharmacy:  [id number] [place], [place] - [address] AT [facility] [address] [facility] [address] [facility] [address] [place] [address] Phone: [phone] Fax: [phone]  Is this the correct pharmacy for this prescription? Yes If no, delete pharmacy and type the correct one.   Has the prescription been filled recently? No  Is the patient out of the medication? Yes  Has the patient been seen for an appointment in the last year OR does the patient have an upcoming appointment? Yes  Can we respond through MyChart? Yes  Agent: Please be advised that Rx refills may take up to 3 business days. We ask that you follow-up with your pharmacy.

## 2024-03-01 NOTE — Progress Notes (Signed)
 Verbal order w/readback from Powell Lessen, NP for Pelvic Floor PT referral.  Order placed and pt notified.

## 2024-03-02 MED ORDER — ALBUTEROL SULFATE HFA 108 (90 BASE) MCG/ACT IN AERS: 2.0000 | INHALATION_SPRAY | Freq: Four times a day (QID) | RESPIRATORY_TRACT | 3 refills | Status: AC | PRN

## 2024-04-05 NOTE — Progress Notes (Signed)
" ° ° °  REFERRING PHYSICIAN:  Self  PROVIDER:  BERNARDA WANDA NED, MD  MRN: [id number] DOB: 03-05-1956 DATE OF ENCOUNTER: 04/05/2024  Subjective     History of Present Illness: Misty [name] is a 69 y.o. 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  Patient was noted to have an anal mass during sigmoidoscopy on Aug 12, 2023.  This showed a 3 cm anterior anal mass abutting the rectovaginal septum and posterior vaginal wall extending into the ischiorectal space bilaterally and out to the anoderm as well.  Biopsy showed infiltrating squamous cell carcinoma, moderately differentiated. She completed chemo RT and is here for follow up.  Her radiation was cut by 2 doses due to neutropenic fever.  Last does was 10/24/23.  She also had a PE.   PET completed in October 2025 shows no hypermetabolic adenopathy or signs of metastatic spread.  Mild low-level activity noted in the anal canal. Objective:    There were no vitals filed for this visit.    Exam Gen: NAD Ing: no LAD present Rectal: Anterior anal scar abutting the rectovaginal septum    Labs, Imaging and Diagnostic Testing:  Procedure: Anoscopy Surgeon: Ned After the risks and benefits were explained, written consent was obtained for above procedure.  A medical assistant chaperone was present thoroughout the entire procedure. Examination chaperoned by Misty Crete, LPN. Anesthesia: none Diagnosis: anal scc Findings: L anterior scar with central ulceration, reduced in size  Assessment and Plan:  Diagnoses and all orders for this visit:  Anal squamous cell carcinoma (CMS/HHS-HCC)   69 year old female who is here for post treatment surveillance after ChemoRT for Anal SCC (completed Aug 2025).  She has a anterior midline to left anterior mass that is decreasing in size.  We will continue to follow this over the next few months.    Bernarda JAYSON Ned, MD Colon and Rectal Surgery [facility]         "

## 2024-04-15 ENCOUNTER — Ambulatory Visit: Payer: Medicare (Managed Care) | Attending: Nurse Practitioner

## 2024-04-15 ENCOUNTER — Other Ambulatory Visit: Payer: Self-pay | Admitting: Hematology

## 2024-04-15 ENCOUNTER — Other Ambulatory Visit: Payer: Self-pay

## 2024-04-15 DIAGNOSIS — R102 Pelvic and perineal pain unspecified side: Secondary | ICD-10-CM | POA: Diagnosis not present

## 2024-04-15 DIAGNOSIS — N3941 Urge incontinence: Secondary | ICD-10-CM | POA: Diagnosis not present

## 2024-04-15 DIAGNOSIS — M6281 Muscle weakness (generalized): Secondary | ICD-10-CM | POA: Diagnosis not present

## 2024-04-15 DIAGNOSIS — M62838 Other muscle spasm: Secondary | ICD-10-CM | POA: Insufficient documentation

## 2024-04-15 DIAGNOSIS — Z483 Aftercare following surgery for neoplasm: Secondary | ICD-10-CM | POA: Insufficient documentation

## 2024-04-15 DIAGNOSIS — C210 Malignant neoplasm of anus, unspecified: Secondary | ICD-10-CM | POA: Insufficient documentation

## 2024-04-15 DIAGNOSIS — R159 Full incontinence of feces: Secondary | ICD-10-CM | POA: Diagnosis not present

## 2024-04-15 DIAGNOSIS — R279 Unspecified lack of coordination: Secondary | ICD-10-CM | POA: Insufficient documentation

## 2024-04-15 DIAGNOSIS — R293 Abnormal posture: Secondary | ICD-10-CM | POA: Insufficient documentation

## 2024-04-15 MED ORDER — ESTRADIOL 0.01 % VA CREA: 1.0000 | TOPICAL_CREAM | VAGINAL | 2 refills | Status: AC

## 2024-04-15 NOTE — Patient Instructions (Signed)
 Squatty potty: When your knees are level or below the level of your hips, pelvic floor muscles are pressed against rectum, preventing ease of bowel movement. By getting knees above the level of the hips, these pelvic floor muscles relax, allowing easier passage of bowel movement. ? Ways to get knees above hips: o Squatty Potty (7inch and 9inch versions) o Small stool o Roll of toilet paper under each foot o Hardback book or stack of magazines under each foot  Relaxed Toileting mechanics: Once in this position, make sure to lean forward with forearms on thighs, wide knees, relaxed stomach, and breathe.    Using water wipes to clean perineum; avoiding any contact with soap to perineum. Try putting water wipes in the fridge for when she is very irritated.   Moisturizing on regular basis with coconut oil in addition to starting coconut oil.  Bowel massage: To assist with more regular and more comfortable bowel movements, try performing bowel massage nightly for 5-10 minutes. Place hands in the lower right side of your abdomen to start; in small circles, massage up, across, and down the left side of your abdomen. Pressure does not need to be hard, but just comfortable. You can use lotion or oil to make more comfortable.    [facility] [address] [place], [place] [address] Phone # [phone] Fax [phone]

## 2024-04-15 NOTE — Progress Notes (Signed)
 " OUTPATIENT PHYSICAL THERAPY FEMALE PELVIC EVALUATION   Patient Name: Misty [name] MRN: [id number] DOB:January 05, 1956, 69 y.o., female Today's Date: 04/15/2024  END OF SESSION:  PT End of Session - 04/15/24 1448     Visit Number 1    Date for Recertification  07/08/24    Authorization Type [employer]    PT Start Time 1448    PT Stop Time 1534    PT Time Calculation (min) 46 min    Activity Tolerance Patient tolerated treatment well    Behavior During Therapy WFL for tasks assessed/performed          Past Medical History:  Diagnosis Date   Anxiety    Bipolar disorder (HCC)    Dr. 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   Hypothyroidism 10/09/2019   HLD (hyperlipidemia) 10/09/2019   Bipolar 1 disorder (HCC) 10/09/2019   COPD exacerbation (HCC) 10/08/2019   COPD GOLD II 07/17/2013   Smoker 07/17/2013    PCP: Dorcus [name], MD  REFERRING PROVIDER: [name], MD   REFERRING DIAG: C21.0 (ICD-10-CM) - Squamous cell carcinoma of anus (HCC)  THERAPY DIAG:  Pelvic pain  Muscle weakness (generalized)  Unspecified lack of coordination  Other muscle spasm  Abnormal posture  Urge incontinence of urine  Full incontinence of feces  Rationale for Evaluation and Treatment: Rehabilitation  ONSET DATE: 08/12/2023  SUBJECTIVE:                                                                                                                                                                                            SUBJECTIVE STATEMENT: Pt found to have anal mass during sigmoidoscopy on Aug 12, 2023 (3cm anterior anal mass). She had chemo/radiation therapy that ended 10/24/2023. PET scan in October 1025 showed no signs of metastatic spread. Since chemoradiation she has been having constipation and diarrhea. She was evaluated by surgeon last Monday  and there was no evidence of a tumor. She states that bowel movements are very painful. She has vaginal pain as well. MD feels like pain is coming from ulceration in scar tissue - she returns in 2 months to see how this is healing - states that patient is at a high risk for fistula right now. She does not feel stable through her pelvis.   PET from 01/19/24: calcifications in the Lt hamstring tendon    PAIN:  Are you having pain? Yes NPRS scale: 2/10 currently, 3-4 at worst  Pain location: vaginal and perineum; bil groin pain  Pain type: burning, sharp, and tight Pain description: intermittent and constant   Aggravating factors: sitting, bowel movement, getting in and out of car, driving, standing  Relieving factors: gentle cleansing, not sitting  PRECAUTIONS: None and Other: anal squamous cell carcinoma (SCC) - completed chemo/radiation last dose 10/24/2023; she also had a PE during treamtent; high risk for rectal-vaginal fistula currently and has active rectal ulcer in anterior wall scar tissue (no rectal exams right now)  RED FLAGS: None   WEIGHT BEARING RESTRICTIONS: No  FALLS:  Has patient fallen in last 6 months? No  OCCUPATION: not working  ACTIVITY LEVEL : none  PLOF: Independent  PATIENT GOALS: to decrease pain, improve bowel movements, improve overall strength, decrease urinary and fecal incontinence  PERTINENT HISTORY:  Abdominal hysterectomy, appendectomy, c-section, uterine fibroid  surgery, anxiety, COPD, depression, hypothyroidism,  Sexual abuse: No  BOWEL MOVEMENT: Pain with bowel movement: Yes Type of bowel movement:Type (Bristol Stool Scale) 4, Frequency every 3 days , and Strain yes Fully empty rectum: No Leakage: Yes:   Urgency: No Pads: Yes: 1 if just at home, more if she goes out Fiber supplement/laxative 3 stool softeners a day   URINATION: Pain with urination: No Fully empty bladder: Yes: but has to push on her abdomen to empty Stream: Strong Urgency: Yes  Frequency: every 2 hours  Nocturia: 1x Fluid Intake: 6-8 glasses of water a day - large glasses Leakage: Urge to void and Walking to the bathroom Pads: Yes: 1 if just at home, more if she goes out   INTERCOURSE:  NA  PREGNANCY: Vaginal deliveries 1 Tearing No, but tailbone broken  Episiotomy No C-section deliveries 1 Currently pregnant No  PROLAPSE: None   OBJECTIVE:  Note: Objective measures were completed at Evaluation unless otherwise noted.  04/15/2024 PATIENT SURVEYS:   PFIQ-7 92 (UIQ-7 48, CRAIQ-7 48)  COGNITION: Overall cognitive status: Within functional limits for tasks assessed     SENSATION: Light touch: Appears intact   FUNCTIONAL TESTS:  Squat: limited, bil valgus knee collapse  Single leg stance:  Rt: pelvic drop   Lt: pelvic drop  Curl-up test: 2 finger width diastasis, midline bulge throughout  Sit-up test: 0/3 Sit-stand: pain in bil groin - difficulty with fully coming to standing position  5x sit to stand: 24 seconds    GAIT: Assistive device utilized: Single point cane Comments: short step length, unstable, narrow base of support  POSTURE: rounded shoulders, forward head, decreased lumbar lordosis, posterior pelvic tilt, and elevated Lt iliac crest    LUMBARAROM/PROM:  A/PROM A/PROM  Eval (% available)  Flexion 75  Extension 75  Right lateral flexion 75  Left lateral flexion 75  Right rotation 75  Left rotation 75   (Blank rows = not  tested)  PALPATION: General: increased tone in bil lumbar parapsinals  Abdominal: Sternocostal angle: significant increase  Breathing: apical Tenderness: throughout abdomen, more  notable bil lower quadrant  Scar tissue: restriction lower abdomen and low back Diastasis: 2 finger width separation                 External Perineal Exam: deferred                             Internal Pelvic Floor: deferred  Patient confirms identification and approves PT to assess internal pelvic floor and treatment Yes, but waiting until next session. We will not perform internmal rectal assessment until she is more healed from radiation and is at a lower risk for fistula.   PELVIC MMT: deferred   MMT eval  Vaginal   Internal Anal Sphincter   External Anal Sphincter   Puborectalis   (Blank rows = not tested)        TONE: deferred  PROLAPSE: deferred  TODAY'S TREATMENT:                                                                                                                              DATE:  04/15/2024 EVAL  Therapeutic activities: Squatty potty Relaxed toilet mechanics Encouraged to talk with MD about benefit of vaginal estrogen to vagina, perineum, and anus Moisturizing with coconut oil  No soap to perineum Education on self bowel massage Avoid using anything but water wipes to clean    PATIENT EDUCATION:  Education details: See above Person educated: Patient Education method: Programmer, Multimedia, Facilities Manager, Actor cues, Verbal cues, and Handouts Education comprehension: verbalized understanding  HOME EXERCISE PROGRAM: Written handout  ASSESSMENT:  CLINICAL IMPRESSION: Patient is a 69 y.o. female who was seen today for physical therapy evaluation and treatment for perineal pain, weakness, and urinary/fecal incontinence following chemoradiation for squamous cell carcinoma. Exam findings notable for decreased lumbar A/ROM, abnormal posture, limitations and weakness with  squatting, pelvic drop in single leg stance, core weakness, abdominal scar tissue restriction, increased sternocostal angle; no pelvic exam performed today, but we discussed and will plan to perform vaginal pelvic floor muscle exam next session. We will avoid rectal pelvic floor muscle assessment at this time due to ulcer and high likelihood of rectal-vaginal fistula developing. We did message referring provider during session about estrogen cream and provider agreed to call it in. Signs and symptoms are most consistent with post-chemoradiation treatment. Initial treatment included lifestyle management for improved comfort and techniques to help better care for perineal tissue. She will continue to benefit from skilled PT intervention in order to decrease pain, decrease urinary/fecal incontinence, improve strength, and address impairments.    OBJECTIVE IMPAIRMENTS: decreased activity tolerance, decreased coordination, decreased endurance, decreased mobility, decreased ROM, decreased strength, increased fascial restrictions, increased muscle spasms, impaired flexibility, impaired tone, improper body mechanics, postural dysfunction, and pain.   ACTIVITY LIMITATIONS: carrying, lifting, bending, sitting, standing, squatting, and continence  PARTICIPATION LIMITATIONS: cleaning, laundry, driving, community activity, and exercise  PERSONAL FACTORS: 3+ comorbidities: medical history are  also affecting patient's functional outcome.   REHAB POTENTIAL: Good  CLINICAL DECISION MAKING: Evolving/moderate complexity  EVALUATION COMPLEXITY: Moderate   GOALS: Goals reviewed with patient? Yes  SHORT TERM GOALS: Target date: 05/13/2024   Pt will be independent with HEP in order to improve activity tolerance.   Baseline: Goal status: INITIAL  2.  Pt will be independent with use of squatty potty, relaxed toileting mechanics, and improved bowel movement techniques in order to increase ease of bowel movements  and complete evacuation.    Baseline:  Goal status: INITIAL  3.  Pt will be independent with self-abdominal mobilization.   Baseline:  Goal status: INITIAL  4.  Pt will be able to teach back and utilize urge suppression technique in order to help reduce number of trips to the bathroom.    Baseline:  Goal status: INITIAL  5.  Pt will be independent with daily vaginal moisturizing to help improve pain and tissue quality.  Baseline:  Goal status: INITIAL  6.  Patient will report 25% improvement in perineal pain in order to increase activity tolerance.   Baseline: 4/10 Goal status: INITIAL  LONG TERM GOALS: Target date: 07/08/2024  Pt will be independent with advanced HEP in order to improve activity tolerance.   Baseline:  Goal status: INITIAL  2.  Pt will report at least 5 bowel movements a week with complete emptying, Bristol Stool Scale 4-5, and no pain. Baseline:  Goal status: INITIAL  3.  Pt will demonstrate normal pelvic floor muscle tone and A/ROM, able to achieve 3/5 strength with contractions and 10 sec endurance, in order to reduce urinary leaking and number of pads patient wears.   Baseline:  Goal status: INITIAL  4.  Pt will be able to sit for >30 minutes without increased perineal pain in order to go to meetings without difficulty.  Baseline:  Goal status: INITIAL  5.  Pt will be able to stand for >30 minutes without increased perineal pain in order to go to meetings without difficulty.  Baseline:  Goal status: INITIAL  6.  Pt will be able to get into and out of car without pain or difficulty.  Baseline:  Goal status: INITIAL  7. Patient will report 75% improvement in perineal pain in order to increase activity tolerance.   Baseline:  Goal status: INITIAL  8. Pt will be able to go 2-3 hours in between voids without urgency or incontinence in order to improve QOL and perform all functional activities with less difficulty.   Baseline:  Goal status:  INITIAL   PLAN:  PT FREQUENCY: 1-2x/week  PT DURATION: 16 visits    PLANNED INTERVENTIONS: 97164- PT Re-evaluation, 97110-Therapeutic exercises, 97530- Therapeutic activity, 97112- Neuromuscular re-education, [phone]- Self Care, 02859- Manual therapy, [phone]- Gait training, [phone]- Aquatic Therapy, [phone]- Electrical stimulation (unattended), [phone]- Traction (mechanical), [id number]- Ionotophoresis 4mg /ml Dexamethasone , 79439 (1-2 muscles), 20561 (3+ muscles)- Dry Needling, Patient/Family education, Balance training, Taping, Joint mobilization, Joint manipulation, Spinal manipulation, Spinal mobilization, Scar mobilization, Vestibular training, Cryotherapy, Moist heat, and Biofeedback  PLAN FOR NEXT SESSION: vaginal pelvic floor muscle assessment to patient tolerance to assess for pelvic floor muscle strength, ROM, scar tissue restriction, tissue quality, and coordination; continue to discuss healthy bowel and bladder habits, urge drill and bladder retraining; manual techniques to abdomen; mobility activities, strengthening  Josette Mares, PT, DPT01/22/269:35 PM [facility] [address], [address] [place], [place] [address] Phone # [phone] Fax [phone]   "

## 2024-04-20 ENCOUNTER — Ambulatory Visit: Payer: Medicare (Managed Care)

## 2024-04-21 ENCOUNTER — Ambulatory Visit: Payer: Medicare (Managed Care)

## 2024-04-21 DIAGNOSIS — M62838 Other muscle spasm: Secondary | ICD-10-CM

## 2024-04-21 DIAGNOSIS — R102 Pelvic and perineal pain unspecified side: Secondary | ICD-10-CM

## 2024-04-21 DIAGNOSIS — M6281 Muscle weakness (generalized): Secondary | ICD-10-CM

## 2024-04-21 DIAGNOSIS — Z483 Aftercare following surgery for neoplasm: Secondary | ICD-10-CM | POA: Diagnosis not present

## 2024-04-21 DIAGNOSIS — R159 Full incontinence of feces: Secondary | ICD-10-CM

## 2024-04-21 DIAGNOSIS — N3941 Urge incontinence: Secondary | ICD-10-CM

## 2024-04-21 DIAGNOSIS — R293 Abnormal posture: Secondary | ICD-10-CM

## 2024-04-21 DIAGNOSIS — R279 Unspecified lack of coordination: Secondary | ICD-10-CM

## 2024-04-21 NOTE — Therapy (Signed)
 " OUTPATIENT PHYSICAL THERAPY FEMALE PELVIC TREATMENT   Patient Name: Misty [name] MRN: [id number] DOB:[birth year], 69 y.o., female Today's Date: 04/21/2024  END OF SESSION:  PT End of Session - 04/21/24 1538     Visit Number 2    Date for Recertification  07/08/24    Authorization Type [employer]    PT Start Time 1530    PT Stop Time 1610    PT Time Calculation (min) 40 min    Activity Tolerance Patient tolerated treatment well    Behavior During Therapy [facility] for tasks assessed/performed          Past Medical History:  Diagnosis Date   Anxiety    Bipolar disorder (HCC)    Dr. 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   Hypothyroidism 10/09/2019   HLD (hyperlipidemia) 10/09/2019   Bipolar 1 disorder (HCC) 10/09/2019   COPD exacerbation (HCC) 10/08/2019   COPD GOLD II 07/17/2013   Smoker 07/17/2013    PCP: Dorcus [name], MD  REFERRING PROVIDER: [name], MD   REFERRING DIAG: C21.0 (ICD-10-CM) - Squamous cell carcinoma of anus (HCC)  THERAPY DIAG:  Pelvic pain  Muscle weakness (generalized)  Unspecified lack of coordination  Other muscle spasm  Abnormal posture  Urge incontinence of urine  Full incontinence of feces  Rationale for Evaluation and Treatment: Rehabilitation  ONSET DATE: 08/12/2023   SUBJECTIVE:                                                                                                                                                                                             SUBJECTIVE STATEMENT: Pt has started using estrogen cream, but just externally due to significant pain with trying to even get finger inside vaginal opening. She is using coconut oil and feels like this is helpful as well. She feels like her bowel movements are getting more normal as she adds  fiber into her diet.    EVAL: Pt found to have anal mass during sigmoidoscopy on Aug 12, 2023 (3cm anterior anal mass). She had chemo/radiation therapy that ended 10/24/2023. PET scan in October 1025 showed no signs of metastatic spread. Since chemoradiation she has been having constipation and diarrhea. She was evaluated by surgeon last Monday and there was no evidence of a tumor. She states that bowel movements are very painful. She has vaginal pain as well. MD feels like pain is coming from ulceration in scar tissue - she returns in 2 months to see how this is healing - states that patient is at a high risk for fistula right now. She does not feel stable through her pelvis.    PET from 01/19/24: calcifications in the Lt hamstring tendon      PAIN:  Are you having pain? Yes NPRS scale: 2/10 currently, 3-4 at worst  Pain location: vaginal and perineum; bil groin pain   Pain type: burning, sharp, and tight Pain description: intermittent and constant    Aggravating factors: sitting, bowel movement, getting in and out of car, driving, standing  Relieving factors: gentle cleansing, not sitting   PRECAUTIONS: None and Other: anal squamous cell carcinoma (SCC) - completed chemo/radiation last dose 10/24/2023; she also had a PE during treamtent; high risk for rectal-vaginal fistula currently and has active rectal ulcer in anterior wall scar tissue (no rectal exams right now)   RED FLAGS: None       WEIGHT BEARING RESTRICTIONS: No   FALLS:  Has patient  fallen in last 6 months? No   OCCUPATION: not working   ACTIVITY LEVEL : none   PLOF: Independent   PATIENT GOALS: to decrease pain, improve bowel movements, improve overall strength, decrease urinary and fecal incontinence   PERTINENT HISTORY:  Abdominal hysterectomy, appendectomy, c-section, uterine fibroid surgery, anxiety, COPD, depression, hypothyroidism,  Sexual abuse: No   BOWEL MOVEMENT: Pain with bowel movement: Yes Type of bowel movement:Type (Bristol Stool Scale) 4, Frequency every 3 days , and Strain yes Fully empty rectum: No Leakage: Yes:   Urgency: No Pads: Yes: 1 if just at home, more if she goes out Fiber supplement/laxative 3 stool softeners a day    URINATION: Pain with urination: No Fully empty bladder: Yes: but has to push on her abdomen to empty Stream: Strong Urgency: Yes  Frequency: every 2 hours  Nocturia: 1x Fluid Intake: 6-8 glasses of water a day - large glasses Leakage: Urge to void and Walking to the bathroom Pads: Yes: 1 if just at home, more if she goes out    INTERCOURSE:             NA   PREGNANCY: Vaginal deliveries 1 Tearing No, but tailbone broken  Episiotomy No C-section deliveries 1 Currently pregnant No   PROLAPSE: None     OBJECTIVE:  Note: Objective measures were completed at Evaluation unless otherwise noted.  04/21/24 External Perineal Exam: significant skin irritation around vulva, perineal body, external anal sphincter and surrounding areas with evidence of radiation burns  -open external anal sphincter, but active contraction and closing noted  -noted vaginal pelvic floor muscle contraction, but appears very weak and difficult to palpate externally - did bear down with some but able to correct -significant pain vaginally and unable to even reach introitus with palpation due to pain  04/15/2024 PATIENT SURVEYS:    PFIQ-7 92 (UIQ-7 48, CRAIQ-7 48)   COGNITION: Overall cognitive status: Within functional limits  for  tasks assessed                          SENSATION: Light touch: Appears intact     FUNCTIONAL TESTS:  Squat: limited, bil valgus knee collapse  Single leg stance:             Rt: pelvic drop              Lt: pelvic drop  Curl-up test: 2 finger width diastasis, midline bulge throughout  Sit-up test: 0/3 Sit-stand: pain in bil groin - difficulty with fully coming to standing position  5x sit to stand: 24 seconds      GAIT: Assistive device utilized: Single point cane Comments: short step length, unstable, narrow base of support   POSTURE: rounded shoulders, forward head, decreased lumbar lordosis, posterior pelvic tilt, and elevated Lt iliac crest      LUMBARAROM/PROM:   A/PROM A/PROM  Eval (% available)  Flexion 75  Extension 75  Right lateral flexion 75  Left lateral flexion 75  Right rotation 75  Left rotation 75   (Blank rows = not tested)   PALPATION: General: increased tone in bil lumbar parapsinals   Abdominal: Sternocostal angle: significant increase  Breathing: apical Tenderness: throughout abdomen, more notable bil lower quadrant  Scar tissue: restriction lower abdomen and low back Diastasis: 2 finger width separation                  External Perineal Exam: deferred                             Internal Pelvic Floor: deferred   Patient confirms identification and approves PT to assess internal pelvic floor and treatment Yes, but waiting until next session. We will not perform internmal rectal assessment until she is more healed from radiation and is at a lower risk for fistula.       TODAY'S TREATMENT:                                                                                                                              DATE:  04/21/2024 Manual: Pt provides verbal consent for internal vaginal/rectal pelvic floor exam. External/internal exam only to assess tissue quality, strength, and coordination Pelvic floor muscle contraction training for  quick flicks and breath coordination Therapeutic activities: Urge drill Double voiding Using squatty potty for urination   04/15/2024 EVAL  Therapeutic activities: Squatty potty Relaxed toilet mechanics Encouraged to talk with MD about benefit of vaginal estrogen to vagina, perineum, and anus Moisturizing with coconut oil  No soap to perineum Education on self bowel massage Avoid using anything but water wipes to clean       PATIENT EDUCATION:  Education details: See above Person educated: Patient Education method: Explanation, Demonstration, Actor cues, Verbal cues, and Handouts Education comprehension: verbalized understanding   HOME EXERCISE PROGRAM:  Written handout   ASSESSMENT:   CLINICAL IMPRESSION: Patient is a 69 y.o. female who was seen today for physical therapy evaluation and treatment for perineal pain, weakness, and urinary/fecal incontinence following chemoradiation for squamous cell carcinoma. Pt has seen some good progress in comfort and bowel movements after first session. We did external pelvic exam today with notable findings of significant scar tissue around perineum with continued skin irritation. We were unable to assess vaginally or even near introitus due to high level of discomfort. She was encouraged to use her vaginal moisturizers and estrogen cream with finger in order to get as close as possible to introitus, encouraging her that she will not do any damage with use of finger and gently putting cream on. We went over pelvic floor muscle contraction and urge drill to help improve urgency and increase pelvic floor muscle A/ROM.  Good tolerance to all treatment today. She will continue to benefit from skilled PT intervention in order to decrease pain, decrease urinary/fecal incontinence, improve strength, and address impairments.     OBJECTIVE IMPAIRMENTS: decreased activity tolerance, decreased coordination, decreased endurance, decreased mobility,  decreased ROM, decreased strength, increased fascial restrictions, increased muscle spasms, impaired flexibility, impaired tone, improper body mechanics, postural dysfunction, and pain.    ACTIVITY LIMITATIONS: carrying, lifting, bending, sitting, standing, squatting, and continence   PARTICIPATION LIMITATIONS: cleaning, laundry, driving, community activity, and exercise   PERSONAL FACTORS: 3+ comorbidities: medical history are also affecting patient's functional outcome.    REHAB POTENTIAL: Good   CLINICAL DECISION MAKING: Evolving/moderate complexity   EVALUATION COMPLEXITY: Moderate     GOALS: Goals reviewed with patient? Yes   SHORT TERM GOALS: Target date: 05/13/2024     Pt will be independent with HEP in order to improve activity tolerance.    Baseline: Goal status: INITIAL   2.  Pt will be independent with use of squatty potty, relaxed toileting mechanics, and improved bowel movement techniques in order to increase ease of bowel movements and complete evacuation.      Baseline:  Goal status: INITIAL   3.  Pt will be independent with self-abdominal mobilization.    Baseline:  Goal status: INITIAL   4.  Pt will be able to teach back and utilize urge suppression technique in order to help reduce number of trips to the bathroom.     Baseline:  Goal status: INITIAL   5.  Pt will be independent with daily vaginal moisturizing to help improve pain and tissue quality.  Baseline:  Goal status: INITIAL   6.  Patient will report 25% improvement in perineal pain in order to increase activity tolerance.    Baseline: 4/10 Goal status: INITIAL   LONG TERM GOALS: Target date: 07/08/2024   Pt will be independent with advanced HEP in order to improve activity tolerance.    Baseline:  Goal status: INITIAL   2.  Pt will report at least 5 bowel movements a week with complete emptying, Bristol Stool Scale 4-5, and no pain. Baseline:  Goal status: INITIAL   3.  Pt will  demonstrate normal pelvic floor muscle tone and A/ROM, able to achieve 3/5 strength with contractions and 10 sec endurance, in order to reduce urinary leaking and number of pads patient wears.    Baseline:  Goal status: INITIAL   4.  Pt will be able to sit for >30 minutes without increased perineal pain in order to go to meetings without difficulty.  Baseline:  Goal status: INITIAL   5.  Pt will be able to stand for >30 minutes without increased perineal pain in order to go to meetings without difficulty.  Baseline:  Goal status: INITIAL   6.  Pt will be able to get into and out of car without pain or difficulty.  Baseline:  Goal status: INITIAL   7. Patient will report 75% improvement in perineal pain in order to increase activity tolerance.              Baseline:             Goal status: INITIAL   8. Pt will be able to go 2-3 hours in between voids without urgency or incontinence in order to improve QOL and perform all functional activities with less difficulty.              Baseline:             Goal status: INITIAL     PLAN:   PT FREQUENCY: 1-2x/week   PT DURATION: 16 visits     PLANNED INTERVENTIONS: 97164- PT Re-evaluation, 97110-Therapeutic exercises, 97530- Therapeutic activity, 97112- Neuromuscular re-education, [phone]- Self Care, 02859- Manual therapy, [phone]- Gait training, [phone]- Aquatic Therapy, [phone]- Electrical stimulation (unattended), [phone]- Traction (mechanical), [phone]- Ionotophoresis 4mg /ml Dexamethasone , 79439 (1-2 muscles), [id number] (3+ muscles)- Dry Needling, Patient/Family education, Balance training, Taping, Joint mobilization, Joint manipulation, Spinal manipulation, Spinal mobilization, Scar mobilization, Vestibular training, Cryotherapy, Moist heat, and Biofeedback   PLAN FOR NEXT SESSION: vaginal pelvic floor muscle assessment to patient tolerance to assess for pelvic floor muscle strength, ROM, scar tissue restriction, tissue quality, and coordination;  continue to discuss healthy bowel and bladder habits, urge drill and bladder retraining; manual techniques to abdomen; mobility activities, strengthening   Josette Mares, PT, DPT01/28/263:48 PM [facility] [address] [place], [place] [address] Phone # [phone] Fax [phone]   "

## 2024-04-21 NOTE — Patient Instructions (Signed)
" ° °  Urge Drill  When you feel an urge to go, follow these steps to regain control: Stop what you are doing and be still Take one deep breath, directing your air into your abdomen Think an affirming thought, such as I've got this. Do 5 quick flicks of your pelvic floor Walk with control to the bathroom to void, or delay voiding    Double-voiding:  This technique is to help with post-void dribbling, or leaking a little bit when you stand up right after urinating. Use relaxed toileting mechanics to urinate as much as you feel like you have to without straining. Sit back upright from leaning forward and relax this way for 10-20 seconds. Lean forward again to finish voiding any amount more.     "

## 2024-04-26 NOTE — Assessment & Plan Note (Signed)
 cT3N1M0, stage IIIC -Presented with rectal pain and colonoscopy showed a 3 cm mass in the anus.  Biopsy confirmed squamous cell carcinoma. - Staging PET scan showed intense of hypermetabolic activity extending from the anus into the rectum, and the small bilateral inguinal lymph nodes with low-level activity.  No evidence of distant metastasis. -She started concurrent chemoradiation with mitomycin  and 5-FU on September 15, 2023 and completed on 10/24/2023 -she was hospitalized for PE, neutropenic fever and anemia on 10/22/23, last two doses RT was cancelled.  - Endoscopic exam by Dr. [name] on December 31, 2023 was negative for residual disease -Restaging PET scan on January 19, 2024 showed a near complete response with residual SUV 4.4 at the anal canal, probably related to treatment.

## 2024-04-27 ENCOUNTER — Inpatient Hospital Stay: Payer: Medicare (Managed Care) | Admitting: Hematology

## 2024-04-27 ENCOUNTER — Inpatient Hospital Stay: Payer: Medicare (Managed Care) | Attending: Nurse Practitioner

## 2024-04-27 VITALS — BP 130/78 | HR 90 | Temp 97.4°F | Resp 16 | Ht 63.0 in | Wt 154.3 lb

## 2024-04-27 DIAGNOSIS — C210 Malignant neoplasm of anus, unspecified: Secondary | ICD-10-CM

## 2024-04-27 LAB — CMP (CANCER CENTER ONLY)
ALT: 13 U/L (ref 0–44)
AST: 19 U/L (ref 15–41)
Albumin: 4.4 g/dL (ref 3.5–5.0)
Alkaline Phosphatase: 103 U/L (ref 38–126)
Anion gap: 11 (ref 5–15)
BUN: 15 mg/dL (ref 8–23)
CO2: 23 mmol/L (ref 22–32)
Calcium: 9.8 mg/dL (ref 8.9–10.3)
Chloride: 102 mmol/L (ref 98–111)
Creatinine: 1.09 mg/dL — ABNORMAL HIGH (ref 0.44–1.00)
GFR, Estimated: 55 mL/min — ABNORMAL LOW
Glucose, Bld: 97 mg/dL (ref 70–99)
Potassium: 4.1 mmol/L (ref 3.5–5.1)
Sodium: 136 mmol/L (ref 135–145)
Total Bilirubin: 0.2 mg/dL (ref 0.0–1.2)
Total Protein: 7.4 g/dL (ref 6.5–8.1)

## 2024-04-27 LAB — CBC WITH DIFFERENTIAL (CANCER CENTER ONLY)
Abs Immature Granulocytes: 0.05 10*3/uL (ref 0.00–0.07)
Basophils Absolute: 0 10*3/uL (ref 0.0–0.1)
Basophils Relative: 1 %
Eosinophils Absolute: 0.1 10*3/uL (ref 0.0–0.5)
Eosinophils Relative: 3 %
HCT: 36.6 % (ref 36.0–46.0)
Hemoglobin: 12 g/dL (ref 12.0–15.0)
Immature Granulocytes: 1 %
Lymphocytes Relative: 13 %
Lymphs Abs: 0.6 10*3/uL — ABNORMAL LOW (ref 0.7–4.0)
MCH: 31.3 pg (ref 26.0–34.0)
MCHC: 32.8 g/dL (ref 30.0–36.0)
MCV: 95.3 fL (ref 80.0–100.0)
Monocytes Absolute: 0.6 10*3/uL (ref 0.1–1.0)
Monocytes Relative: 13 %
Neutro Abs: 3.5 10*3/uL (ref 1.7–7.7)
Neutrophils Relative %: 69 %
Platelet Count: 231 10*3/uL (ref 150–400)
RBC: 3.84 MIL/uL — ABNORMAL LOW (ref 3.87–5.11)
RDW: 13.2 % (ref 11.5–15.5)
WBC Count: 5 10*3/uL (ref 4.0–10.5)
nRBC: 0 % (ref 0.0–0.2)

## 2024-04-28 ENCOUNTER — Encounter: Payer: Self-pay | Admitting: Hematology

## 2024-05-04 ENCOUNTER — Ambulatory Visit: Payer: Self-pay

## 2024-05-13 ENCOUNTER — Ambulatory Visit: Payer: Medicare (Managed Care)

## 2024-05-20 ENCOUNTER — Ambulatory Visit: Payer: Medicare (Managed Care)

## 2024-05-27 ENCOUNTER — Ambulatory Visit: Payer: Medicare (Managed Care)

## 2024-06-03 ENCOUNTER — Ambulatory Visit: Payer: Medicare (Managed Care)
